# Patient Record
Sex: Female | Born: 1960 | Race: White | Hispanic: No | Marital: Married | Smoking: Never smoker
Health system: Southern US, Community
[De-identification: ages and names within clinical notes are randomized; demographics above are authoritative.]

## PROBLEM LIST (undated history)

## (undated) DIAGNOSIS — K219 Gastro-esophageal reflux disease without esophagitis: Secondary | ICD-10-CM

## (undated) DIAGNOSIS — B019 Varicella without complication: Secondary | ICD-10-CM

## (undated) DIAGNOSIS — T8859XA Other complications of anesthesia, initial encounter: Secondary | ICD-10-CM

## (undated) DIAGNOSIS — K563 Gallstone ileus: Secondary | ICD-10-CM

## (undated) DIAGNOSIS — K5900 Constipation, unspecified: Secondary | ICD-10-CM

## (undated) DIAGNOSIS — T4145XA Adverse effect of unspecified anesthetic, initial encounter: Secondary | ICD-10-CM

## (undated) DIAGNOSIS — M1990 Unspecified osteoarthritis, unspecified site: Secondary | ICD-10-CM

## (undated) DIAGNOSIS — E669 Obesity, unspecified: Secondary | ICD-10-CM

## (undated) DIAGNOSIS — I10 Essential (primary) hypertension: Secondary | ICD-10-CM

## (undated) HISTORY — DX: Gastro-esophageal reflux disease without esophagitis: K21.9

## (undated) HISTORY — PX: HERNIA REPAIR: SHX51

## (undated) HISTORY — PX: WISDOM TOOTH EXTRACTION: SHX21

## (undated) HISTORY — PX: TONSILLECTOMY: SUR1361

## (undated) HISTORY — DX: Varicella without complication: B01.9

---

## 2004-03-19 LAB — HM PAP SMEAR: HM Pap smear: NORMAL

## 2015-01-28 ENCOUNTER — Encounter (HOSPITAL_COMMUNITY): Payer: Self-pay | Admitting: Emergency Medicine

## 2015-01-28 ENCOUNTER — Emergency Department (HOSPITAL_COMMUNITY)
Admission: EM | Admit: 2015-01-28 | Discharge: 2015-01-28 | Disposition: A | Discharge status: Home / Self Care | Payer: BLUE CROSS/BLUE SHIELD | Attending: Emergency Medicine | Admitting: Emergency Medicine

## 2015-01-28 DIAGNOSIS — R1011 Right upper quadrant pain: Secondary | ICD-10-CM | POA: Insufficient documentation

## 2015-01-28 DIAGNOSIS — R1010 Upper abdominal pain, unspecified: Secondary | ICD-10-CM

## 2015-01-28 DIAGNOSIS — R109 Unspecified abdominal pain: Secondary | ICD-10-CM | POA: Diagnosis present

## 2015-01-28 DIAGNOSIS — R1114 Bilious vomiting: Secondary | ICD-10-CM | POA: Diagnosis not present

## 2015-01-28 DIAGNOSIS — R1012 Left upper quadrant pain: Secondary | ICD-10-CM | POA: Diagnosis not present

## 2015-01-28 LAB — CBC WITH DIFFERENTIAL/PLATELET
Basophils Absolute: 0 10*3/uL (ref 0.0–0.1)
Basophils Relative: 0 % (ref 0–1)
Eosinophils Absolute: 0 10*3/uL (ref 0.0–0.7)
Eosinophils Relative: 1 % (ref 0–5)
HCT: 46 % (ref 36.0–46.0)
Hemoglobin: 15.9 g/dL — ABNORMAL HIGH (ref 12.0–15.0)
Lymphocytes Relative: 12 % (ref 12–46)
Lymphs Abs: 0.9 10*3/uL (ref 0.7–4.0)
MCH: 31.4 pg (ref 26.0–34.0)
MCHC: 34.6 g/dL (ref 30.0–36.0)
MCV: 90.9 fL (ref 78.0–100.0)
MONOS PCT: 10 % (ref 3–12)
Monocytes Absolute: 0.8 10*3/uL (ref 0.1–1.0)
Neutro Abs: 5.7 10*3/uL (ref 1.7–7.7)
Neutrophils Relative %: 77 % (ref 43–77)
Platelets: 226 10*3/uL (ref 150–400)
RBC: 5.06 MIL/uL (ref 3.87–5.11)
RDW: 12.8 % (ref 11.5–15.5)
Smear Review: ADEQUATE
WBC: 7.4 10*3/uL (ref 4.0–10.5)

## 2015-01-28 LAB — COMPREHENSIVE METABOLIC PANEL
ALBUMIN: 4.7 g/dL (ref 3.5–5.2)
ALK PHOS: 74 U/L (ref 39–117)
ALT: 32 U/L (ref 0–35)
AST: 30 U/L (ref 0–37)
Anion gap: 11 (ref 5–15)
BILIRUBIN TOTAL: 1.1 mg/dL (ref 0.3–1.2)
BUN: 23 mg/dL (ref 6–23)
CALCIUM: 10.6 mg/dL — AB (ref 8.4–10.5)
CHLORIDE: 101 mmol/L (ref 96–112)
CO2: 26 mmol/L (ref 19–32)
Creatinine, Ser: 0.85 mg/dL (ref 0.50–1.10)
GFR calc Af Amer: 89 mL/min — ABNORMAL LOW (ref >90)
GFR calc non Af Amer: 77 mL/min — ABNORMAL LOW (ref >90)
Glucose, Bld: 175 mg/dL — ABNORMAL HIGH (ref 70–99)
Potassium: 3.6 mmol/L (ref 3.5–5.1)
Sodium: 138 mmol/L (ref 135–145)
Total Protein: 8.8 g/dL — ABNORMAL HIGH (ref 6.0–8.3)

## 2015-01-28 LAB — URINALYSIS, ROUTINE W REFLEX MICROSCOPIC
Glucose, UA: NEGATIVE mg/dL
Leukocytes, UA: NEGATIVE
Nitrite: NEGATIVE
Protein, ur: 30 mg/dL — AB
SPECIFIC GRAVITY, URINE: 1.025 (ref 1.005–1.030)
Urobilinogen, UA: 0.2 mg/dL (ref 0.0–1.0)
pH: 6 (ref 5.0–8.0)

## 2015-01-28 LAB — URINE MICROSCOPIC-ADD ON

## 2015-01-28 LAB — LIPASE, BLOOD: Lipase: 21 U/L (ref 11–59)

## 2015-01-28 MED ORDER — ONDANSETRON HCL 8 MG PO TABS: 8.0000 mg | ORAL_TABLET | Freq: Three times a day (TID) | ORAL | Status: DC | PRN

## 2015-01-28 MED ORDER — ONDANSETRON HCL 4 MG/2ML IJ SOLN
4.0000 mg | Freq: Once | INTRAMUSCULAR | Status: AC
  Administered 2015-01-28: 4 mg via INTRAVENOUS
  Filled 2015-01-28: qty 2

## 2015-01-28 MED ORDER — SODIUM CHLORIDE 0.9 % IV SOLN
INTRAVENOUS | Status: DC
  Administered 2015-01-28: 10:00:00 via INTRAVENOUS

## 2015-01-28 MED ORDER — SODIUM CHLORIDE 0.9 % IV BOLUS (SEPSIS)
1000.0000 mL | Freq: Once | INTRAVENOUS | Status: AC
  Administered 2015-01-28: 1000 mL via INTRAVENOUS

## 2015-01-28 NOTE — Discharge Instructions (Signed)
Start with clear liquids, then gradually advance her diet over a couple of days. Use Tylenol, for pain or fever. Return here, if needed, for problems.    Abdominal Pain Many things can cause abdominal pain. Usually, abdominal pain is not caused by a disease and will improve without treatment. It can often be observed and treated at home. Your health care provider will do a physical exam and possibly order blood tests and X-rays to help determine the seriousness of your pain. However, in many cases, more time must pass before a clear cause of the pain can be found. Before that point, your health care provider may not know if you need more testing or further treatment. HOME CARE INSTRUCTIONS  Monitor your abdominal pain for any changes. The following actions may help to alleviate any discomfort you are experiencing:  Only take over-the-counter or prescription medicines as directed by your health care provider.  Do not take laxatives unless directed to do so by your health care provider.  Try a clear liquid diet (broth, tea, or water) as directed by your health care provider. Slowly move to a bland diet as tolerated. SEEK MEDICAL CARE IF:  You have unexplained abdominal pain.  You have abdominal pain associated with nausea or diarrhea.  You have pain when you urinate or have a bowel movement.  You experience abdominal pain that wakes you in the night.  You have abdominal pain that is worsened or improved by eating food.  You have abdominal pain that is worsened with eating fatty foods.  You have a fever. SEEK IMMEDIATE MEDICAL CARE IF:   Your pain does not go away within 2 hours.  You keep throwing up (vomiting).  Your pain is felt only in portions of the abdomen, such as the right side or the left lower portion of the abdomen.  You pass bloody or black tarry stools. MAKE SURE YOU:  Understand these instructions.   Will watch your condition.   Will get help right away if  you are not doing well or get worse.  Document Released: 09/04/2005 Document Revised: 11/30/2013 Document Reviewed: 08/04/2013 [facility] Patient Information 2015 [place], [place]. This information is not intended to replace advice given to you by your health care provider. Make sure you discuss any questions you have with your health care provider.  Nausea and Vomiting Nausea is a sick feeling that often comes before throwing up (vomiting). Vomiting is a reflex where stomach contents come out of your mouth. Vomiting can cause severe loss of body fluids (dehydration). Children and elderly adults can become dehydrated quickly, especially if they also have diarrhea. Nausea and vomiting are symptoms of a condition or disease. It is important to find the cause of your symptoms. CAUSES   Direct irritation of the stomach lining. This irritation can result from increased acid production (gastroesophageal reflux disease), infection, food poisoning, taking certain medicines (such as nonsteroidal anti-inflammatory drugs), alcohol use, or tobacco use.  Signals from the brain.These signals could be caused by a headache, heat exposure, an inner ear disturbance, increased pressure in the brain from injury, infection, a tumor, or a concussion, pain, emotional stimulus, or metabolic problems.  An obstruction in the gastrointestinal tract (bowel obstruction).  Illnesses such as diabetes, hepatitis, gallbladder problems, appendicitis, kidney problems, cancer, sepsis, atypical symptoms of a heart attack, or eating disorders.  Medical treatments such as chemotherapy and radiation.  Receiving medicine that makes you sleep (general anesthetic) during surgery. DIAGNOSIS Your caregiver may ask for tests  to be done if the problems do not improve after a few days. Tests may also be done if symptoms are severe or if the reason for the nausea and vomiting is not clear. Tests may include:  Urine tests.  Blood  tests.  Stool tests.  Cultures (to look for evidence of infection).  X-rays or other imaging studies. Test results can help your caregiver make decisions about treatment or the need for additional tests. TREATMENT You need to stay well hydrated. Drink frequently but in small amounts.You may wish to drink water, sports drinks, clear broth, or eat frozen ice pops or gelatin dessert to help stay hydrated.When you eat, eating slowly may help prevent nausea.There are also some antinausea medicines that may help prevent nausea. HOME CARE INSTRUCTIONS   Take all medicine as directed by your caregiver.  If you do not have an appetite, do not force yourself to eat. However, you must continue to drink fluids.  If you have an appetite, eat a normal diet unless your caregiver tells you differently.  Eat a variety of complex carbohydrates (rice, wheat, potatoes, bread), lean meats, yogurt, fruits, and vegetables.  Avoid high-fat foods because they are more difficult to digest.  Drink enough water and fluids to keep your urine clear or pale yellow.  If you are dehydrated, ask your caregiver for specific rehydration instructions. Signs of dehydration may include:  Severe thirst.  Dry lips and mouth.  Dizziness.  Dark urine.  Decreasing urine frequency and amount.  Confusion.  Rapid breathing or pulse. SEEK IMMEDIATE MEDICAL CARE IF:   You have blood or brown flecks (like coffee grounds) in your vomit.  You have black or bloody stools.  You have a severe headache or stiff neck.  You are confused.  You have severe abdominal pain.  You have chest pain or trouble breathing.  You do not urinate at least once every 8 hours.  You develop cold or clammy skin.  You continue to vomit for longer than 24 to 48 hours.  You have a fever. MAKE SURE YOU:   Understand these instructions.  Will watch your condition.  Will get help right away if you are not doing well or get  worse. Document Released: 11/25/2005 Document Revised: 02/17/2012 Document Reviewed: 04/24/2011 [facility] Patient Information 2015 [place], [place]. This information is not intended to replace advice given to you by your health care provider. Make sure you discuss any questions you have with your health care provider.

## 2015-01-28 NOTE — ED Notes (Signed)
Patient c/o mid abd pain that started yesterday. Per patient started as cramping in upper abd yesterday which easied last night. Patient started having pain again with vomiting this morning at 2 am. Patient denies any diarrhea, fevers, or urinary symptoms. Per patient vomiting large amounts of bile.

## 2015-01-28 NOTE — ED Provider Notes (Signed)
CSN: [id number]     Arrival date & time 01/28/15  8101 History  This chart was scribed for Richarda Blade, MD by Jeanell Sparrow, ED Scribe. This patient was seen in room APA06/APA06 and the patient's care was started at 7:37 AM.    Chief Complaint  Patient presents with  . Abdominal Pain   The history is provided by the patient. No language interpreter was used.   HPI Comments: Miranda Aguilar is a 54 y.o. female who presents to the Emergency Department complaining of constant moderate middle abdominal pain that started yesterday. She states that her pain has been waxing and waning with 2 episodes of vomiting. She reports that he pain radiate to her back and shoulders. She reports that her emesis was mainly bile. She reports that throwing up provides relief from her abdominal pain. She denies any prior hx of abdominal pain, or abdominal surgeries. She reports no sick contacts, or hx of smoking. 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 She denies any diarrhea or fever.   History reviewed. No pertinent past medical history. History reviewed. No pertinent past surgical history. Family History  Problem Relation Age of Onset  . Cancer Mother   . Diabetes Mother   . Cancer Father   . Stroke Father    History  Substance Use Topics  . Smoking status: Never Smoker   . Smokeless tobacco: Never Used  . Alcohol Use: No     Comment: ocassional   OB History    Gravida Para Term Preterm AB TAB SAB Ectopic Multiple Living            0     Review of Systems  Constitutional: Negative for fever.  Gastrointestinal: Positive for vomiting and abdominal pain. Negative for diarrhea.  All other systems reviewed and are negative.   Allergies  Review of patient's allergies indicates no known allergies.  Home Medications   Prior to Admission medications   Medication Sig Start Date End Date Taking? Authorizing Provider  ibuprofen (ADVIL,MOTRIN) 200 MG tablet Take 400 mg by mouth every 8  (eight) hours as needed (pain).   Yes Historical Provider, MD  ondansetron (ZOFRAN) 8 MG tablet Take 1 tablet (8 mg total) by mouth every 8 (eight) hours as needed for nausea or vomiting. 01/28/15   Richarda Blade, MD   BP 136/102 mmHg  Pulse 104  Temp(Src) 97.9 F (36.6 C) (Oral)  Resp 22  Ht 5\' 6"  (1.676 m)  Wt 260 lb (117.935 kg)  BMI 41.99 kg/m2  SpO2 96%  LMP 11/22/2014 Physical Exam  Constitutional: She is oriented to person, place, and time. She appears well-developed and well-nourished.  HENT:  Head: Normocephalic and atraumatic.  Mouth/Throat: Oropharynx is clear and moist.  Eyes: Conjunctivae and EOM are normal. Pupils are equal, round, and reactive to light.  Neck: Normal range of motion and phonation normal. Neck supple.  Cardiovascular: Normal rate and regular rhythm.   Pulmonary/Chest: Effort normal and breath sounds normal. She exhibits no tenderness.  Abdominal: Soft. She exhibits no distension and no mass. There is tenderness. There is no rebound and no guarding.  Hypoactive bowel sounds. Mild bilateral upper quadrant TTP.  Genitourinary:  No CVA TTP.   Musculoskeletal: Normal range of motion.  Neurological: She is alert and oriented to person, place, and time. She exhibits normal muscle tone.  Skin: Skin is warm and dry.  Psychiatric: She has a normal mood and affect. Her behavior is normal. Judgment  and thought content normal.  Nursing note and vitals reviewed.   ED Course  Procedures (including critical care time)  Medications  sodium chloride 0.9 % bolus 1,000 mL (0 mLs Intravenous Stopped 01/28/15 0930)  ondansetron (ZOFRAN) injection 4 mg (4 mg Intravenous Given 01/28/15 0802)    Patient Vitals for the past 24 hrs:  BP Temp Temp src Pulse Resp SpO2 Height Weight  01/28/15 1057 131/80 mmHg 97.8 F (36.6 C) Oral 84 18 97 % - -  01/28/15 1000 137/89 mmHg - - 79 - 96 % - -  01/28/15 0930 140/90 mmHg - - 83 - 96 % - -  01/28/15 0900 146/84 mmHg - - 81 -  94 % - -  01/28/15 0840 145/82 mmHg - - 81 18 95 % - -  01/28/15 0657 (!) 136/102 mmHg 97.9 F (36.6 C) Oral 104 22 96 % 5\' 6"  (1.676 m) 260 lb (117.935 kg)    9:35 AM Reevaluation with update and discussion. After initial assessment and treatment, an updated evaluation reveals she has, trauma, now.  She complains of mild soreness in the upper abdomen.  She feels like she is ready to try an oral fluid challenge. Suliman Termini L    DIAGNOSTIC STUDIES: Oxygen Saturation is 96% on RA, normal by my interpretation.    COORDINATION OF CARE: 7:41 AM- Pt advised of plan for treatment which includes medication and labs and pt agrees.  Labs Review Labs Reviewed  CBC WITH DIFFERENTIAL/PLATELET - Abnormal; Notable for the following:    Hemoglobin 15.9 (*)    All other components within normal limits  COMPREHENSIVE METABOLIC PANEL - Abnormal; Notable for the following:    Glucose, Bld 175 (*)    Calcium 10.6 (*)    Total Protein 8.8 (*)    GFR calc non Af Amer 77 (*)    GFR calc Af Amer 89 (*)    All other components within normal limits  URINALYSIS, ROUTINE W REFLEX MICROSCOPIC - Abnormal; Notable for the following:    Color, Urine AMBER (*)    APPearance CLOUDY (*)    Hgb urine dipstick TRACE (*)    Bilirubin Urine SMALL (*)    Ketones, ur TRACE (*)    Protein, ur 30 (*)    All other components within normal limits  URINE MICROSCOPIC-ADD ON - Abnormal; Notable for the following:    Squamous Epithelial / LPF FEW (*)    Bacteria, UA FEW (*)    Casts GRANULAR CAST (*)    All other components within normal limits  LIPASE, BLOOD    Imaging Review No results found.   EKG Interpretation None      MDM   Final diagnoses:  Bilious vomiting with nausea  Upper abdominal pain    Nonspecific, nausea and vomiting, possible early gastroenteritis.  Doubt gallbladder disease, kidney infection or metabolic instability.   Nursing Notes Reviewed/ Care Coordinated Applicable Imaging  Reviewed Interpretation of Laboratory Data incorporated into ED treatment  The patient appears reasonably screened and/or stabilized for discharge and I doubt any other medical condition or other [facility] requiring further screening, evaluation, or treatment in the ED at this time prior to discharge.  Plan: Home Medications- Zofran; Home Treatments- rest; return here if the recommended treatment, does not improve the symptoms; Recommended follow up- PCP prn  I personally performed the services described in this documentation, which was scribed in my presence. The recorded information has been reviewed and is accurate.  Richarda Blade, MD 01/28/15 [phone]

## 2015-01-29 ENCOUNTER — Encounter (HOSPITAL_COMMUNITY): Payer: Self-pay | Admitting: Emergency Medicine

## 2015-01-29 ENCOUNTER — Emergency Department (HOSPITAL_COMMUNITY): Payer: BLUE CROSS/BLUE SHIELD

## 2015-01-29 ENCOUNTER — Inpatient Hospital Stay (HOSPITAL_COMMUNITY)
Admission: EM | Admit: 2015-01-29 | Discharge: 2015-02-05 | DRG: 348 | Disposition: A | Discharge status: Home / Self Care | Payer: BLUE CROSS/BLUE SHIELD | Attending: General Surgery | Admitting: General Surgery

## 2015-01-29 DIAGNOSIS — Z823 Family history of stroke: Secondary | ICD-10-CM

## 2015-01-29 DIAGNOSIS — K429 Umbilical hernia without obstruction or gangrene: Secondary | ICD-10-CM | POA: Diagnosis present

## 2015-01-29 DIAGNOSIS — K563 Gallstone ileus: Secondary | ICD-10-CM | POA: Diagnosis present

## 2015-01-29 DIAGNOSIS — I10 Essential (primary) hypertension: Secondary | ICD-10-CM

## 2015-01-29 DIAGNOSIS — Z833 Family history of diabetes mellitus: Secondary | ICD-10-CM | POA: Diagnosis not present

## 2015-01-29 DIAGNOSIS — Q430 Meckel's diverticulum (displaced) (hypertrophic): Secondary | ICD-10-CM

## 2015-01-29 DIAGNOSIS — E669 Obesity, unspecified: Secondary | ICD-10-CM | POA: Diagnosis present

## 2015-01-29 DIAGNOSIS — R739 Hyperglycemia, unspecified: Secondary | ICD-10-CM | POA: Diagnosis present

## 2015-01-29 DIAGNOSIS — R52 Pain, unspecified: Secondary | ICD-10-CM

## 2015-01-29 DIAGNOSIS — R109 Unspecified abdominal pain: Secondary | ICD-10-CM

## 2015-01-29 DIAGNOSIS — R1010 Upper abdominal pain, unspecified: Secondary | ICD-10-CM

## 2015-01-29 DIAGNOSIS — Z6841 Body Mass Index (BMI) 40.0 and over, adult: Secondary | ICD-10-CM

## 2015-01-29 DIAGNOSIS — K56609 Unspecified intestinal obstruction, unspecified as to partial versus complete obstruction: Secondary | ICD-10-CM

## 2015-01-29 DIAGNOSIS — E860 Dehydration: Secondary | ICD-10-CM | POA: Diagnosis present

## 2015-01-29 DIAGNOSIS — R112 Nausea with vomiting, unspecified: Secondary | ICD-10-CM | POA: Diagnosis present

## 2015-01-29 DIAGNOSIS — N179 Acute kidney failure, unspecified: Secondary | ICD-10-CM | POA: Diagnosis present

## 2015-01-29 DIAGNOSIS — E876 Hypokalemia: Secondary | ICD-10-CM | POA: Diagnosis not present

## 2015-01-29 HISTORY — DX: Essential (primary) hypertension: I10

## 2015-01-29 HISTORY — DX: Obesity, unspecified: E66.9

## 2015-01-29 HISTORY — DX: Gallstone ileus: K56.3

## 2015-01-29 LAB — URINALYSIS, ROUTINE W REFLEX MICROSCOPIC
Glucose, UA: NEGATIVE mg/dL
KETONES UR: NEGATIVE mg/dL
Leukocytes, UA: NEGATIVE
Nitrite: NEGATIVE
PH: 5.5 (ref 5.0–8.0)
Protein, ur: >300 mg/dL — AB
Urobilinogen, UA: 0.2 mg/dL (ref 0.0–1.0)

## 2015-01-29 LAB — URINE MICROSCOPIC-ADD ON

## 2015-01-29 LAB — CBC WITH DIFFERENTIAL/PLATELET
Basophils Absolute: 0 10*3/uL (ref 0.0–0.1)
Basophils Relative: 0 % (ref 0–1)
Eosinophils Absolute: 0 10*3/uL (ref 0.0–0.7)
Eosinophils Relative: 0 % (ref 0–5)
HCT: 50 % — ABNORMAL HIGH (ref 36.0–46.0)
Hemoglobin: 17.5 g/dL — ABNORMAL HIGH (ref 12.0–15.0)
LYMPHS ABS: 0.9 10*3/uL (ref 0.7–4.0)
LYMPHS PCT: 25 % (ref 12–46)
MCH: 31.6 pg (ref 26.0–34.0)
MCHC: 35 g/dL (ref 30.0–36.0)
MCV: 90.3 fL (ref 78.0–100.0)
MONO ABS: 0.6 10*3/uL (ref 0.1–1.0)
Monocytes Relative: 17 % — ABNORMAL HIGH (ref 3–12)
NEUTROS PCT: 58 % (ref 43–77)
Neutro Abs: 2.1 10*3/uL (ref 1.7–7.7)
PLATELETS: 297 10*3/uL (ref 150–400)
RBC: 5.54 MIL/uL — ABNORMAL HIGH (ref 3.87–5.11)
RDW: 12.8 % (ref 11.5–15.5)
WBC: 3.7 10*3/uL — ABNORMAL LOW (ref 4.0–10.5)

## 2015-01-29 LAB — COMPREHENSIVE METABOLIC PANEL
ALK PHOS: 68 U/L (ref 39–117)
ALT: 28 U/L (ref 0–35)
AST: 22 U/L (ref 0–37)
Albumin: 5 g/dL (ref 3.5–5.2)
Anion gap: 15 (ref 5–15)
BUN: 46 mg/dL — ABNORMAL HIGH (ref 6–23)
CALCIUM: 10.6 mg/dL — AB (ref 8.4–10.5)
CHLORIDE: 88 mmol/L — AB (ref 96–112)
CO2: 33 mmol/L — ABNORMAL HIGH (ref 19–32)
Creatinine, Ser: 2.78 mg/dL — ABNORMAL HIGH (ref 0.50–1.10)
GFR calc Af Amer: 21 mL/min — ABNORMAL LOW (ref >90)
GFR calc non Af Amer: 18 mL/min — ABNORMAL LOW (ref >90)
Glucose, Bld: 205 mg/dL — ABNORMAL HIGH (ref 70–99)
Potassium: 3.1 mmol/L — ABNORMAL LOW (ref 3.5–5.1)
SODIUM: 136 mmol/L (ref 135–145)
TOTAL PROTEIN: 10.2 g/dL — AB (ref 6.0–8.3)
Total Bilirubin: 1 mg/dL (ref 0.3–1.2)

## 2015-01-29 MED ORDER — PIPERACILLIN-TAZOBACTAM 3.375 G IVPB 30 MIN
3.3750 g | Freq: Once | INTRAVENOUS | Status: AC
  Administered 2015-01-29: 3.375 g via INTRAVENOUS
  Filled 2015-01-29: qty 50

## 2015-01-29 MED ORDER — PIPERACILLIN-TAZOBACTAM 3.375 G IVPB
INTRAVENOUS | Status: AC
  Filled 2015-01-29: qty 50

## 2015-01-29 MED ORDER — MORPHINE SULFATE 4 MG/ML IJ SOLN
4.0000 mg | INTRAMUSCULAR | Status: DC | PRN
  Administered 2015-01-29 – 2015-01-30 (×3): 4 mg via INTRAVENOUS
  Filled 2015-01-29 (×3): qty 1

## 2015-01-29 MED ORDER — ONDANSETRON HCL 4 MG PO TABS
4.0000 mg | ORAL_TABLET | Freq: Four times a day (QID) | ORAL | Status: DC | PRN
  Filled 2015-01-29: qty 1

## 2015-01-29 MED ORDER — SODIUM CHLORIDE 0.9 % IV SOLN
INTRAVENOUS | Status: DC
Start: 2015-01-29 — End: 2015-01-31
  Administered 2015-01-29 – 2015-01-31 (×4): via INTRAVENOUS

## 2015-01-29 MED ORDER — IOHEXOL 300 MG/ML  SOLN
100.0000 mL | Freq: Once | INTRAMUSCULAR | Status: AC | PRN
  Administered 2015-01-29: 100 mL via INTRAVENOUS

## 2015-01-29 MED ORDER — SODIUM CHLORIDE 0.9 % IV SOLN: INTRAVENOUS | Status: DC

## 2015-01-29 MED ORDER — HYDROMORPHONE HCL 1 MG/ML IJ SOLN
1.0000 mg | Freq: Once | INTRAMUSCULAR | Status: AC
Start: 2015-01-29 — End: 2015-01-29
  Administered 2015-01-29: 1 mg via INTRAVENOUS
  Filled 2015-01-29: qty 1

## 2015-01-29 MED ORDER — CETYLPYRIDINIUM CHLORIDE 0.05 % MT LIQD
7.0000 mL | Freq: Two times a day (BID) | OROMUCOSAL | Status: DC
  Administered 2015-01-29 – 2015-02-05 (×13): 7 mL via OROMUCOSAL

## 2015-01-29 MED ORDER — POTASSIUM CHLORIDE CRYS ER 20 MEQ PO TBCR
40.0000 meq | EXTENDED_RELEASE_TABLET | Freq: Once | ORAL | Status: AC
  Administered 2015-01-29: 40 meq via ORAL

## 2015-01-29 MED ORDER — PANTOPRAZOLE SODIUM 40 MG IV SOLR
40.0000 mg | Freq: Once | INTRAVENOUS | Status: AC
  Administered 2015-01-29: 40 mg via INTRAVENOUS
  Filled 2015-01-29: qty 40

## 2015-01-29 MED ORDER — PIPERACILLIN-TAZOBACTAM 3.375 G IVPB
3.3750 g | Freq: Three times a day (TID) | INTRAVENOUS | Status: DC
  Administered 2015-01-30 – 2015-02-04 (×15): 3.375 g via INTRAVENOUS
  Filled 2015-01-29 (×23): qty 50

## 2015-01-29 MED ORDER — ONDANSETRON HCL 4 MG/2ML IJ SOLN
4.0000 mg | Freq: Once | INTRAMUSCULAR | Status: AC
  Administered 2015-01-29: 4 mg via INTRAVENOUS
  Filled 2015-01-29: qty 2

## 2015-01-29 MED ORDER — ENOXAPARIN SODIUM 30 MG/0.3ML ~~LOC~~ SOLN
30.0000 mg | SUBCUTANEOUS | Status: DC
  Administered 2015-01-29: 30 mg via SUBCUTANEOUS
  Filled 2015-01-29: qty 0.3

## 2015-01-29 MED ORDER — IOHEXOL 300 MG/ML  SOLN
25.0000 mL | Freq: Once | INTRAMUSCULAR | Status: AC | PRN
  Administered 2015-01-29: 25 mL via ORAL

## 2015-01-29 MED ORDER — ONDANSETRON HCL 4 MG/2ML IJ SOLN
4.0000 mg | Freq: Four times a day (QID) | INTRAMUSCULAR | Status: DC | PRN
  Administered 2015-01-29 – 2015-02-04 (×11): 4 mg via INTRAVENOUS
  Filled 2015-01-29 (×13): qty 2

## 2015-01-29 NOTE — ED Notes (Signed)
Having vomiting and abdominal pain for last 2 days.  Rates 5.  Took Zofran with no relief.

## 2015-01-29 NOTE — Progress Notes (Signed)
ANTIBIOTIC CONSULT NOTE - INITIAL  Pharmacy Consult for Zosyn Indication: Intra abdominal infection  No Known Allergies  Patient Measurements: Height: 5\' 6"  (167.6 cm) Weight: 260 lb (117.935 kg) IBW/kg (Calculated) : 59.3 Adjusted Body Weight:   Vital Signs: Temp: 97.5 F (36.4 C) (02/21 1709) Temp Source: Oral (02/21 1709) BP: 124/105 mmHg (02/21 1940) Pulse Rate: 106 (02/21 1940) Intake/Output from previous day:   Intake/Output from this shift:    Labs:  Recent Labs  01/28/15 0720 01/29/15 1920  WBC 7.4 3.7*  HGB 15.9* 17.5*  PLT 226 297  CREATININE 0.85 2.78*   Estimated Creatinine Clearance: 30.6 mL/min (by C-G formula based on Cr of 2.78). No results for input(s): VANCOTROUGH, VANCOPEAK, VANCORANDOM, GENTTROUGH, GENTPEAK, GENTRANDOM, TOBRATROUGH, TOBRAPEAK, TOBRARND, AMIKACINPEAK, AMIKACINTROU, AMIKACIN in the last 72 hours.   Microbiology: No results found for this or any previous visit (from the past 720 hour(s)).  Medical History: History reviewed. No pertinent past medical history.  Medications:   (Not in a hospital admission) Assessment: History of abdominal pain associated with vomiting. Evaluation in the emergency room showed that there appears to be a gallstone in the small bowel causing a degree of obstruction. Surgery consulted. CrCl > 20 ml/min Zosyn 3.375 GM IV  X 1 dose given  Goal of Therapy:  Eradicate infection  Plan:  Zosyn 3.375 GM IV every 8 hours, infuse each dose over 4 hours Monitor renal function Labs per protocol   Abner Greenspan, Karron Alvizo Bennett 01/29/2015,10:09 PM

## 2015-01-29 NOTE — ED Provider Notes (Signed)
CSN: [id number]     Arrival date & time 01/29/15  1705 History   This chart was scribed for Maudry Diego, MD by Randa Evens, ED Scribe. This patient was seen in room APA12/APA12 and the patient's care was started at 7:05 PM.      Chief Complaint  Patient presents with  . Emesis  . Abdominal Pain    Patient is a 54 y.o. female presenting with vomiting and abdominal pain. The history is provided by the patient. No language interpreter was used.  Emesis Severity:  Moderate Duration:  2 days Timing:  Intermittent Progression:  Unchanged Relieved by:  Nothing Worsened by:  Nothing tried Ineffective treatments:  Antiemetics Associated symptoms: abdominal pain   Associated symptoms: no diarrhea, no fever and no headaches   Abdominal Pain Associated symptoms: vomiting   Associated symptoms: no chest pain, no cough, no diarrhea, no fatigue and no hematuria    HPI Comments: Miranda Aguilar is a 54 y.o. female who presents to the Emergency Department complaining of un improving  vomiting onset 2 days prior. Pt states she has associated abdominal pain. Pt states she was prescribed Zofran that has not provided any relief. Pt denies diarrhea or other related symptoms.   History reviewed. No pertinent past medical history. History reviewed. No pertinent past surgical history. Family History  Problem Relation Age of Onset  . Cancer Mother   . Diabetes Mother   . Cancer Father   . Stroke Father    History  Substance Use Topics  . Smoking status: Never Smoker   . Smokeless tobacco: Never Used  . Alcohol Use: No     Comment: ocassional   OB History    Gravida Para Term Preterm AB TAB SAB Ectopic Multiple Living            0      Review of Systems  Constitutional: Negative for appetite change and fatigue.  HENT: Negative for congestion, ear discharge and sinus pressure.   Eyes: Negative for discharge.  Respiratory: Negative for cough.   Cardiovascular: Negative for chest  pain.  Gastrointestinal: Positive for vomiting and abdominal pain. Negative for diarrhea.  Genitourinary: Negative for frequency and hematuria.  Musculoskeletal: Negative for back pain.  Skin: Negative for rash.  Neurological: Negative for seizures and headaches.  Psychiatric/Behavioral: Negative for hallucinations.      Allergies  Review of patient's allergies indicates no known allergies.  Home Medications   Prior to Admission medications   Medication Sig Start Date End Date Taking? Authorizing Provider  ibuprofen (ADVIL,MOTRIN) 200 MG tablet Take 400 mg by mouth every 8 (eight) hours as needed (pain).    Historical Provider, MD  ondansetron (ZOFRAN) 8 MG tablet Take 1 tablet (8 mg total) by mouth every 8 (eight) hours as needed for nausea or vomiting. 01/28/15   Richarda Blade, MD   BP 139/103 mmHg  Pulse 117  Temp(Src) 97.5 F (36.4 C) (Oral)  Resp 19  Ht 5\' 6"  (1.676 m)  Wt 260 lb (117.935 kg)  BMI 41.99 kg/m2  SpO2 94%  LMP 11/22/2014   Physical Exam  Constitutional: She is oriented to person, place, and time. She appears well-developed.  HENT:  Head: Normocephalic.  Eyes: Conjunctivae and EOM are normal. No scleral icterus.  Neck: Neck supple. No thyromegaly present.  Cardiovascular: Normal rate and regular rhythm.  Exam reveals no gallop and no friction rub.   No murmur heard. Pulmonary/Chest: No stridor. She has no wheezes. She has  no rales. She exhibits no tenderness.  Abdominal: She exhibits no distension. There is tenderness (mild) in the epigastric area. There is no rebound.  Musculoskeletal: Normal range of motion. She exhibits no edema.  Lymphadenopathy:    She has no cervical adenopathy.  Neurological: She is oriented to person, place, and time. She exhibits normal muscle tone. Coordination normal.  Skin: No rash noted. No erythema.  Psychiatric: She has a normal mood and affect. Her behavior is normal.  Nursing note and vitals reviewed.   ED Course   Procedures (including critical care time) DIAGNOSTIC STUDIES: Oxygen Saturation is 94% on RA, adequate by my interpretation.    COORDINATION OF CARE: 7:10 PM-Discussed treatment plan with pt at bedside and pt agreed to plan.     Labs Review Labs Reviewed  CBC WITH DIFFERENTIAL/PLATELET  BASIC METABOLIC PANEL  URINALYSIS, ROUTINE W REFLEX MICROSCOPIC    Imaging Review No results found.   EKG Interpretation None      MDM   Final diagnoses:  None      Gall stone in small bowl,   Admit to medicne,  Surgery consulted   The chart was scribed for me under my direct supervision.  I personally performed the history, physical, and medical decision making and all procedures in the evaluation of this patient.Maudry Diego, MD 01/29/15 [phone]

## 2015-01-29 NOTE — H&P (Signed)
[facility] History and Physical  Miranda Aguilar DZH:[id number] DOB: February 26, 1961 DOA: 01/29/2015  Referring physician: ER PCP: No PCP Per Patient   Chief Complaint: Nausea, vomiting, abdominal pain  HPI: Miranda Aguilar is a 54 y.o. female  This is a 54 year old lady who gives a 2-1/2 day history of abdominal pain associated with vomiting. The abdominal pain is colicky in nature, intermittent throughout the last couple of days and she has been vomiting also. There has been no diarrhea or fever. No headaches. She doesn't seem to be improving. There is no chest pain or dyspnea. Evaluation in the emergency room showed that there appears to be a gallstone in the small bowel causing a degree of obstruction. She is now being admitted for further management.   Review of Systems:  Apart from symptoms above, all systems negative.  History reviewed. No pertinent past medical history. History reviewed. No pertinent past surgical history. Social History:  reports that she has never smoked. She has never used smokeless tobacco. She reports that she does not drink alcohol or use illicit drugs.  No Known Allergies  Family History  Problem Relation Age of Onset  . Cancer Mother   . Diabetes Mother   . Cancer Father   . Stroke Father      Prior to Admission medications   Medication Sig Start Date End Date Taking? Authorizing Provider  ibuprofen (ADVIL,MOTRIN) 200 MG tablet Take 400 mg by mouth every 8 (eight) hours as needed (pain).   Yes Historical Provider, MD  ondansetron (ZOFRAN) 8 MG tablet Take 1 tablet (8 mg total) by mouth every 8 (eight) hours as needed for nausea or vomiting. 01/28/15  Yes Richarda Blade, MD   Physical Exam: Filed Vitals:   01/29/15 1709 01/29/15 1940  BP: 139/103 124/105  Pulse: 117 106  Temp: 97.5 F (36.4 C)   TempSrc: Oral   Resp: 19 24  Height: 5\' 6"  (1.676 m)   Weight: 117.935 kg (260 lb)   SpO2: 94% 94%    Wt Readings from Last 3 Encounters:    01/29/15 117.935 kg (260 lb)  01/28/15 117.935 kg (260 lb)    General:  Appears in some discomfort although she has had analgesia. She is not toxic or septic clinically. She is dehydrated. Eyes: PERRL, normal lids, irises & conjunctiva ENT: grossly normal hearing, lips & tongue Neck: no LAD, masses or thyromegaly Cardiovascular: RRR, no m/r/g. No LE edema. Telemetry: SR, no arrhythmias  Respiratory: CTA bilaterally, no w/r/r. Normal respiratory effort. Abdomen: soft, ntnd. Minimal tenderness in the upper abdomen in a generalized fashion. Bowel sounds are very not heard. : Skin: no rash or induration seen on limited exam Musculoskeletal: grossly normal tone BUE/BLE Psychiatric: grossly normal mood and affect, speech fluent and appropriate Neurologic: grossly non-focal.          Labs on Admission:  Basic Metabolic Panel:  Recent Labs Lab 01/28/15 0720 01/29/15 1920  NA 138 136  K 3.6 3.1*  CL 101 88*  CO2 26 33*  GLUCOSE 175* 205*  BUN 23 46*  CREATININE 0.85 2.78*  CALCIUM 10.6* 10.6*   Liver Function Tests:  Recent Labs Lab 01/28/15 0720 01/29/15 1920  AST 30 22  ALT 32 28  ALKPHOS 74 68  BILITOT 1.1 1.0  PROT 8.8* 10.2*  ALBUMIN 4.7 5.0    Recent Labs Lab 01/28/15 0720  LIPASE 21   No results for input(s): AMMONIA in the last 168 hours. CBC:  Recent Labs Lab 01/28/15  0720 01/29/15 1920  WBC 7.4 3.7*  NEUTROABS 5.7 2.1  HGB 15.9* 17.5*  HCT 46.0 50.0*  MCV 90.9 90.3  PLT 226 297   Cardiac Enzymes: No results for input(s): CKTOTAL, CKMB, CKMBINDEX, TROPONINI in the last 168 hours.  BNP (last 3 results) No results for input(s): BNP in the last 8760 hours.  ProBNP (last 3 results) No results for input(s): PROBNP in the last 8760 hours.  CBG: No results for input(s): GLUCAP in the last 168 hours.  Radiological Exams on Admission: Ct Abdomen Pelvis W Contrast  01/29/2015   CLINICAL DATA:  Vomiting for 2 days. Associated abdominal pain.  Zofran prescription without relief. No other related symptoms.  EXAM: CT ABDOMEN AND PELVIS WITH CONTRAST  TECHNIQUE: Multidetector CT imaging of the abdomen and pelvis was performed using the standard protocol following bolus administration of intravenous contrast.  CONTRAST:  118mL OMNIPAQUE IOHEXOL 300 MG/ML SOLN, 84mL OMNIPAQUE IOHEXOL 300 MG/ML SOLN  COMPARISON:  None.  FINDINGS: Mild atelectasis in the lung bases.  Gas is demonstrated in the gallbladder and bile ducts. There is prominent distention of the stomach and small bowel down to the right lower quadrant. Distal decompression of bowel loops consistent with high-grade obstruction. Calcified gallstone in a right lower quadrant small bowel loop at the transition zone. Appearance is consistent with gallstone ileus.  Mild diffuse fatty infiltration of the liver. 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 The pancreas, spleen, adrenal glands, abdominal aorta, inferior vena cava, and retroperitoneal lymph nodes are unremarkable. No free air or free fluid in the abdomen. Moderate-sized umbilical hernia containing fat. No bowel content.  Pelvis: Uterus and ovaries are not enlarged. Colon is decompressed. Appendix is normal. No free or loculated pelvic fluid collections. No pelvic mass or lymphadenopathy. Calcified phleboliths. Bladder is decompressed. Mild degenerative changes in the spine. No destructive bone lesions.  IMPRESSION: Changes of gallstone ileus with obstructing calcified stone in the right lower quadrant small bowel. Proximal small bowel and gastric distention. Gas in the gallbladder and biliary tree. Incidental note of horseshoe kidney.   Electronically Signed   By: Lucienne Capers M.D.   On: 01/29/2015 21:15      Assessment/Plan  1. Abdominal pain with vomiting. The etiology of this appears to be gallstone ileus with obstructing stone in the small bowel causing small bowel and gastric distention. We will treat her  conservatively at the present time with IV fluids, only sips and empirical antibiotics. Surgery is aware and Dr. Arnoldo Morale will see the patient in the morning.  2. Dehydration/ acute renal failure. Treat with fairly aggressive IV fluids and monitor renal function closely.  Further recommendations will depend on patient's hospital progress.  Code Status: full code.  DVT Prophylaxis: Lovenox   Family Communication:I discussed the plan with the patient and patient's husband at the bedside  Disposition : Home when medically stable.  Time spent: 60 minutes  [facility] Pager [phone]

## 2015-01-30 ENCOUNTER — Encounter (HOSPITAL_COMMUNITY): Payer: Self-pay | Admitting: Internal Medicine

## 2015-01-30 ENCOUNTER — Inpatient Hospital Stay (HOSPITAL_COMMUNITY): Payer: BLUE CROSS/BLUE SHIELD

## 2015-01-30 DIAGNOSIS — N179 Acute kidney failure, unspecified: Secondary | ICD-10-CM

## 2015-01-30 DIAGNOSIS — R109 Unspecified abdominal pain: Secondary | ICD-10-CM

## 2015-01-30 DIAGNOSIS — R739 Hyperglycemia, unspecified: Secondary | ICD-10-CM | POA: Diagnosis present

## 2015-01-30 DIAGNOSIS — K563 Gallstone ileus: Principal | ICD-10-CM

## 2015-01-30 DIAGNOSIS — E876 Hypokalemia: Secondary | ICD-10-CM | POA: Diagnosis present

## 2015-01-30 LAB — CBC
HEMATOCRIT: 48.2 % — AB (ref 36.0–46.0)
HEMOGLOBIN: 16.7 g/dL — AB (ref 12.0–15.0)
MCH: 31.6 pg (ref 26.0–34.0)
MCHC: 34.6 g/dL (ref 30.0–36.0)
MCV: 91.1 fL (ref 78.0–100.0)
Platelets: 282 10*3/uL (ref 150–400)
RBC: 5.29 MIL/uL — AB (ref 3.87–5.11)
RDW: 13 % (ref 11.5–15.5)
WBC: 4.3 10*3/uL (ref 4.0–10.5)

## 2015-01-30 LAB — GLUCOSE, CAPILLARY
GLUCOSE-CAPILLARY: 149 mg/dL — AB (ref 70–99)
Glucose-Capillary: 171 mg/dL — ABNORMAL HIGH (ref 70–99)
Glucose-Capillary: 137 mg/dL — ABNORMAL HIGH (ref 70–99)

## 2015-01-30 LAB — BASIC METABOLIC PANEL
BUN: 55 mg/dL — AB (ref 4–21)
BUN: 46 mg/dL — AB (ref 4–21)
CREATININE: 2.8 mg/dL — AB (ref <=1.1)
Creatinine: 2.8 mg/dL — AB (ref 0.5–1.1)
GLUCOSE: 162 mg/dL
Glucose: 205 mg/dL
POTASSIUM: 3.2 mmol/L — AB (ref 3.4–5.3)
Potassium: 3.1 mmol/L — AB (ref 3.4–5.3)
SODIUM: 136 mmol/L — AB (ref 137–147)
Sodium: 136 mmol/L — AB (ref 137–147)

## 2015-01-30 LAB — COMPREHENSIVE METABOLIC PANEL
ALBUMIN: 4.5 g/dL (ref 3.5–5.2)
ALT: 25 U/L (ref 0–35)
ANION GAP: 15 (ref 5–15)
AST: 17 U/L (ref 0–37)
Alkaline Phosphatase: 62 U/L (ref 39–117)
BUN: 55 mg/dL — ABNORMAL HIGH (ref 6–23)
CHLORIDE: 89 mmol/L — AB (ref 96–112)
CO2: 32 mmol/L (ref 19–32)
Calcium: 9.8 mg/dL (ref 8.4–10.5)
Creatinine, Ser: 2.77 mg/dL — ABNORMAL HIGH (ref 0.50–1.10)
GFR calc Af Amer: 21 mL/min — ABNORMAL LOW (ref >90)
GFR calc non Af Amer: 18 mL/min — ABNORMAL LOW (ref >90)
GLUCOSE: 162 mg/dL — AB (ref 70–99)
POTASSIUM: 3.2 mmol/L — AB (ref 3.5–5.1)
Sodium: 136 mmol/L (ref 135–145)
Total Bilirubin: 1 mg/dL (ref 0.3–1.2)
Total Protein: 9.3 g/dL — ABNORMAL HIGH (ref 6.0–8.3)

## 2015-01-30 LAB — HEPATIC FUNCTION PANEL
ALT: 25 U/L (ref 7–35)
ALT: 28 U/L (ref 7–35)
AST: 17 U/L (ref 13–35)
AST: 22 U/L (ref 13–35)
Alkaline Phosphatase: 62 U/L (ref 25–125)
Alkaline Phosphatase: 68 U/L (ref 25–125)
BILIRUBIN, TOTAL: 1 mg/dL
Bilirubin, Total: 1 mg/dL

## 2015-01-30 LAB — CBC AND DIFFERENTIAL
HCT: 48 % — AB (ref 36–46)
Hemoglobin: 16.7 g/dL — AB (ref 12.0–16.0)
Platelets: 282 10*3/uL (ref 150–399)
WBC: 4.3 10^3/mL
WBC: 3.7 10^3/mL

## 2015-01-30 LAB — SURGICAL PCR SCREEN
MRSA, PCR: NEGATIVE
Staphylococcus aureus: NEGATIVE

## 2015-01-30 MED ORDER — POTASSIUM CHLORIDE 10 MEQ/100ML IV SOLN
10.0000 meq | INTRAVENOUS | Status: AC
  Administered 2015-01-30 (×3): 10 meq via INTRAVENOUS
  Filled 2015-01-30 (×3): qty 100

## 2015-01-30 MED ORDER — SODIUM CHLORIDE 0.9 % IV BOLUS (SEPSIS)
1000.0000 mL | Freq: Once | INTRAVENOUS | Status: AC
  Administered 2015-01-30: 1000 mL via INTRAVENOUS

## 2015-01-30 MED ORDER — INSULIN ASPART 100 UNIT/ML ~~LOC~~ SOLN
0.0000 [IU] | Freq: Three times a day (TID) | SUBCUTANEOUS | Status: DC
  Administered 2015-01-30: 2 [IU] via SUBCUTANEOUS
  Administered 2015-01-30 – 2015-01-31 (×3): 1 [IU] via SUBCUTANEOUS
  Administered 2015-01-31: 2 [IU] via SUBCUTANEOUS
  Administered 2015-02-01 – 2015-02-02 (×3): 1 [IU] via SUBCUTANEOUS
  Administered 2015-02-02 – 2015-02-03 (×2): 2 [IU] via SUBCUTANEOUS
  Administered 2015-02-03: 1 [IU] via SUBCUTANEOUS
  Administered 2015-02-03 – 2015-02-04 (×2): 2 [IU] via SUBCUTANEOUS
  Administered 2015-02-04: 1 [IU] via SUBCUTANEOUS

## 2015-01-30 MED ORDER — ENOXAPARIN SODIUM 40 MG/0.4ML ~~LOC~~ SOLN: 40.0000 mg | SUBCUTANEOUS | Status: DC

## 2015-01-30 MED ORDER — MORPHINE SULFATE 2 MG/ML IJ SOLN
2.0000 mg | INTRAMUSCULAR | Status: DC | PRN
Start: 2015-01-30 — End: 2015-02-01
  Administered 2015-01-30 – 2015-02-01 (×6): 2 mg via INTRAVENOUS
  Filled 2015-01-30 (×6): qty 1

## 2015-01-30 MED ORDER — PROMETHAZINE HCL 25 MG/ML IJ SOLN
25.0000 mg | Freq: Four times a day (QID) | INTRAMUSCULAR | Status: DC | PRN
  Administered 2015-01-30: 25 mg via INTRAVENOUS
  Filled 2015-01-30: qty 1

## 2015-01-30 MED ORDER — HEPARIN SODIUM (PORCINE) 5000 UNIT/ML IJ SOLN
5000.0000 [IU] | Freq: Three times a day (TID) | INTRAMUSCULAR | Status: DC
  Administered 2015-01-30 – 2015-02-01 (×5): 5000 [IU] via SUBCUTANEOUS
  Filled 2015-01-30 (×4): qty 1

## 2015-01-30 MED ORDER — PROMETHAZINE HCL 25 MG/ML IJ SOLN
12.5000 mg | Freq: Four times a day (QID) | INTRAMUSCULAR | Status: DC | PRN
Start: 2015-01-30 — End: 2015-02-05
  Administered 2015-01-30 – 2015-02-04 (×9): 12.5 mg via INTRAVENOUS
  Filled 2015-01-30 (×9): qty 1

## 2015-01-30 MED ORDER — INSULIN ASPART 100 UNIT/ML ~~LOC~~ SOLN: 0.0000 [IU] | Freq: Every day | SUBCUTANEOUS | Status: DC

## 2015-01-30 NOTE — Progress Notes (Signed)
[facility] PROGRESS NOTE  Jevaeh Shams ZDG:[id number] DOB: Feb 23, 1961 DOA: 01/29/2015 PCP: No PCP Per Patient  Assessment/Plan:  Gallstone ileus: with obstructing stone in small bowel verified by CT. Appreciated general surgery assistance. Zosyn started.  Currently NPO and provided with supportive treatment i.e. Analgesics and anti-emetics.   Active Problems: Acute renal failure: related to dehydration secondary to decreased po intake and vomiting. Hold any nephrotoxins.  Vigorous IV fluids. Monitor intake and output. Recheck in am  Hypokalemia: related to above. Will replete and recheck.     Hyperglycemia: no hx of diabetes. Will check A1c and monitor CBG's, provide SSI.     Abdominal pain: related to above. Pain management as above    Obesity:BMI 43.3         Code Status: full Family Communication: non present Disposition Plan: home when ready   Consultants:  Dr Arnoldo Morale general surgery  Procedures:  none  Antibiotics:  Zosyn 01/29/15>>>  HPI/Subjective: Lying in bed  Objective: Filed Vitals:   01/30/15 0619  BP: 130/98  Pulse:   Temp:   Resp:     Intake/Output Summary (Last 24 hours) at 01/30/15 0956 Last data filed at 01/30/15 9518  Gross per 24 hour  Intake 1006.25 ml  Output      0 ml  Net 1006.25 ml   Filed Weights   01/29/15 1709 01/29/15 2250  Weight: 117.935 kg (260 lb) 121.564 kg (268 lb)    Exam:   General:  Obese somewhat lethargic appears comfortable  Cardiovascular: RRR no MGR No LE edema  Respiratory: normal effort somewhat shallow BS clear bilaterally no wheeze  Abdomen: obese non-distended +BS mild diffuse tenderness  Musculoskeletal: no clubbing or cyanosis   Data Reviewed: Basic Metabolic Panel:  Recent Labs Lab 01/28/15 0720 01/29/15 1920 01/30/15 0520  NA 138 136 136  K 3.6 3.1* 3.2*  CL 101 88* 89*  CO2 26 33* 32  GLUCOSE 175* 205* 162*  BUN 23 46* 55*  CREATININE 0.85 2.78* 2.77*  CALCIUM 10.6*  10.6* 9.8   Liver Function Tests:  Recent Labs Lab 01/28/15 0720 01/29/15 1920 01/30/15 0520  AST 30 22 17   ALT 32 28 25  ALKPHOS 74 68 62  BILITOT 1.1 1.0 1.0  PROT 8.8* 10.2* 9.3*  ALBUMIN 4.7 5.0 4.5    Recent Labs Lab 01/28/15 0720  LIPASE 21   No results for input(s): AMMONIA in the last 168 hours. CBC:  Recent Labs Lab 01/28/15 0720 01/29/15 1920 01/30/15 0520  WBC 7.4 3.7* 4.3  NEUTROABS 5.7 2.1  --   HGB 15.9* 17.5* 16.7*  HCT 46.0 50.0* 48.2*  MCV 90.9 90.3 91.1  PLT 226 297 282   Cardiac Enzymes: No results for input(s): CKTOTAL, CKMB, CKMBINDEX, TROPONINI in the last 168 hours. BNP (last 3 results) No results for input(s): BNP in the last 8760 hours.  ProBNP (last 3 results) No results for input(s): PROBNP in the last 8760 hours.  CBG: No results for input(s): GLUCAP in the last 168 hours.  Recent Results (from the past 240 hour(s))  Surgical pcr screen     Status: None   Collection Time: 01/29/15 11:36 PM  Result Value Ref Range Status   MRSA, PCR NEGATIVE NEGATIVE Final   Staphylococcus aureus NEGATIVE NEGATIVE Final    Comment:        The Xpert SA Assay (FDA approved for NASAL specimens in patients over 76 years of age), is one component of a comprehensive surveillance program.  Test  performance has been validated by [facility] for patients greater than or equal to 35 year old. It is not intended to diagnose infection nor to guide or monitor treatment.      Studies: Ct Abdomen Pelvis W Contrast  01/29/2015   CLINICAL DATA:  Vomiting for 2 days. Associated abdominal pain. Zofran prescription without relief. No other related symptoms.  EXAM: CT ABDOMEN AND PELVIS WITH CONTRAST  TECHNIQUE: Multidetector CT imaging of the abdomen and pelvis was performed using the standard protocol following bolus administration of intravenous contrast.  CONTRAST:  126mL OMNIPAQUE IOHEXOL 300 MG/ML SOLN, 78mL OMNIPAQUE IOHEXOL 300 MG/ML SOLN   COMPARISON:  None.  FINDINGS: Mild atelectasis in the lung bases.  Gas is demonstrated in the gallbladder and bile ducts. There is prominent distention of the stomach and small bowel down to the right lower quadrant. Distal decompression of bowel loops consistent with high-grade obstruction. Calcified gallstone in a right lower quadrant small bowel loop at the transition zone. Appearance is consistent with gallstone ileus.  Mild diffuse fatty infiltration of the liver. 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 The pancreas, spleen, adrenal glands, abdominal aorta, inferior vena cava, and retroperitoneal lymph nodes are unremarkable. No free air or free fluid in the abdomen. Moderate-sized umbilical hernia containing fat. No bowel content.  Pelvis: Uterus and ovaries are not enlarged. Colon is decompressed. Appendix is normal. No free or loculated pelvic fluid collections. No pelvic mass or lymphadenopathy. Calcified phleboliths. Bladder is decompressed. Mild degenerative changes in the spine. No destructive bone lesions.  IMPRESSION: Changes of gallstone ileus with obstructing calcified stone in the right lower quadrant small bowel. Proximal small bowel and gastric distention. Gas in the gallbladder and biliary tree. Incidental note of horseshoe kidney.   Electronically Signed   By: Lucienne Capers M.D.   On: 01/29/2015 21:15    Scheduled Meds: . antiseptic oral rinse  7 mL Mouth Rinse BID  . enoxaparin (LOVENOX) injection  40 mg Subcutaneous Q24H  . piperacillin-tazobactam (ZOSYN)  IV  3.375 g Intravenous Q8H   Continuous Infusions: . sodium chloride 125 mL/hr at 01/30/15 0610    Principal Problem:    Time spent: 35 minutes    [facility] Pager [phone]. If 7PM-7AM, please contact night-coverage at [email or web address], password [facility] 01/30/2015, 9:56 AM  LOS: 1 day

## 2015-01-30 NOTE — Consult Note (Signed)
Reason for Consult: Small bowel obstruction Referring Physician: Hospitalist  Miranda Aguilar is an 54 y.o. female.  HPI: Patient is a 54 year old white female who presented emergency room twice with worsening nausea, vomiting, and abdominal distention. She had a CT scan of the abdomen yesterday which reveals a gallstone ileus. She denies any upper abdominal complaints. She was also noted to have renal insufficiency. She was admitted to the hospital for further evaluation treatment.  Past Medical History  Diagnosis Date  . Gallstone ileus   . Acute renal failure     History reviewed. No pertinent past surgical history.  Family History  Problem Relation Age of Onset  . Cancer Mother   . Diabetes Mother   . Cancer Father   . Stroke Father     Social History:  reports that she has never smoked. She has never used smokeless tobacco. She reports that she does not drink alcohol or use illicit drugs.  Allergies: No Known Allergies  Medications: I have reviewed the patient's current medications.  Results for orders placed or performed during the hospital encounter of 01/29/15 (from the past 48 hour(s))  Urinalysis, Routine w reflex microscopic     Status: Abnormal   Collection Time: 01/29/15  6:20 PM  Result Value Ref Range   Color, Urine AMBER (A) YELLOW    Comment: BIOCHEMICALS MAY BE AFFECTED BY COLOR   APPearance HAZY (A) CLEAR   Specific Gravity, Urine >1.030 (H) 1.005 - 1.030   pH 5.5 5.0 - 8.0   Glucose, UA NEGATIVE NEGATIVE mg/dL   Hgb urine dipstick SMALL (A) NEGATIVE   Bilirubin Urine MODERATE (A) NEGATIVE   Ketones, ur NEGATIVE NEGATIVE mg/dL   Protein, ur >300 (A) NEGATIVE mg/dL   Urobilinogen, UA 0.2 0.0 - 1.0 mg/dL   Nitrite NEGATIVE NEGATIVE   Leukocytes, UA NEGATIVE NEGATIVE  Urine microscopic-add on     Status: Abnormal   Collection Time: 01/29/15  6:20 PM  Result Value Ref Range   Squamous Epithelial / LPF FEW (A) RARE   WBC, UA 0-2 <3 WBC/hpf   RBC / HPF  3-6 <3 RBC/hpf   Bacteria, UA MANY (A) RARE   Casts HYALINE CASTS (A) NEGATIVE  CBC with Differential     Status: Abnormal   Collection Time: 01/29/15  7:20 PM  Result Value Ref Range   WBC 3.7 (L) 4.0 - 10.5 K/uL   RBC 5.54 (H) 3.87 - 5.11 MIL/uL   Hemoglobin 17.5 (H) 12.0 - 15.0 g/dL   HCT 50.0 (H) 36.0 - 46.0 %   MCV 90.3 78.0 - 100.0 fL   MCH 31.6 26.0 - 34.0 pg   MCHC 35.0 30.0 - 36.0 g/dL   RDW 12.8 11.5 - 15.5 %   Platelets 297 150 - 400 K/uL    Comment: DELTA CHECK NOTED   Neutrophils Relative % 58 43 - 77 %   Neutro Abs 2.1 1.7 - 7.7 K/uL   Lymphocytes Relative 25 12 - 46 %   Lymphs Abs 0.9 0.7 - 4.0 K/uL   Monocytes Relative 17 (H) 3 - 12 %   Monocytes Absolute 0.6 0.1 - 1.0 K/uL   Eosinophils Relative 0 0 - 5 %   Eosinophils Absolute 0.0 0.0 - 0.7 K/uL   Basophils Relative 0 0 - 1 %   Basophils Absolute 0.0 0.0 - 0.1 K/uL  Comprehensive metabolic panel     Status: Abnormal   Collection Time: 01/29/15  7:20 PM  Result Value Ref Range  Sodium 136 135 - 145 mmol/L   Potassium 3.1 (L) 3.5 - 5.1 mmol/L   Chloride 88 (L) 96 - 112 mmol/L    Comment: DELTA CHECK NOTED   CO2 33 (H) 19 - 32 mmol/L   Glucose, Bld 205 (H) 70 - 99 mg/dL   BUN 46 (H) 6 - 23 mg/dL    Comment: DELTA CHECK NOTED   Creatinine, Ser 2.78 (H) 0.50 - 1.10 mg/dL    Comment: DELTA CHECK NOTED   Calcium 10.6 (H) 8.4 - 10.5 mg/dL   Total Protein 10.2 (H) 6.0 - 8.3 g/dL   Albumin 5.0 3.5 - 5.2 g/dL   AST 22 0 - 37 U/L   ALT 28 0 - 35 U/L   Alkaline Phosphatase 68 39 - 117 U/L   Total Bilirubin 1.0 0.3 - 1.2 mg/dL   GFR calc non Af Amer 18 (L) >90 mL/min   GFR calc Af Amer 21 (L) >90 mL/min    Comment: (NOTE) The eGFR has been calculated using the CKD EPI equation. This calculation has not been validated in all clinical situations. eGFR's persistently <90 mL/min signify possible Chronic Kidney Disease.    Anion gap 15 5 - 15  Surgical pcr screen     Status: None   Collection Time: 01/29/15  11:36 PM  Result Value Ref Range   MRSA, PCR NEGATIVE NEGATIVE   Staphylococcus aureus NEGATIVE NEGATIVE    Comment:        The Xpert SA Assay (FDA approved for NASAL specimens in patients over 37 years of age), is one component of a comprehensive surveillance program.  Test performance has been validated by [facility] for patients greater than or equal to 43 year old. It is not intended to diagnose infection nor to guide or monitor treatment.   Comprehensive metabolic panel     Status: Abnormal   Collection Time: 01/30/15  5:20 AM  Result Value Ref Range   Sodium 136 135 - 145 mmol/L   Potassium 3.2 (L) 3.5 - 5.1 mmol/L   Chloride 89 (L) 96 - 112 mmol/L   CO2 32 19 - 32 mmol/L   Glucose, Bld 162 (H) 70 - 99 mg/dL   BUN 55 (H) 6 - 23 mg/dL   Creatinine, Ser 2.77 (H) 0.50 - 1.10 mg/dL   Calcium 9.8 8.4 - 10.5 mg/dL   Total Protein 9.3 (H) 6.0 - 8.3 g/dL   Albumin 4.5 3.5 - 5.2 g/dL   AST 17 0 - 37 U/L   ALT 25 0 - 35 U/L   Alkaline Phosphatase 62 39 - 117 U/L   Total Bilirubin 1.0 0.3 - 1.2 mg/dL   GFR calc non Af Amer 18 (L) >90 mL/min   GFR calc Af Amer 21 (L) >90 mL/min    Comment: (NOTE) The eGFR has been calculated using the CKD EPI equation. This calculation has not been validated in all clinical situations. eGFR's persistently <90 mL/min signify possible Chronic Kidney Disease.    Anion gap 15 5 - 15  CBC     Status: Abnormal   Collection Time: 01/30/15  5:20 AM  Result Value Ref Range   WBC 4.3 4.0 - 10.5 K/uL   RBC 5.29 (H) 3.87 - 5.11 MIL/uL   Hemoglobin 16.7 (H) 12.0 - 15.0 g/dL   HCT 48.2 (H) 36.0 - 46.0 %   MCV 91.1 78.0 - 100.0 fL   MCH 31.6 26.0 - 34.0 pg   MCHC 34.6 30.0 - 36.0  g/dL   RDW 13.0 11.5 - 15.5 %   Platelets 282 150 - 400 K/uL    Ct Abdomen Pelvis W Contrast  01/29/2015   CLINICAL DATA:  Vomiting for 2 days. Associated abdominal pain. Zofran prescription without relief. No other related symptoms.  EXAM: CT ABDOMEN AND PELVIS WITH  CONTRAST  TECHNIQUE: Multidetector CT imaging of the abdomen and pelvis was performed using the standard protocol following bolus administration of intravenous contrast.  CONTRAST:  183m OMNIPAQUE IOHEXOL 300 MG/ML SOLN, 268mOMNIPAQUE IOHEXOL 300 MG/ML SOLN  COMPARISON:  None.  FINDINGS: Mild atelectasis in the lung bases.  Gas is demonstrated in the gallbladder and bile ducts. There is prominent distention of the stomach and small bowel down to the right lower quadrant. Distal decompression of bowel loops consistent with high-grade obstruction. Calcified gallstone in a right lower quadrant small bowel loop at the transition zone. Appearance is consistent with gallstone ileus.  Mild diffuse fatty infiltration of the liver. 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 The pancreas, spleen, adrenal glands, abdominal aorta, inferior vena cava, and retroperitoneal lymph nodes are unremarkable. No free air or free fluid in the abdomen. Moderate-sized umbilical hernia containing fat. No bowel content.  Pelvis: Uterus and ovaries are not enlarged. Colon is decompressed. Appendix is normal. No free or loculated pelvic fluid collections. No pelvic mass or lymphadenopathy. Calcified phleboliths. Bladder is decompressed. Mild degenerative changes in the spine. No destructive bone lesions.  IMPRESSION: Changes of gallstone ileus with obstructing calcified stone in the right lower quadrant small bowel. Proximal small bowel and gastric distention. Gas in the gallbladder and biliary tree. Incidental note of horseshoe kidney.   Electronically Signed   By: WiLucienne Capers.D.   On: 01/29/2015 21:15    ROS: See chart Blood pressure 130/98, pulse 101, temperature 98.8 F (37.1 C), temperature source Oral, resp. rate 20, height 5' 6"  (1.676 m), weight 121.564 kg (268 lb), last menstrual period 11/22/2014, SpO2 93 %. Physical Exam: Pleasant white female in no acute distress. Abdomen is soft with mild  distention noted, but this is difficult to assess secondary to body habitus. No rigidity is noted. No hernias are noted.  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 Acute renal failure secondary to dehydration and third spacing  Plan: Need to correct third space fluid loss and renal insufficiency. Will eventually need exploratory laparotomy for extraction of the gallstone. After temporarily schedule this for to 02/01/2015, pending resolution of her renal failure.  Maikol Grassia A 01/30/2015, 10:51 AM

## 2015-01-30 NOTE — Care Management Utilization Note (Signed)
UR completed 

## 2015-01-30 NOTE — Progress Notes (Signed)
Inpatient Diabetes Program Recommendations  AACE/ADA: New Consensus Statement on Inpatient Glycemic Control (2013)  Target Ranges:  Prepandial:   less than 140 mg/dL      Peak postprandial:   less than 180 mg/dL (1-2 hours)      Critically ill patients:  140 - 180 mg/dL   Results for SHERROL, VICARS (MRN [id number]) as of 01/30/2015 08:00  Ref. Range 01/28/2015 07:20 01/29/2015 19:20 01/30/2015 05:20  Glucose Latest Range: 70-99 mg/dL 175 (H) 205 (H) 162 (H)    Diabetes history: No Outpatient Diabetes medications: NA Current orders for Inpatient glycemic control: None  Inpatient Diabetes Program Recommendations Correction (SSI): Noted fasting lab glucose of 162 mg/dl this morning. While inpatient please consider ordering CBGs with Novolog correction scale. HgbA1C: Patient has no documented history of diabetes. Please consider ordering an A1C to evaluate glycemic control over the past 2-3 months.  Thanks, Barnie Alderman, RN, MSN, CCRN, CDE Diabetes Coordinator Inpatient Diabetes Program [phone] (Team Pager) [phone] (AP office) [phone] [facility] office)

## 2015-01-30 NOTE — Care Management Note (Addendum)
    Page 1 of 1   02/03/2015     11:31:51 AM CARE MANAGEMENT NOTE 02/03/2015  Patient:  Miranda Aguilar, Miranda Aguilar   Account Number:  [id number]  Date Initiated:  01/30/2015  Documentation initiated by:  Jolene Provost  Subjective/Objective Assessment:   Pt admitted with abd pain/N/V. Pt is from home, lives with husband. Pt is independent with ADL's. Pt has no HH services, DME's or med needs prior to admission. Pt has no PCP but has been to [facility] in the past.     Action/Plan:   Pt plans to discharge home with self care. Surgery scheduled for Wednesday. Will f/u with Dr. Arnoldo Morale at discharge. Will need appointment with PCP at discharge. Will attempt to make appointment at [facility] Will cont to follow.   Anticipated DC Date:  02/04/2015   Anticipated DC Plan:  [place]  CM consult      Choice offered to / List presented to:             Status of service:  Completed, signed off Medicare Important Message given?   (If response is "NO", the following Medicare IM given date fields will be blank) Date Medicare IM given:   Medicare IM given by:   Date Additional Medicare IM given:   Additional Medicare IM given by:    Discharge Disposition:  HOME/SELF CARE  Per UR Regulation:  Reviewed for med. necessity/level of care/duration of stay  If discussed at [place] of Stay Meetings, dates discussed:    Comments:  02/03/2015 [place], RN, MSN, CM 01/30/2015 [place], RN, MSN, CM

## 2015-01-31 DIAGNOSIS — E876 Hypokalemia: Secondary | ICD-10-CM

## 2015-01-31 LAB — BASIC METABOLIC PANEL
ANION GAP: 15 (ref 5–15)
Anion gap: 3 — ABNORMAL LOW (ref 5–15)
BUN: 52 mg/dL — ABNORMAL HIGH (ref 6–23)
BUN: 37 mg/dL — AB (ref 6–23)
CALCIUM: 8.8 mg/dL (ref 8.4–10.5)
CHLORIDE: 100 mmol/L (ref 96–112)
CO2: 31 mmol/L (ref 19–32)
CO2: 31 mmol/L (ref 19–32)
CREATININE: 1.03 mg/dL (ref 0.50–1.10)
Calcium: 8.2 mg/dL — ABNORMAL LOW (ref 8.4–10.5)
Chloride: 92 mmol/L — ABNORMAL LOW (ref 96–112)
Creatinine, Ser: 1.41 mg/dL — ABNORMAL HIGH (ref 0.50–1.10)
GFR calc Af Amer: 48 mL/min — ABNORMAL LOW (ref >90)
GFR calc Af Amer: 71 mL/min — ABNORMAL LOW (ref >90)
GFR calc non Af Amer: 61 mL/min — ABNORMAL LOW (ref >90)
GFR, EST NON AFRICAN AMERICAN: 42 mL/min — AB (ref >90)
GLUCOSE: 151 mg/dL — AB (ref 70–99)
Glucose, Bld: 162 mg/dL — ABNORMAL HIGH (ref 70–99)
POTASSIUM: 3.5 mmol/L (ref 3.5–5.1)
Potassium: 3.1 mmol/L — ABNORMAL LOW (ref 3.5–5.1)
SODIUM: 138 mmol/L (ref 135–145)
Sodium: 134 mmol/L — ABNORMAL LOW (ref 135–145)

## 2015-01-31 LAB — URINE CULTURE
Colony Count: 2000
Special Requests: NORMAL

## 2015-01-31 LAB — CBC
HCT: 46.6 % — ABNORMAL HIGH (ref 36.0–46.0)
HEMOGLOBIN: 15.7 g/dL — AB (ref 12.0–15.0)
MCH: 30.8 pg (ref 26.0–34.0)
MCHC: 33.7 g/dL (ref 30.0–36.0)
MCV: 91.6 fL (ref 78.0–100.0)
Platelets: 309 10*3/uL (ref 150–400)
RBC: 5.09 MIL/uL (ref 3.87–5.11)
RDW: 12.9 % (ref 11.5–15.5)
WBC: 7.6 10*3/uL (ref 4.0–10.5)

## 2015-01-31 LAB — GLUCOSE, CAPILLARY
GLUCOSE-CAPILLARY: 128 mg/dL — AB (ref 70–99)
GLUCOSE-CAPILLARY: 148 mg/dL — AB (ref 70–99)
GLUCOSE-CAPILLARY: 117 mg/dL — AB (ref 70–99)
Glucose-Capillary: 162 mg/dL — ABNORMAL HIGH (ref 70–99)

## 2015-01-31 LAB — HEMOGLOBIN A1C
Hgb A1c MFr Bld: 6 % — ABNORMAL HIGH (ref 4.8–5.6)
MEAN PLASMA GLUCOSE: 126 mg/dL

## 2015-01-31 LAB — MAGNESIUM: Magnesium: 2.5 mg/dL (ref 1.5–2.5)

## 2015-01-31 MED ORDER — METOPROLOL TARTRATE 1 MG/ML IV SOLN
5.0000 mg | Freq: Four times a day (QID) | INTRAVENOUS | Status: DC
  Administered 2015-01-31 – 2015-02-04 (×14): 5 mg via INTRAVENOUS
  Filled 2015-01-31 (×15): qty 5

## 2015-01-31 MED ORDER — POTASSIUM CHLORIDE 10 MEQ/100ML IV SOLN
10.0000 meq | INTRAVENOUS | Status: AC
  Administered 2015-01-31 (×4): 10 meq via INTRAVENOUS
  Filled 2015-01-31 (×4): qty 100

## 2015-01-31 MED ORDER — POTASSIUM CHLORIDE IN NACL 40-0.9 MEQ/L-% IV SOLN
INTRAVENOUS | Status: DC
  Administered 2015-01-31 (×2): 125 mL/h via INTRAVENOUS

## 2015-01-31 MED ORDER — SODIUM CHLORIDE 0.9 % IV BOLUS (SEPSIS)
500.0000 mL | Freq: Once | INTRAVENOUS | Status: AC
  Administered 2015-01-31: 500 mL via INTRAVENOUS

## 2015-01-31 MED ORDER — METOPROLOL TARTRATE 1 MG/ML IV SOLN: 5.0000 mg | Freq: Four times a day (QID) | INTRAVENOUS | Status: DC

## 2015-01-31 MED ORDER — POTASSIUM CHLORIDE 10 MEQ/100ML IV SOLN: 10.0000 meq | INTRAVENOUS | Status: DC

## 2015-01-31 MED ORDER — CHLORHEXIDINE GLUCONATE 4 % EX LIQD
1.0000 "application " | Freq: Once | CUTANEOUS | Status: AC
  Administered 2015-01-31: 1 via TOPICAL
  Filled 2015-01-31: qty 15

## 2015-01-31 NOTE — Progress Notes (Signed)
Subjective: Still with nausea and emesis. No significant abdominal pain noted.  Objective: Vital signs in last 24 hours: Temp:  [97.4 F (36.3 C)-98.1 F (36.7 C)] 97.4 F (36.3 C) (02/23 0546) Pulse Rate:  [89-111] 111 (02/23 0546) Resp:  [20-22] 22 (02/23 0546) BP: (131-148)/(87-113) 148/113 mmHg (02/23 0546) SpO2:  [92 %-93 %] 92 % (02/23 0546) Last BM Date: 01/27/15  Intake/Output from previous day: 02/22 0701 - 02/23 0700 In: -  Out: 150 [Emesis/NG output:150] Intake/Output this shift:    General appearance: alert, cooperative and no distress GI: Soft, nontender. No rigidity noted. Bowel sounds present.  Lab Results:   Recent Labs  01/30/15 0520 01/31/15 0558  WBC 4.3 7.6  HGB 16.7* 15.7*  HCT 48.2* 46.6*  PLT 282 309   BMET  Recent Labs  01/30/15 0520 01/31/15 0558  NA 136 138  K 3.2* 3.1*  CL 89* 92*  CO2 32 31  GLUCOSE 162* 162*  BUN 55* 52*  CREATININE 2.77* 1.41*  CALCIUM 9.8 8.8   PT/INR No results for input(s): LABPROT, INR in the last 72 hours.  Studies/Results: Dg Chest 2 View  01/30/2015   CLINICAL DATA:  54 year old with hypertension and gallstone ileus.  EXAM: CHEST  2 VIEW  COMPARISON:  Abdominal CT 01/29/2015  FINDINGS: Two views of the chest demonstrate a few linear densities at the left lung base. These linear densities are suggestive for mild atelectasis. Otherwise, the lung bases are clear. Air-fluid level in the stomach. Heart size is normal. The trachea is midline. Negative for pleural effusions.  IMPRESSION: Mild left basilar atelectasis.   Electronically Signed   By: Markus Daft M.D.   On: 01/30/2015 15:33   Ct Abdomen Pelvis W Contrast  01/29/2015   CLINICAL DATA:  Vomiting for 2 days. Associated abdominal pain. Zofran prescription without relief. No other related symptoms.  EXAM: CT ABDOMEN AND PELVIS WITH CONTRAST  TECHNIQUE: Multidetector CT imaging of the abdomen and pelvis was performed using the standard protocol  following bolus administration of intravenous contrast.  CONTRAST:  145mL OMNIPAQUE IOHEXOL 300 MG/ML SOLN, 64mL OMNIPAQUE IOHEXOL 300 MG/ML SOLN  COMPARISON:  None.  FINDINGS: Mild atelectasis in the lung bases.  Gas is demonstrated in the gallbladder and bile ducts. There is prominent distention of the stomach and small bowel down to the right lower quadrant. Distal decompression of bowel loops consistent with high-grade obstruction. Calcified gallstone in a right lower quadrant small bowel loop at the transition zone. Appearance is consistent with gallstone ileus.  Mild diffuse fatty infiltration of the liver. 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 The pancreas, spleen, adrenal glands, abdominal aorta, inferior vena cava, and retroperitoneal lymph nodes are unremarkable. No free air or free fluid in the abdomen. Moderate-sized umbilical hernia containing fat. No bowel content.  Pelvis: Uterus and ovaries are not enlarged. Colon is decompressed. Appendix is normal. No free or loculated pelvic fluid collections. No pelvic mass or lymphadenopathy. Calcified phleboliths. Bladder is decompressed. Mild degenerative changes in the spine. No destructive bone lesions.  IMPRESSION: Changes of gallstone ileus with obstructing calcified stone in the right lower quadrant small bowel. Proximal small bowel and gastric distention. Gas in the gallbladder and biliary tree. Incidental note of horseshoe kidney.   Electronically Signed   By: Lucienne Capers M.D.   On: 01/29/2015 21:15    Anti-infectives: Anti-infectives    Start     Dose/Rate Route Frequency Ordered Stop   01/30/15 0600  piperacillin-tazobactam (ZOSYN) IVPB 3.375  g     3.375 g 12.5 mL/hr over 240 Minutes Intravenous Every 8 hours 01/29/15 2207     01/29/15 2145  piperacillin-tazobactam (ZOSYN) IVPB 3.375 g     3.375 g 100 mL/hr over 30 Minutes Intravenous  Once 01/29/15 2137 01/29/15 2226      Assessment/Plan: Impression:  Gallstone ileus, renal insufficiency, hypokalemia, hypertension Plan: We will continue fluid boluses and supplement potassium. Will need blood pressure under adequate control. Will proceed with exploratory laparotomy, enterotomy to remove gallstone. The risks and benefits of the procedure including bleeding, infection, and repair breakdown were fully explained to the patient, who gave informed consent.     LOS: 2 days    Miranda Aguilar A 01/31/2015

## 2015-01-31 NOTE — Progress Notes (Signed)
[facility] PROGRESS NOTE  Miranda Aguilar QQP:[id number] DOB: 07-30-1961 DOA: 01/29/2015 PCP: No PCP Per Patient  Assessment/Plan: Gallstone ileus: with obstructing stone in small bowel verified by CT. Zosyn day #3. Currently clear liquids and continues with nausea and intermittent vomiting. Continue phenergan and morphine. Surgery scheduled tomorrow.   Active Problems: Acute renal failure: related to dehydration secondary to decreased po intake and vomiting. Improving with IV fluids.  Monitor intake and output. follow  Hypokalemia: related to above. Will replete and recheck. Check mag level as well  HTN: remote hx of same. Reports being on anti-hypertensive medication in past. Will start metoprolol with parameters.    Hyperglycemia: no hx of diabetes. A1c 6.0. CBG range 137-162. Continue SSI.    Abdominal pain: related to above. Pain management as above. Improved this am.   Obesity:BMI 43.3 nutritional consult after surgery  Code Status: full Family Communication: none present Disposition Plan: home when ready   Consultants:  Dr Arnoldo Morale general surgery  Procedures:  none  Antibiotics:  Zosyn 01/29/15>>  HPI/Subjective: Awake reports continued nausea with emesis.   Objective: Filed Vitals:   01/31/15 0546  BP: 148/113  Pulse: 111  Temp: 97.4 F (36.3 C)  Resp: 22    Intake/Output Summary (Last 24 hours) at 01/31/15 0959 Last data filed at 01/30/15 1847  Gross per 24 hour  Intake      0 ml  Output    150 ml  Net   -150 ml   Filed Weights   01/29/15 1709 01/29/15 2250  Weight: 117.935 kg (260 lb) 121.564 kg (268 lb)    Exam:   General:  Obese appears comfortable   Cardiovascular: tachycardic but regular. No MGR no LE edema  Respiratory: normal effort BS clear bilaterally no wheeze  Abdomen: obese soft sluggish BS non-tender to palpation  Musculoskeletal: no clubbing or cyanosis   Data Reviewed: Basic Metabolic Panel:  Recent  Labs Lab 01/28/15 0720 01/29/15 1920 01/30/15 0520 01/31/15 0558  NA 138 136 136 138  K 3.6 3.1* 3.2* 3.1*  CL 101 88* 89* 92*  CO2 26 33* 32 31  GLUCOSE 175* 205* 162* 162*  BUN 23 46* 55* 52*  CREATININE 0.85 2.78* 2.77* 1.41*  CALCIUM 10.6* 10.6* 9.8 8.8   Liver Function Tests:  Recent Labs Lab 01/28/15 0720 01/29/15 1920 01/30/15 0520  AST 30 22 17   ALT 32 28 25  ALKPHOS 74 68 62  BILITOT 1.1 1.0 1.0  PROT 8.8* 10.2* 9.3*  ALBUMIN 4.7 5.0 4.5    Recent Labs Lab 01/28/15 0720  LIPASE 21   No results for input(s): AMMONIA in the last 168 hours. CBC:  Recent Labs Lab 01/28/15 0720 01/29/15 1920 01/30/15 0520 01/31/15 0558  WBC 7.4 3.7* 4.3 7.6  NEUTROABS 5.7 2.1  --   --   HGB 15.9* 17.5* 16.7* 15.7*  HCT 46.0 50.0* 48.2* 46.6*  MCV 90.9 90.3 91.1 91.6  PLT 226 297 282 309   Cardiac Enzymes: No results for input(s): CKTOTAL, CKMB, CKMBINDEX, TROPONINI in the last 168 hours. BNP (last 3 results) No results for input(s): BNP in the last 8760 hours.  ProBNP (last 3 results) No results for input(s): PROBNP in the last 8760 hours.  CBG:  Recent Labs Lab 01/30/15 1220 01/30/15 1806 01/30/15 2124 01/31/15 0737  GLUCAP 149* 171* 137* 162*    Recent Results (from the past 240 hour(s))  Surgical pcr screen     Status: None   Collection Time: 01/29/15 11:36  PM  Result Value Ref Range Status   MRSA, PCR NEGATIVE NEGATIVE Final   Staphylococcus aureus NEGATIVE NEGATIVE Final    Comment:        The Xpert SA Assay (FDA approved for NASAL specimens in patients over 11 years of age), is one component of a comprehensive surveillance program.  Test performance has been validated by [facility] for patients greater than or equal to 78 year old. It is not intended to diagnose infection nor to guide or monitor treatment.      Studies: Dg Chest 2 View  01/30/2015   CLINICAL DATA:  54 year old with hypertension and gallstone ileus.  EXAM: CHEST  2  VIEW  COMPARISON:  Abdominal CT 01/29/2015  FINDINGS: Two views of the chest demonstrate a few linear densities at the left lung base. These linear densities are suggestive for mild atelectasis. Otherwise, the lung bases are clear. Air-fluid level in the stomach. Heart size is normal. The trachea is midline. Negative for pleural effusions.  IMPRESSION: Mild left basilar atelectasis.   Electronically Signed   By: Markus Daft M.D.   On: 01/30/2015 15:33   Ct Abdomen Pelvis W Contrast  01/29/2015   CLINICAL DATA:  Vomiting for 2 days. Associated abdominal pain. Zofran prescription without relief. No other related symptoms.  EXAM: CT ABDOMEN AND PELVIS WITH CONTRAST  TECHNIQUE: Multidetector CT imaging of the abdomen and pelvis was performed using the standard protocol following bolus administration of intravenous contrast.  CONTRAST:  171mL OMNIPAQUE IOHEXOL 300 MG/ML SOLN, 47mL OMNIPAQUE IOHEXOL 300 MG/ML SOLN  COMPARISON:  None.  FINDINGS: Mild atelectasis in the lung bases.  Gas is demonstrated in the gallbladder and bile ducts. There is prominent distention of the stomach and small bowel down to the right lower quadrant. Distal decompression of bowel loops consistent with high-grade obstruction. Calcified gallstone in a right lower quadrant small bowel loop at the transition zone. Appearance is consistent with gallstone ileus.  Mild diffuse fatty infiltration of the liver. 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 The pancreas, spleen, adrenal glands, abdominal aorta, inferior vena cava, and retroperitoneal lymph nodes are unremarkable. No free air or free fluid in the abdomen. Moderate-sized umbilical hernia containing fat. No bowel content.  Pelvis: Uterus and ovaries are not enlarged. Colon is decompressed. Appendix is normal. No free or loculated pelvic fluid collections. No pelvic mass or lymphadenopathy. Calcified phleboliths. Bladder is decompressed. Mild degenerative changes in the  spine. No destructive bone lesions.  IMPRESSION: Changes of gallstone ileus with obstructing calcified stone in the right lower quadrant small bowel. Proximal small bowel and gastric distention. Gas in the gallbladder and biliary tree. Incidental note of horseshoe kidney.   Electronically Signed   By: Lucienne Capers M.D.   On: 01/29/2015 21:15    Scheduled Meds: . antiseptic oral rinse  7 mL Mouth Rinse BID  . chlorhexidine  1 application Topical Once  . heparin subcutaneous  5,000 Units Subcutaneous 3 times per day  . insulin aspart  0-5 Units Subcutaneous QHS  . insulin aspart  0-9 Units Subcutaneous TID WC  . metoprolol  5 mg Intravenous 4 times per day  . piperacillin-tazobactam (ZOSYN)  IV  3.375 g Intravenous Q8H  . potassium chloride  10 mEq Intravenous Q1 Hr x 4  . sodium chloride  500 mL Intravenous Once   Continuous Infusions: . 0.9 % NaCl with KCl 40 mEq / L      Principal Problem:   Gallstone ileus Active Problems:  Abdominal pain   Obesity   Hypokalemia   Hyperglycemia   Acute renal failure   Abdominal pain in female    Time spent: 35 minutes    [facility] Pager [phone]. If 7PM-7AM, please contact night-coverage at [email or web address], password [facility] 01/31/2015, 9:59 AM  LOS: 2 days

## 2015-02-01 ENCOUNTER — Inpatient Hospital Stay (HOSPITAL_COMMUNITY): Payer: BLUE CROSS/BLUE SHIELD | Admitting: Anesthesiology

## 2015-02-01 ENCOUNTER — Encounter (HOSPITAL_COMMUNITY)
Admission: EM | Disposition: A | Discharge status: Home / Self Care | Payer: Self-pay | Source: Home / Self Care | Attending: General Surgery

## 2015-02-01 ENCOUNTER — Inpatient Hospital Stay (HOSPITAL_COMMUNITY): Payer: BLUE CROSS/BLUE SHIELD

## 2015-02-01 ENCOUNTER — Encounter (HOSPITAL_COMMUNITY): Payer: Self-pay | Admitting: *Deleted

## 2015-02-01 DIAGNOSIS — I10 Essential (primary) hypertension: Secondary | ICD-10-CM

## 2015-02-01 HISTORY — PX: LAPAROTOMY: SHX154

## 2015-02-01 LAB — BASIC METABOLIC PANEL
Anion gap: 6 (ref 5–15)
BUN: 31 mg/dL — ABNORMAL HIGH (ref 6–23)
CALCIUM: 8.7 mg/dL (ref 8.4–10.5)
CO2: 32 mmol/L (ref 19–32)
Chloride: 100 mmol/L (ref 96–112)
Creatinine, Ser: 0.89 mg/dL (ref 0.50–1.10)
GFR calc Af Amer: 84 mL/min — ABNORMAL LOW (ref >90)
GFR, EST NON AFRICAN AMERICAN: 73 mL/min — AB (ref >90)
Glucose, Bld: 145 mg/dL — ABNORMAL HIGH (ref 70–99)
Potassium: 3.5 mmol/L (ref 3.5–5.1)
Sodium: 138 mmol/L (ref 135–145)

## 2015-02-01 LAB — GLUCOSE, CAPILLARY
Glucose-Capillary: 138 mg/dL — ABNORMAL HIGH (ref 70–99)
Glucose-Capillary: 124 mg/dL — ABNORMAL HIGH (ref 70–99)
Glucose-Capillary: 117 mg/dL — ABNORMAL HIGH (ref 70–99)

## 2015-02-01 LAB — CBC
HEMATOCRIT: 43.8 % (ref 36.0–46.0)
Hemoglobin: 14.6 g/dL (ref 12.0–15.0)
MCH: 30.5 pg (ref 26.0–34.0)
MCHC: 33.3 g/dL (ref 30.0–36.0)
MCV: 91.4 fL (ref 78.0–100.0)
Platelets: 277 10*3/uL (ref 150–400)
RBC: 4.79 MIL/uL (ref 3.87–5.11)
RDW: 12.6 % (ref 11.5–15.5)
WBC: 5.9 10*3/uL (ref 4.0–10.5)

## 2015-02-01 SURGERY — LAPAROTOMY, EXPLORATORY
Anesthesia: General | Site: Abdomen

## 2015-02-01 MED ORDER — KETOROLAC TROMETHAMINE 30 MG/ML IJ SOLN
30.0000 mg | Freq: Once | INTRAMUSCULAR | Status: AC
  Administered 2015-02-01: 30 mg via INTRAVENOUS

## 2015-02-01 MED ORDER — EPHEDRINE SULFATE 50 MG/ML IJ SOLN
INTRAMUSCULAR | Status: AC
  Filled 2015-02-01: qty 1

## 2015-02-01 MED ORDER — GLYCOPYRROLATE 0.2 MG/ML IJ SOLN
INTRAMUSCULAR | Status: DC | PRN
  Administered 2015-02-01: 0.6 mg via INTRAVENOUS

## 2015-02-01 MED ORDER — ENOXAPARIN SODIUM 40 MG/0.4ML ~~LOC~~ SOLN
40.0000 mg | SUBCUTANEOUS | Status: DC
  Administered 2015-02-02 – 2015-02-05 (×4): 40 mg via SUBCUTANEOUS
  Filled 2015-02-01 (×4): qty 0.4

## 2015-02-01 MED ORDER — NEOSTIGMINE METHYLSULFATE 10 MG/10ML IV SOLN
INTRAVENOUS | Status: DC | PRN
  Administered 2015-02-01: 4 mg via INTRAVENOUS

## 2015-02-01 MED ORDER — DEXAMETHASONE SODIUM PHOSPHATE 4 MG/ML IJ SOLN
INTRAMUSCULAR | Status: AC
  Filled 2015-02-01: qty 1

## 2015-02-01 MED ORDER — GLYCOPYRROLATE 0.2 MG/ML IJ SOLN
0.2000 mg | Freq: Once | INTRAMUSCULAR | Status: AC
Start: 2015-02-01 — End: 2015-02-01
  Administered 2015-02-01: 0.2 mg via INTRAVENOUS

## 2015-02-01 MED ORDER — MIDAZOLAM HCL 2 MG/2ML IJ SOLN
INTRAMUSCULAR | Status: AC
  Filled 2015-02-01: qty 2

## 2015-02-01 MED ORDER — LACTATED RINGERS IV SOLN
INTRAVENOUS | Status: DC
  Administered 2015-02-01: 1000 mL via INTRAVENOUS
  Administered 2015-02-01: 11:00:00 via INTRAVENOUS

## 2015-02-01 MED ORDER — GLYCOPYRROLATE 0.2 MG/ML IJ SOLN
INTRAMUSCULAR | Status: AC
  Filled 2015-02-01: qty 1

## 2015-02-01 MED ORDER — SODIUM CHLORIDE 0.9 % IJ SOLN
INTRAMUSCULAR | Status: AC
  Filled 2015-02-01: qty 10

## 2015-02-01 MED ORDER — ONDANSETRON HCL 4 MG/2ML IJ SOLN
4.0000 mg | Freq: Once | INTRAMUSCULAR | Status: AC
  Administered 2015-02-01: 4 mg via INTRAVENOUS

## 2015-02-01 MED ORDER — ARTIFICIAL TEARS OP OINT
TOPICAL_OINTMENT | OPHTHALMIC | Status: DC | PRN
Start: 2015-02-01 — End: 2015-02-01
  Administered 2015-02-01: 1 via OPHTHALMIC

## 2015-02-01 MED ORDER — PROPOFOL 10 MG/ML IV BOLUS
INTRAVENOUS | Status: DC | PRN
  Administered 2015-02-01: 150 mg via INTRAVENOUS

## 2015-02-01 MED ORDER — LIDOCAINE HCL (CARDIAC) 20 MG/ML IV SOLN
INTRAVENOUS | Status: DC | PRN
  Administered 2015-02-01: 50 mg via INTRAVENOUS

## 2015-02-01 MED ORDER — ARTIFICIAL TEARS OP OINT: TOPICAL_OINTMENT | OPHTHALMIC | Status: DC | PRN

## 2015-02-01 MED ORDER — POVIDONE-IODINE 10 % EX OINT
TOPICAL_OINTMENT | CUTANEOUS | Status: AC
  Filled 2015-02-01: qty 1

## 2015-02-01 MED ORDER — HYDROMORPHONE HCL 1 MG/ML IJ SOLN
1.0000 mg | INTRAMUSCULAR | Status: DC | PRN
  Administered 2015-02-01 – 2015-02-03 (×7): 1 mg via INTRAVENOUS
  Filled 2015-02-01 (×7): qty 1

## 2015-02-01 MED ORDER — SODIUM CHLORIDE 0.9 % IR SOLN
Status: DC | PRN
  Administered 2015-02-01: 1000 mL

## 2015-02-01 MED ORDER — LACTATED RINGERS IV SOLN
INTRAVENOUS | Status: DC | PRN
  Administered 2015-02-01 (×3): via INTRAVENOUS

## 2015-02-01 MED ORDER — BUPIVACAINE LIPOSOME 1.3 % IJ SUSP
INTRAMUSCULAR | Status: AC
  Filled 2015-02-01: qty 20

## 2015-02-01 MED ORDER — PROPOFOL 10 MG/ML IV BOLUS
INTRAVENOUS | Status: AC
  Filled 2015-02-01: qty 20

## 2015-02-01 MED ORDER — DEXAMETHASONE SODIUM PHOSPHATE 4 MG/ML IJ SOLN
4.0000 mg | Freq: Once | INTRAMUSCULAR | Status: AC
  Administered 2015-02-01: 4 mg via INTRAVENOUS

## 2015-02-01 MED ORDER — OXYCODONE-ACETAMINOPHEN 5-325 MG PO TABS
1.0000 | ORAL_TABLET | ORAL | Status: DC | PRN
  Administered 2015-02-04 (×2): 1 via ORAL
  Administered 2015-02-04: 2 via ORAL
  Filled 2015-02-01 (×2): qty 1
  Filled 2015-02-01: qty 2

## 2015-02-01 MED ORDER — BUPIVACAINE LIPOSOME 1.3 % IJ SUSP
INTRAMUSCULAR | Status: DC | PRN
  Administered 2015-02-01: 20 mL

## 2015-02-01 MED ORDER — ONDANSETRON HCL 4 MG/2ML IJ SOLN: 4.0000 mg | Freq: Once | INTRAMUSCULAR | Status: DC | PRN

## 2015-02-01 MED ORDER — ARTIFICIAL TEARS OP OINT
TOPICAL_OINTMENT | OPHTHALMIC | Status: AC
  Filled 2015-02-01: qty 3.5

## 2015-02-01 MED ORDER — ROCURONIUM BROMIDE 100 MG/10ML IV SOLN
INTRAVENOUS | Status: DC | PRN
  Administered 2015-02-01: 25 mg via INTRAVENOUS
  Administered 2015-02-01: 10 mg via INTRAVENOUS

## 2015-02-01 MED ORDER — KETOROLAC TROMETHAMINE 30 MG/ML IJ SOLN
INTRAMUSCULAR | Status: AC
  Filled 2015-02-01: qty 1

## 2015-02-01 MED ORDER — LIDOCAINE HCL (PF) 1 % IJ SOLN
INTRAMUSCULAR | Status: AC
  Filled 2015-02-01: qty 5

## 2015-02-01 MED ORDER — FENTANYL CITRATE 0.05 MG/ML IJ SOLN
INTRAMUSCULAR | Status: DC | PRN
  Administered 2015-02-01 (×4): 50 ug via INTRAVENOUS
  Administered 2015-02-01: 100 ug via INTRAVENOUS
  Administered 2015-02-01 (×4): 50 ug via INTRAVENOUS

## 2015-02-01 MED ORDER — SUCCINYLCHOLINE CHLORIDE 20 MG/ML IJ SOLN
INTRAMUSCULAR | Status: DC | PRN
  Administered 2015-02-01: 120 mg via INTRAVENOUS

## 2015-02-01 MED ORDER — FENTANYL CITRATE 0.05 MG/ML IJ SOLN: 25.0000 ug | INTRAMUSCULAR | Status: DC | PRN

## 2015-02-01 MED ORDER — FENTANYL CITRATE 0.05 MG/ML IJ SOLN
INTRAMUSCULAR | Status: AC
  Filled 2015-02-01: qty 5

## 2015-02-01 MED ORDER — MIDAZOLAM HCL 2 MG/2ML IJ SOLN
1.0000 mg | INTRAMUSCULAR | Status: DC | PRN
  Administered 2015-02-01 (×2): 2 mg via INTRAVENOUS

## 2015-02-01 MED ORDER — LACTATED RINGERS IV SOLN
INTRAVENOUS | Status: DC
  Administered 2015-02-01 – 2015-02-02 (×2): via INTRAVENOUS

## 2015-02-01 MED ORDER — ONDANSETRON HCL 4 MG/2ML IJ SOLN
INTRAMUSCULAR | Status: AC
  Filled 2015-02-01: qty 2

## 2015-02-01 MED ORDER — POVIDONE-IODINE 10 % EX OINT
TOPICAL_OINTMENT | CUTANEOUS | Status: DC | PRN
  Administered 2015-02-01: 1 via TOPICAL

## 2015-02-01 SURGICAL SUPPLY — 59 items
APPLIER CLIP 11 MED OPEN (CLIP)
APPLIER CLIP 13 LRG OPEN (CLIP)
APR CLP LRG 13 20 CLIP (CLIP)
APR CLP MED 11 20 MLT OPN (CLIP)
BAG HAMPER (MISCELLANEOUS) ×2 IMPLANT
BARRIER SKIN 2 3/4 (OSTOMY) IMPLANT
BARRIER SKIN OD2.25 2 3/4 FLNG (OSTOMY) IMPLANT
BRR SKN FLT 2.75X2.25 2 PC (OSTOMY)
CHLORAPREP W/TINT 26ML (MISCELLANEOUS) ×2 IMPLANT
CLAMP POUCH DRAINAGE QUIET (OSTOMY) IMPLANT
CLIP APPLIE 11 MED OPEN (CLIP) IMPLANT
CLIP APPLIE 13 LRG OPEN (CLIP) IMPLANT
CLOTH BEACON ORANGE TIMEOUT ST (SAFETY) ×2 IMPLANT
COVER LIGHT HANDLE STERIS (MISCELLANEOUS) ×4 IMPLANT
DRAPE WARM FLUID 44X44 (DRAPE) ×1 IMPLANT
DRSG OPSITE POSTOP 4X10 (GAUZE/BANDAGES/DRESSINGS) ×2 IMPLANT
ELECT BLADE 6 FLAT ULTRCLN (ELECTRODE) IMPLANT
ELECT REM PT RETURN 9FT ADLT (ELECTROSURGICAL) ×2
ELECTRODE REM PT RTRN 9FT ADLT (ELECTROSURGICAL) ×1 IMPLANT
FORMALIN 10 PREFIL 120ML (MISCELLANEOUS) ×1 IMPLANT
GAUZE SPONGE 4X4 12PLY STRL (GAUZE/BANDAGES/DRESSINGS) ×2 IMPLANT
GLOVE SURG SS PI 7.5 STRL IVOR (GLOVE) ×6 IMPLANT
GOWN STRL REUS W/TWL LRG LVL3 (GOWN DISPOSABLE) ×6 IMPLANT
INST SET MAJOR GENERAL (KITS) ×2 IMPLANT
KIT REMOVER STAPLE SKIN (MISCELLANEOUS) IMPLANT
KIT ROOM TURNOVER APOR (KITS) ×2 IMPLANT
LIGASURE IMPACT 36 18CM CVD LR (INSTRUMENTS) IMPLANT
MANIFOLD NEPTUNE II (INSTRUMENTS) ×2 IMPLANT
NS IRRIG 1000ML POUR BTL (IV SOLUTION) ×2 IMPLANT
PACK ABDOMINAL MAJOR (CUSTOM PROCEDURE TRAY) ×2 IMPLANT
PAD ARMBOARD 7.5X6 YLW CONV (MISCELLANEOUS) ×2 IMPLANT
PENCIL HANDSWITCHING (ELECTRODE) ×1 IMPLANT
POUCH OSTOMY 2 3/4  H 3804 (WOUND CARE)
POUCH OSTOMY 2 3/4 H 3804 (WOUND CARE)
POUCH OSTOMY 2 PC DRNBL 2.75 (WOUND CARE) IMPLANT
RELOAD LINEAR CUT PROX 55 BLUE (ENDOMECHANICALS) IMPLANT
RELOAD PROXIMATE 75MM BLUE (ENDOMECHANICALS) IMPLANT
RELOAD STAPLE 55 3.8 BLU REG (ENDOMECHANICALS) IMPLANT
RELOAD STAPLE 75 3.8 BLU REG (ENDOMECHANICALS) IMPLANT
RETRACTOR WND ALEXIS 25 LRG (MISCELLANEOUS) IMPLANT
RTRCTR WOUND ALEXIS 25CM LRG (MISCELLANEOUS)
SET BASIN LINEN APH (SET/KITS/TRAYS/PACK) ×2 IMPLANT
SPONGE LAP 18X18 X RAY DECT (DISPOSABLE) ×3 IMPLANT
STAPLER GUN LINEAR PROX 60 (STAPLE) IMPLANT
STAPLER PROXIMATE 55 BLUE (STAPLE) IMPLANT
STAPLER PROXIMATE 75MM BLUE (STAPLE) IMPLANT
STAPLER VISISTAT (STAPLE) ×2 IMPLANT
SUCTION POOLE TIP (SUCTIONS) ×2 IMPLANT
SUT CHROMIC 0 SH (SUTURE) IMPLANT
SUT CHROMIC 2 0 SH (SUTURE) IMPLANT
SUT CHROMIC 3 0 SH 27 (SUTURE) ×2 IMPLANT
SUT ETHIBOND 0 MO6 C/R (SUTURE) ×1 IMPLANT
SUT NOVA NAB GS-26 0 60 (SUTURE) ×4 IMPLANT
SUT PROLENE 2 0 SH 30 (SUTURE) ×2 IMPLANT
SUT SILK 2 0 (SUTURE) ×2
SUT SILK 2 0 REEL (SUTURE) ×2 IMPLANT
SUT SILK 2-0 18XBRD TIE 12 (SUTURE) ×1 IMPLANT
SUT SILK 3 0 SH CR/8 (SUTURE) IMPLANT
TRAY FOLEY CATH 16FR SILVER (SET/KITS/TRAYS/PACK) ×2 IMPLANT

## 2015-02-01 NOTE — Anesthesia Procedure Notes (Signed)
Procedure Name: Intubation Date/Time: 02/01/2015 11:45 AM Performed by: Andree Elk, Ellar Hakala A Pre-anesthesia Checklist: Patient identified, Patient being monitored, Timeout performed, Emergency Drugs available and Suction available Patient Re-evaluated:Patient Re-evaluated prior to inductionOxygen Delivery Method: Circle System Utilized Preoxygenation: Pre-oxygenation with 100% oxygen Intubation Type: IV induction, Rapid sequence and Cricoid Pressure applied Ventilation: Mask ventilation without difficulty Laryngoscope Size: 3 and Miller Grade View: Grade I Tube type: Oral Tube size: 7.0 mm Number of attempts: 1 Airway Equipment and Method: Stylet Placement Confirmation: ETT inserted through vocal cords under direct vision,  positive ETCO2 and breath sounds checked- equal and bilateral Secured at: 21 cm Tube secured with: Tape Dental Injury: Teeth and Oropharynx as per pre-operative assessment

## 2015-02-01 NOTE — Transfer of Care (Signed)
Immediate Anesthesia Transfer of Care Note  Patient: Miranda Aguilar  Procedure(s) Performed: Procedure(s): EXPLORATORY LAPAROTOMY, MECKEL'S DIVERTICULECTOMY (N/A)  Patient Location: PACU  Anesthesia Type:General  Level of Consciousness: awake, oriented and patient cooperative  Airway & Oxygen Therapy: Patient Spontanous Breathing  Post-op Assessment: Report given to RN and Post -op Vital signs reviewed and stable  Post vital signs: Reviewed and stable  Last Vitals:  Filed Vitals:   02/01/15 1133  BP: 151/97  Pulse:   Temp:   Resp: 22    Complications: No apparent anesthesia complications

## 2015-02-01 NOTE — Op Note (Signed)
Patient:  Miranda Aguilar  DOB:  11-17-1961  MRN:  [id number]   Preop Diagnosis:  Gallstone ileus  Postop Diagnosis:  Same, incidental Meckel's diverticulum  Procedure:  Exploratory laparotomy, Meckel's diverticulectomy  Surgeon:  Aviva Signs, M.D.  Anes:  Gen. endotracheal  Indications:  Patient is a 54 year old white female who presented emergency room with worsening nausea and vomiting. She was found to have a gallstone ileus causing the obstruction. She also had renal insufficiency which has since resolved. She is now presenting to the operating room for exploratory laparotomy and removal of her gallstone. The risks and benefits of the procedure including bleeding, infection, cardiopulmonary difficulties, and the possibility of wound breakdown were fully explained to the patient, who gave informed consent.  Procedure note:  Patient was placed the supine position. After induction of general endotracheal anesthesia, the abdomen was prepped and draped using usual sterile technique with ChloraPrep. Surgical site confirmation was performed.  Midline incision was made from above the umbilicus to just below the umbilicus. The peritoneal cavity was entered into without difficulty. The small bowel was then run from the ligament of Treitz to the terminal ileum. During this inspection, a Meckel's diverticulum was found. The bowel was run twice and I could not initially find a gallstone. It did not area past the terminal ileum into the right colon. I thus milked the gallstone to the rectum. On the table KUB was performed to confirm that the gallstone had passed through the small bowel. Approximately 2 L of oral gastric tube output was noted at the time of surgery. I suspect the gallstone had recently passed into the ascending colon. The bowel was inspected a third time in the was no gallstone present within the small bowel. I elected to proceed with the incidental Meckel's diverticulectomy. A TA-30 was  placed across the base of the diverticulum and fired. The diverticulum was amputated off the stapler and sent to pathology further examination. The staple line was oversewn using 3-0 silk sutures. The bowel was returned into the abdominal cavity in an orderly fashion.  The umbilical hernia was closed using 0 Ethibond interrupted sutures. The fascia was reapproximated using a looped 0 Novafil running suture. The subcutaneous layer was reapproximated using 2-0 Vicryl interrupted sutures. Exparel was instilled the surrounding wound. The incision was closed using staples. Betadine ointment and dry sterile dressings were applied.  All tape and needle counts were correct at the end of the procedure. The patient was extubated in the operating room and transferred to PACU in stable condition.  Complications:  None  EBL:  50 mL  Specimen:  Meckel's diverticulum

## 2015-02-01 NOTE — Progress Notes (Signed)
[facility] PROGRESS NOTE  Miranda Aguilar HEN:[id number] DOB: 1961/01/01 DOA: 01/29/2015 PCP: No PCP Per Patient  Assessment/Plan: Gallstone ileus: with obstructing stone in small bowel verified by CT. Zosyn day #4. continues with nausea but no vomiting. NPO for surgery.  Continue phenergan and morphine.    Active Problems: Acute renal failure: related to dehydration secondary to decreased po intake and vomiting. Resolved with IV fluids. Monitor intake and output. follow  Hypokalemia: low end of normal this am. Mag level within the limits of normal  HTN: remote hx of same. Reports being on anti-hypertensive medication in past. Will start metoprolol with parameters.    Hyperglycemia: no hx of diabetes. A1c 6.0. CBG's trending down. Monitor and continue SSI.    Abdominal pain: related to above. Pain management as above.    1. Obesity:BMI 43.3 nutritional consult after surgery  Code Status: full Family Communication: none present Disposition Plan: home when ready   Consultants:  General surgery   Procedures:  none  Antibiotics:  Zosyn 01/29/15>>>  HPI/Subjective: Awake reports continued nausea and abdominal pain. No flatus  Objective: Filed Vitals:   02/01/15 1016  BP:   Pulse: 71  Temp: 98.2 F (36.8 C)  Resp: 18    Intake/Output Summary (Last 24 hours) at 02/01/15 1109 Last data filed at 01/31/15 1700  Gross per 24 hour  Intake 1146.25 ml  Output    300 ml  Net 846.25 ml   Filed Weights   01/29/15 1709 01/29/15 2250  Weight: 117.935 kg (260 lb) 121.564 kg (268 lb)    Exam:   General:  Obese appears comfortable  Cardiovascular: RRR no MGR no LE edema  Respiratory: normal effort BS clear bilaterally no wheeze  Abdomen: obese only slightly distended, very sluggish BS diffuse mild tenderness  Musculoskeletal: no clubbing or cyanosis   Data Reviewed: Basic Metabolic Panel:  Recent Labs Lab 01/29/15 1920 01/30/15 0520 01/31/15 0558  01/31/15 1030 01/31/15 1830 02/01/15 0603  NA 136 136 138  --  134* 138  K 3.1* 3.2* 3.1*  --  3.5 3.5  CL 88* 89* 92*  --  100 100  CO2 33* 32 31  --  31 32  GLUCOSE 205* 162* 162*  --  151* 145*  BUN 46* 55* 52*  --  37* 31*  CREATININE 2.78* 2.77* 1.41*  --  1.03 0.89  CALCIUM 10.6* 9.8 8.8  --  8.2* 8.7  MG  --   --   --  2.5  --   --    Liver Function Tests:  Recent Labs Lab 01/28/15 0720 01/29/15 1920 01/30/15 0520  AST 30 22 17   ALT 32 28 25  ALKPHOS 74 68 62  BILITOT 1.1 1.0 1.0  PROT 8.8* 10.2* 9.3*  ALBUMIN 4.7 5.0 4.5    Recent Labs Lab 01/28/15 0720  LIPASE 21   No results for input(s): AMMONIA in the last 168 hours. CBC:  Recent Labs Lab 01/28/15 0720 01/29/15 1920 01/30/15 0520 01/31/15 0558 02/01/15 0603  WBC 7.4 3.7* 4.3 7.6 5.9  NEUTROABS 5.7 2.1  --   --   --   HGB 15.9* 17.5* 16.7* 15.7* 14.6  HCT 46.0 50.0* 48.2* 46.6* 43.8  MCV 90.9 90.3 91.1 91.6 91.4  PLT 226 297 282 309 277   Cardiac Enzymes: No results for input(s): CKTOTAL, CKMB, CKMBINDEX, TROPONINI in the last 168 hours. BNP (last 3 results) No results for input(s): BNP in the last 8760 hours.  ProBNP (last 3  results) No results for input(s): PROBNP in the last 8760 hours.  CBG:  Recent Labs Lab 01/31/15 1139 01/31/15 1659 01/31/15 2100 02/01/15 0750 02/01/15 1026  GLUCAP 128* 148* 117* 138* 124*    Recent Results (from the past 240 hour(s))  Urine culture     Status: None   Collection Time: 01/29/15  8:23 PM  Result Value Ref Range Status   Specimen Description URINE, RANDOM  Final   Special Requests Normal  Final   Colony Count   Final    2,000 COLONIES/ML Performed at [facility]    Culture   Final    INSIGNIFICANT GROWTH Performed at [facility]    Report Status 01/31/2015 FINAL  Final  Surgical pcr screen     Status: None   Collection Time: 01/29/15 11:36 PM  Result Value Ref Range Status   MRSA, PCR NEGATIVE NEGATIVE Final    Staphylococcus aureus NEGATIVE NEGATIVE Final    Comment:        The Xpert SA Assay (FDA approved for NASAL specimens in patients over 54 years of age), is one component of a comprehensive surveillance program.  Test performance has been validated by [facility] for patients greater than or equal to 27 year old. It is not intended to diagnose infection nor to guide or monitor treatment.      Studies: Dg Chest 2 View  01/30/2015   CLINICAL DATA:  54 year old with hypertension and gallstone ileus.  EXAM: CHEST  2 VIEW  COMPARISON:  Abdominal CT 01/29/2015  FINDINGS: Two views of the chest demonstrate a few linear densities at the left lung base. These linear densities are suggestive for mild atelectasis. Otherwise, the lung bases are clear. Air-fluid level in the stomach. Heart size is normal. The trachea is midline. Negative for pleural effusions.  IMPRESSION: Mild left basilar atelectasis.   Electronically Signed   By: Markus Daft M.D.   On: 01/30/2015 15:33    Scheduled Meds: . [MAR Hold] antiseptic oral rinse  7 mL Mouth Rinse BID  . [MAR Hold] heparin subcutaneous  5,000 Units Subcutaneous 3 times per day  . [MAR Hold] insulin aspart  0-5 Units Subcutaneous QHS  . [MAR Hold] insulin aspart  0-9 Units Subcutaneous TID WC  . [MAR Hold] metoprolol  5 mg Intravenous 4 times per day  . [MAR Hold] piperacillin-tazobactam (ZOSYN)  IV  3.375 g Intravenous Q8H   Continuous Infusions: . 0.9 % NaCl with KCl 40 mEq / L 125 mL/hr (01/31/15 1844)  . lactated ringers 75 mL/hr at 02/01/15 1104    Principal Problem:   Gallstone ileus Active Problems:   Abdominal pain   Obesity   Hypokalemia   Hyperglycemia   Acute renal failure   Abdominal pain in female    Time spent: 23 minutes    [place] [facility] Pager [phone]. If 7PM-7AM, please contact night-coverage at [email or web address], password [facility] 02/01/2015, 11:09 AM  LOS: 3 days

## 2015-02-01 NOTE — Anesthesia Postprocedure Evaluation (Signed)
  Anesthesia Post-op Note  Patient: Miranda Aguilar  Procedure(s) Performed: Procedure(s): EXPLORATORY LAPAROTOMY, MECKEL'S DIVERTICULECTOMY (N/A)  Patient Location: PACU  Anesthesia Type:General  Level of Consciousness: awake, alert , oriented and patient cooperative  Airway and Oxygen Therapy: Patient Spontanous Breathing and Patient connected to face mask oxygen  Post-op Pain: mild  Post-op Assessment: Post-op Vital signs reviewed, Patient's Cardiovascular Status Stable, Respiratory Function Stable, Patent Airway, No signs of Nausea or vomiting and Pain level controlled  Post-op Vital Signs: Reviewed and stable  Last Vitals:  Filed Vitals:   02/01/15 1345  BP:   Pulse: 90  Temp:   Resp: 12    Complications: No apparent anesthesia complications

## 2015-02-01 NOTE — Anesthesia Preprocedure Evaluation (Addendum)
Anesthesia Evaluation  Patient identified by MRN, date of birth, ID band Patient awake    Reviewed: Allergy & Precautions, NPO status , Patient's Chart, lab work & pertinent test results  Airway Mallampati: II  TM Distance: >3 FB     Dental  (+) Teeth Intact, Implants, Dental Advisory Given, Caps,    Pulmonary neg pulmonary ROS,  breath sounds clear to auscultation        Cardiovascular Rhythm:Regular Rate:Normal     Neuro/Psych    GI/Hepatic SBO, gallstone illeus    Endo/Other  Morbid obesity  Renal/GU Renal disease (hx ARF)     Musculoskeletal   Abdominal (+) + obese,   Peds  Hematology   Anesthesia Other Findings   Reproductive/Obstetrics                            Anesthesia Physical Anesthesia Plan  ASA: III  Anesthesia Plan: General   Post-op Pain Management:    Induction: Intravenous, Rapid sequence and Cricoid pressure planned  Airway Management Planned: Oral ETT  Additional Equipment:   Intra-op Plan:   Post-operative Plan: Extubation in OR  Informed Consent: I have reviewed the patients History and Physical, chart, labs and discussed the procedure including the risks, benefits and alternatives for the proposed anesthesia with the patient or authorized representative who has indicated his/her understanding and acceptance.     Plan Discussed with:   Anesthesia Plan Comments:         Anesthesia Quick Evaluation

## 2015-02-01 NOTE — Addendum Note (Signed)
Addendum  created 02/01/15 1412 by Mickel Baas, CRNA   Modules edited: Charges VN

## 2015-02-01 NOTE — Progress Notes (Signed)
Dr Arnoldo Morale aware pt had heparin 5000 units  at 600

## 2015-02-02 ENCOUNTER — Encounter (HOSPITAL_COMMUNITY): Payer: Self-pay | Admitting: General Surgery

## 2015-02-02 LAB — BASIC METABOLIC PANEL
ANION GAP: 5 (ref 5–15)
BUN: 37 mg/dL — AB (ref 6–23)
CALCIUM: 8.2 mg/dL — AB (ref 8.4–10.5)
CHLORIDE: 97 mmol/L (ref 96–112)
CO2: 32 mmol/L (ref 19–32)
Creatinine, Ser: 0.93 mg/dL (ref 0.50–1.10)
GFR calc Af Amer: 80 mL/min — ABNORMAL LOW (ref >90)
GFR calc non Af Amer: 69 mL/min — ABNORMAL LOW (ref >90)
Glucose, Bld: 143 mg/dL — ABNORMAL HIGH (ref 70–99)
Potassium: 3.6 mmol/L (ref 3.5–5.1)
Sodium: 134 mmol/L — ABNORMAL LOW (ref 135–145)

## 2015-02-02 LAB — GLUCOSE, CAPILLARY
GLUCOSE-CAPILLARY: 125 mg/dL — AB (ref 70–99)
Glucose-Capillary: 126 mg/dL — ABNORMAL HIGH (ref 70–99)
Glucose-Capillary: 137 mg/dL — ABNORMAL HIGH (ref 70–99)
Glucose-Capillary: 155 mg/dL — ABNORMAL HIGH (ref 70–99)
Glucose-Capillary: 139 mg/dL — ABNORMAL HIGH (ref 70–99)

## 2015-02-02 LAB — CBC
HCT: 41.8 % (ref 36.0–46.0)
Hemoglobin: 14.2 g/dL (ref 12.0–15.0)
MCH: 30.9 pg (ref 26.0–34.0)
MCHC: 34 g/dL (ref 30.0–36.0)
MCV: 90.9 fL (ref 78.0–100.0)
Platelets: 258 10*3/uL (ref 150–400)
RBC: 4.6 MIL/uL (ref 3.87–5.11)
RDW: 12.7 % (ref 11.5–15.5)
WBC: 7.9 10*3/uL (ref 4.0–10.5)

## 2015-02-02 LAB — PHOSPHORUS: Phosphorus: 3 mg/dL (ref 2.3–4.6)

## 2015-02-02 LAB — MAGNESIUM: Magnesium: 2.3 mg/dL (ref 1.5–2.5)

## 2015-02-02 MED ORDER — METOCLOPRAMIDE HCL 5 MG/ML IJ SOLN
10.0000 mg | Freq: Four times a day (QID) | INTRAMUSCULAR | Status: AC
  Administered 2015-02-02 – 2015-02-04 (×7): 10 mg via INTRAVENOUS
  Filled 2015-02-02 (×7): qty 2

## 2015-02-02 MED ORDER — KCL IN DEXTROSE-NACL 20-5-0.45 MEQ/L-%-% IV SOLN
INTRAVENOUS | Status: DC
  Administered 2015-02-02 – 2015-02-03 (×2): via INTRAVENOUS

## 2015-02-02 NOTE — Progress Notes (Signed)
1 Day Post-Op  Subjective: Still with some nausea. No bowel movement yet.  Objective: Vital signs in last 24 hours: Temp:  [97.6 F (36.4 C)-99.2 F (37.3 C)] 98.7 F (37.1 C) (02/25 0554) Pulse Rate:  [71-98] 98 (02/25 0554) Resp:  [12-25] 20 (02/25 0554) BP: (119-164)/(76-109) 147/97 mmHg (02/25 0554) SpO2:  [92 %-98 %] 92 % (02/25 0554) Last BM Date: 01/27/15  Intake/Output from previous day: 02/24 0701 - 02/25 0700 In: 5451.3 [P.O.:120; I.V.:5331.3] Out: 3600 [Urine:550; Blood:50] Intake/Output this shift:    General appearance: alert, cooperative and no distress Resp: clear to auscultation bilaterally Cardio: regular rate and rhythm, S1, S2 normal, no murmur, click, rub or gallop GI: Soft, incision healing well. Occasional bowel sounds appreciated.  Lab Results:   Recent Labs  02/01/15 0603 02/02/15 0707  WBC 5.9 7.9  HGB 14.6 14.2  HCT 43.8 41.8  PLT 277 258   BMET  Recent Labs  02/01/15 0603 02/02/15 0707  NA 138 134*  K 3.5 3.6  CL 100 97  CO2 32 32  GLUCOSE 145* 143*  BUN 31* 37*  CREATININE 0.89 0.93  CALCIUM 8.7 8.2*   PT/INR No results for input(s): LABPROT, INR in the last 72 hours.  Studies/Results: Dg Abd Portable 1v  02/01/2015   CLINICAL DATA:  54 year old female with gallstone ileus. Query transit of gallstone. Subsequent encounter.  EXAM: PORTABLE ABDOMEN - 1 VIEW  COMPARISON:  CT Abdomen and Pelvis 01/29/2015.  FINDINGS: 2 portable supine views of the abdomen. The lamellated nearly 3 cm gallstone now projects in the right upper quadrant, versus the right lower quadrant at the time of the prior study. There are continued gas-filled mildly dilated small bowel loops throughout the abdomen. There is proximal and transverse colon gas. Enteric tube in place, side hole at the proximal gastric body. No acute osseous abnormality identified.  IMPRESSION: 1. 2.7 cm gallstone projects in the right upper abdomen today, but suspected to be within  distal small bowel rather than proximal colon. 2. Continued ileus bowel gas pattern. 3. Enteric tube placed within the stomach.   Electronically Signed   By: Genevie Ann M.D.   On: 02/01/2015 12:59    Anti-infectives: Anti-infectives    Start     Dose/Rate Route Frequency Ordered Stop   01/30/15 0600  piperacillin-tazobactam (ZOSYN) IVPB 3.375 g     3.375 g 12.5 mL/hr over 240 Minutes Intravenous Every 8 hours 01/29/15 2207     01/29/15 2145  piperacillin-tazobactam (ZOSYN) IVPB 3.375 g     3.375 g 100 mL/hr over 30 Minutes Intravenous  Once 01/29/15 2137 01/29/15 2226      Assessment/Plan: s/p Procedure(s): EXPLORATORY LAPAROTOMY, MECKEL'S DIVERTICULECTOMY Impression: Stable on postoperative day 1. Renal function good. Awaiting return of bowel function. Will get patient up in chair. Adjust IV fluid.  LOS: 4 days    Jafeth Mustin A 02/02/2015

## 2015-02-02 NOTE — Addendum Note (Signed)
Addendum  created 02/02/15 1009 by Ollen Bowl, CRNA   Modules edited: Notes Section   Notes Section:  File: [id number]

## 2015-02-02 NOTE — Care Management Utilization Note (Signed)
UR completed 

## 2015-02-02 NOTE — Anesthesia Postprocedure Evaluation (Signed)
  Anesthesia Post-op Note  Patient: Miranda Aguilar  Procedure(s) Performed: Procedure(s): EXPLORATORY LAPAROTOMY, MECKEL'S DIVERTICULECTOMY (N/A)  Patient Location: Nursing Unit  Anesthesia Type:General  Level of Consciousness: awake, alert  and oriented  Airway and Oxygen Therapy: Patient Spontanous Breathing and Patient connected to nasal cannula oxygen  Post-op Pain: mild  Post-op Assessment: Post-op Vital signs reviewed, Patient's Cardiovascular Status Stable, Respiratory Function Stable and NAUSEA AND VOMITING PRESENT  Post-op Vital Signs: Reviewed and stable  Last Vitals:  Filed Vitals:   02/02/15 0554  BP: 147/97  Pulse: 98  Temp: 37.1 C  Resp: 20    Complications: No apparent anesthesia complications, Patient continues to have N/V, but this has been an ongoing problem since admission, Dr. Arnoldo Morale aware.

## 2015-02-03 LAB — BASIC METABOLIC PANEL
ANION GAP: 11 (ref 5–15)
BUN: 26 mg/dL — ABNORMAL HIGH (ref 6–23)
CALCIUM: 8.8 mg/dL (ref 8.4–10.5)
CO2: 32 mmol/L (ref 19–32)
CREATININE: 0.84 mg/dL (ref 0.50–1.10)
Chloride: 90 mmol/L — ABNORMAL LOW (ref 96–112)
GFR calc Af Amer: >90 mL/min (ref >90)
GFR, EST NON AFRICAN AMERICAN: 78 mL/min — AB (ref >90)
Glucose, Bld: 222 mg/dL — ABNORMAL HIGH (ref 70–99)
POTASSIUM: 3.4 mmol/L — AB (ref 3.5–5.1)
Sodium: 133 mmol/L — ABNORMAL LOW (ref 135–145)

## 2015-02-03 LAB — CBC
HEMATOCRIT: 42.6 % (ref 36.0–46.0)
Hemoglobin: 14.5 g/dL (ref 12.0–15.0)
MCH: 30.8 pg (ref 26.0–34.0)
MCHC: 34 g/dL (ref 30.0–36.0)
MCV: 90.4 fL (ref 78.0–100.0)
Platelets: 301 10*3/uL (ref 150–400)
RBC: 4.71 MIL/uL (ref 3.87–5.11)
RDW: 12.5 % (ref 11.5–15.5)
WBC: 9 10*3/uL (ref 4.0–10.5)

## 2015-02-03 LAB — GLUCOSE, CAPILLARY
GLUCOSE-CAPILLARY: 185 mg/dL — AB (ref 70–99)
Glucose-Capillary: 167 mg/dL — ABNORMAL HIGH (ref 70–99)
Glucose-Capillary: 149 mg/dL — ABNORMAL HIGH (ref 70–99)
Glucose-Capillary: 155 mg/dL — ABNORMAL HIGH (ref 70–99)

## 2015-02-03 LAB — PHOSPHORUS: Phosphorus: 1.7 mg/dL — ABNORMAL LOW (ref 2.3–4.6)

## 2015-02-03 LAB — MAGNESIUM: Magnesium: 2.3 mg/dL (ref 1.5–2.5)

## 2015-02-03 MED ORDER — BISACODYL 10 MG RE SUPP
10.0000 mg | Freq: Two times a day (BID) | RECTAL | Status: DC
  Administered 2015-02-03 (×2): 10 mg via RECTAL
  Filled 2015-02-03 (×3): qty 1

## 2015-02-03 MED ORDER — FENTANYL CITRATE 0.05 MG/ML IJ SOLN
50.0000 ug | INTRAMUSCULAR | Status: DC | PRN
  Administered 2015-02-03 (×2): 50 ug via INTRAVENOUS
  Filled 2015-02-03 (×3): qty 2

## 2015-02-03 MED ORDER — POTASSIUM PHOSPHATES 15 MMOLE/5ML IV SOLN
20.0000 mmol | Freq: Once | INTRAVENOUS | Status: AC
  Administered 2015-02-03: 20 mmol via INTRAVENOUS
  Filled 2015-02-03: qty 6.67

## 2015-02-03 MED ORDER — PANTOPRAZOLE SODIUM 40 MG PO TBEC
40.0000 mg | DELAYED_RELEASE_TABLET | Freq: Every day | ORAL | Status: DC
  Administered 2015-02-03 – 2015-02-05 (×3): 40 mg via ORAL
  Filled 2015-02-03 (×3): qty 1

## 2015-02-03 MED ORDER — KCL IN DEXTROSE-NACL 40-5-0.45 MEQ/L-%-% IV SOLN
INTRAVENOUS | Status: DC
  Administered 2015-02-03 – 2015-02-04 (×2): via INTRAVENOUS

## 2015-02-03 NOTE — Progress Notes (Signed)
2 Days Post-Op  Subjective: Still with some nausea. Mild incisional pain.  Objective: Vital signs in last 24 hours: Temp:  [98 F (36.7 C)-99.3 F (37.4 C)] 98.1 F (36.7 C) (02/26 0607) Pulse Rate:  [74-99] 95 (02/26 0642) Resp:  [20] 20 (02/26 0607) BP: (129-156)/(79-101) 148/95 mmHg (02/26 0642) SpO2:  [91 %-96 %] 96 % (02/26 0642) Last BM Date: 01/27/15  Intake/Output from previous day: 02/25 0701 - 02/26 0700 In: 600 [P.O.:600] Out: 1225 [Urine:900; Emesis/NG output:325] Intake/Output this shift: Total I/O In: 1315 [I.V.:1215; IV Piggyback:100] Out: -   General appearance: alert, cooperative and no distress Resp: clear to auscultation bilaterally Cardio: regular rate and rhythm, S1, S2 normal, no murmur, click, rub or gallop GI: Soft, minimal bowel sounds appreciated. Incision healing well.  Lab Results:   Recent Labs  02/02/15 0707 02/03/15 0558  WBC 7.9 9.0  HGB 14.2 14.5  HCT 41.8 42.6  PLT 258 301   BMET  Recent Labs  02/02/15 0707 02/03/15 0558  NA 134* 133*  K 3.6 3.4*  CL 97 90*  CO2 32 32  GLUCOSE 143* 222*  BUN 37* 26*  CREATININE 0.93 0.84  CALCIUM 8.2* 8.8   PT/INR No results for input(s): LABPROT, INR in the last 72 hours.  Studies/Results: Dg Abd Portable 1v  02/01/2015   CLINICAL DATA:  54 year old female with gallstone ileus. Query transit of gallstone. Subsequent encounter.  EXAM: PORTABLE ABDOMEN - 1 VIEW  COMPARISON:  CT Abdomen and Pelvis 01/29/2015.  FINDINGS: 2 portable supine views of the abdomen. The lamellated nearly 3 cm gallstone now projects in the right upper quadrant, versus the right lower quadrant at the time of the prior study. There are continued gas-filled mildly dilated small bowel loops throughout the abdomen. There is proximal and transverse colon gas. Enteric tube in place, side hole at the proximal gastric body. No acute osseous abnormality identified.  IMPRESSION: 1. 2.7 cm gallstone projects in the right upper  abdomen today, but suspected to be within distal small bowel rather than proximal colon. 2. Continued ileus bowel gas pattern. 3. Enteric tube placed within the stomach.   Electronically Signed   By: Genevie Ann M.D.   On: 02/01/2015 12:59    Anti-infectives: Anti-infectives    Start     Dose/Rate Route Frequency Ordered Stop   01/30/15 0600  piperacillin-tazobactam (ZOSYN) IVPB 3.375 g     3.375 g 12.5 mL/hr over 240 Minutes Intravenous Every 8 hours 01/29/15 2207     01/29/15 2145  piperacillin-tazobactam (ZOSYN) IVPB 3.375 g     3.375 g 100 mL/hr over 30 Minutes Intravenous  Once 01/29/15 2137 01/29/15 2226      Assessment/Plan: s/p Procedure(s): EXPLORATORY LAPAROTOMY, MECKEL'S DIVERTICULECTOMY Impression: Stable on postoperative day 2, awaiting return of bowel function. Mild hypophosphatemia noted. Will supplement. Awaiting return of bowel function.  LOS: 5 days    Cyris Maalouf A 02/03/2015

## 2015-02-03 NOTE — Plan of Care (Signed)
Problem: Phase I Progression Outcomes Goal: Pain controlled with appropriate interventions Outcome: Progressing Medicated as needed for pain      

## 2015-02-03 NOTE — Progress Notes (Signed)
Patient voiding since foley removed. Out of bed sitting in chair. Ambulated in hallway tolerated well.

## 2015-02-03 NOTE — Plan of Care (Signed)
Problem: Phase I Progression Outcomes Goal: Voiding-avoid urinary catheter unless indicated Outcome: Progressing Foley catheter removed

## 2015-02-03 NOTE — Progress Notes (Signed)
MEDICATION RELATED CONSULT NOTE - INITIAL   Pharmacy Consult for Phosphorus replacement Indication: Hypophosphatemia  No Known Allergies  Patient Measurements: Height: 5\' 6"  (167.6 cm) Weight: 268 lb (121.564 kg) IBW/kg (Calculated) : 59.3  Vital Signs: Temp: 98.1 F (36.7 C) (02/26 0607) Temp Source: Oral (02/26 0607) BP: 148/95 mmHg (02/26 0642) Pulse Rate: 95 (02/26 0642) Intake/Output from previous day: 02/25 0701 - 02/26 0700 In: 600 [P.O.:600] Out: 1225 [Urine:900; Emesis/NG output:325] Intake/Output from this shift: Total I/O In: 1315 [I.V.:1215; IV Piggyback:100] Out: -   Labs:  Recent Labs  02/01/15 0603 02/02/15 0707 02/03/15 0558  WBC 5.9 7.9 9.0  HGB 14.6 14.2 14.5  HCT 43.8 41.8 42.6  PLT 277 258 301  CREATININE 0.89 0.93 0.84  MG  --  2.3 2.3  PHOS  --  3.0 1.7*   Estimated Creatinine Clearance: 103 mL/min (by C-G formula based on Cr of 0.84).  Microbiology: Recent Results (from the past 720 hour(s))  Urine culture     Status: None   Collection Time: 01/29/15  8:23 PM  Result Value Ref Range Status   Specimen Description URINE, RANDOM  Final   Special Requests Normal  Final   Colony Count   Final    2,000 COLONIES/ML Performed at [facility]    Culture   Final    INSIGNIFICANT GROWTH Performed at [facility]    Report Status 01/31/2015 FINAL  Final  Surgical pcr screen     Status: None   Collection Time: 01/29/15 11:36 PM  Result Value Ref Range Status   MRSA, PCR NEGATIVE NEGATIVE Final   Staphylococcus aureus NEGATIVE NEGATIVE Final    Comment:        The Xpert SA Assay (FDA approved for NASAL specimens in patients over 54 years of age), is one component of a comprehensive surveillance program.  Test performance has been validated by [facility] for patients greater than or equal to 41 year old. It is not intended to diagnose infection nor to guide or monitor treatment.    Medical History: Past Medical  History  Diagnosis Date  . Gallstone ileus   . Acute renal failure   . Hypertension     01/2015  . Obesity    Assessment: 54yo female 2 days postop exp lap.  Asked to replace phosphorus.  K+ also slightly low.  Pt is obese with good renal fxn.  Goal of Therapy:  Replace phosphorus to WNL  Plan:  K-Phos 78mMols IV today x 1 F/U labs tomorrow am  Ena Dawley 02/03/2015,11:01 AM

## 2015-02-04 LAB — BASIC METABOLIC PANEL
Anion gap: 4 — ABNORMAL LOW (ref 5–15)
BUN: 17 mg/dL (ref 6–23)
CALCIUM: 7.9 mg/dL — AB (ref 8.4–10.5)
CO2: 32 mmol/L (ref 19–32)
Chloride: 95 mmol/L — ABNORMAL LOW (ref 96–112)
Creatinine, Ser: 0.65 mg/dL (ref 0.50–1.10)
GFR calc non Af Amer: >90 mL/min (ref >90)
Glucose, Bld: 148 mg/dL — ABNORMAL HIGH (ref 70–99)
Potassium: 2.9 mmol/L — ABNORMAL LOW (ref 3.5–5.1)
Sodium: 131 mmol/L — ABNORMAL LOW (ref 135–145)

## 2015-02-04 LAB — GLUCOSE, CAPILLARY
Glucose-Capillary: 151 mg/dL — ABNORMAL HIGH (ref 70–99)
Glucose-Capillary: 119 mg/dL — ABNORMAL HIGH (ref 70–99)
Glucose-Capillary: 125 mg/dL — ABNORMAL HIGH (ref 70–99)
Glucose-Capillary: 120 mg/dL — ABNORMAL HIGH (ref 70–99)

## 2015-02-04 LAB — PHOSPHORUS: PHOSPHORUS: 1.4 mg/dL — AB (ref 2.3–4.6)

## 2015-02-04 MED ORDER — METOPROLOL SUCCINATE ER 50 MG PO TB24
50.0000 mg | ORAL_TABLET | Freq: Every day | ORAL | Status: DC
  Administered 2015-02-04 – 2015-02-05 (×2): 50 mg via ORAL
  Filled 2015-02-04 (×2): qty 1

## 2015-02-04 MED ORDER — METOPROLOL TARTRATE 1 MG/ML IV SOLN: 5.0000 mg | Freq: Four times a day (QID) | INTRAVENOUS | Status: DC | PRN

## 2015-02-04 MED ORDER — POTASSIUM PHOSPHATES 15 MMOLE/5ML IV SOLN
20.0000 mmol | Freq: Once | INTRAVENOUS | Status: AC
  Administered 2015-02-04: 20 mmol via INTRAVENOUS
  Filled 2015-02-04: qty 6.67

## 2015-02-04 MED ORDER — POTASSIUM CHLORIDE 10 MEQ/100ML IV SOLN
10.0000 meq | INTRAVENOUS | Status: AC
  Administered 2015-02-04 (×4): 10 meq via INTRAVENOUS
  Filled 2015-02-04: qty 100

## 2015-02-04 NOTE — Progress Notes (Signed)
MEDICATION RELATED CONSULT NOTE - follow up  Pharmacy Consult for Phosphorus replacement Indication: Hypophosphatemia  No Known Allergies  Patient Measurements: Height: 5\' 6"  (167.6 cm) Weight: 268 lb (121.564 kg) IBW/kg (Calculated) : 59.3  Vital Signs: Temp: 97.7 F (36.5 C) (02/27 0707) Temp Source: Oral (02/27 0707) BP: 158/90 mmHg (02/27 0707) Pulse Rate: 82 (02/27 0707) Intake/Output from previous day: 02/26 0701 - 02/27 0700 In: 1315 [I.V.:1215; IV Piggyback:100] Out: 300 [Urine:300] Intake/Output from this shift:    Labs:  Recent Labs  02/02/15 0707 02/03/15 0558 02/04/15 0628  WBC 7.9 9.0  --   HGB 14.2 14.5  --   HCT 41.8 42.6  --   PLT 258 301  --   CREATININE 0.93 0.84 0.65  MG 2.3 2.3  --   PHOS 3.0 1.7* 1.4*   Estimated Creatinine Clearance: 108.1 mL/min (by C-G formula based on Cr of 0.65).  Microbiology: Recent Results (from the past 720 hour(s))  Urine culture     Status: None   Collection Time: 01/29/15  8:23 PM  Result Value Ref Range Status   Specimen Description URINE, RANDOM  Final   Special Requests Normal  Final   Colony Count   Final    2,000 COLONIES/ML Performed at [facility]    Culture   Final    INSIGNIFICANT GROWTH Performed at [facility]    Report Status 01/31/2015 FINAL  Final  Surgical pcr screen     Status: None   Collection Time: 01/29/15 11:36 PM  Result Value Ref Range Status   MRSA, PCR NEGATIVE NEGATIVE Final   Staphylococcus aureus NEGATIVE NEGATIVE Final    Comment:        The Xpert SA Assay (FDA approved for NASAL specimens in patients over 6 years of age), is one component of a comprehensive surveillance program.  Test performance has been validated by [facility] for patients greater than or equal to 57 year old. It is not intended to diagnose infection nor to guide or monitor treatment.    Medical History: Past Medical History  Diagnosis Date  . Gallstone ileus   . Acute  renal failure   . Hypertension     01/2015  . Obesity    Assessment: 54yo female 2 days postop exp lap.  Asked to replace phosphorus.  K+ also low.  Pt received KPhos 84mMols yesterday and K+ and Phos low today despite Rx.  Pt is obese with good renal fxn.  Goal of Therapy:  Replace phosphorus to WNL  Plan:  K-Phos 32mMols IV today x 1 F/U labs tomorrow am  Hart Robinsons A 02/04/2015,9:25 AM

## 2015-02-04 NOTE — Progress Notes (Signed)
3 Days Post-Op  Subjective: Had multiple bowel movements yesterday. Feels much better.  Objective: Vital signs in last 24 hours: Temp:  [97.7 F (36.5 C)-99.4 F (37.4 C)] 97.7 F (36.5 C) (02/27 0707) Pulse Rate:  [82-104] 82 (02/27 0707) Resp:  [20] 20 (02/27 0707) BP: (143-158)/(85-97) 158/90 mmHg (02/27 0707) SpO2:  [92 %-95 %] 95 % (02/27 0707) Last BM Date: 01/27/15  Intake/Output from previous day: 02/26 0701 - 02/27 0700 In: 1315 [I.V.:1215; IV Piggyback:100] Out: 300 [Urine:300] Intake/Output this shift:    General appearance: alert, cooperative and no distress Resp: clear to auscultation bilaterally Cardio: regular rate and rhythm, S1, S2 normal, no murmur, click, rub or gallop GI: Soft. Incision healing well. Bowel sounds active.  Lab Results:   Recent Labs  02/02/15 0707 02/03/15 0558  WBC 7.9 9.0  HGB 14.2 14.5  HCT 41.8 42.6  PLT 258 301   BMET  Recent Labs  02/03/15 0558 02/04/15 0628  NA 133* 131*  K 3.4* 2.9*  CL 90* 95*  CO2 32 32  GLUCOSE 222* 148*  BUN 26* 17  CREATININE 0.84 0.65  CALCIUM 8.8 7.9*   PT/INR No results for input(s): LABPROT, INR in the last 72 hours.  Studies/Results: No results found.  Anti-infectives: Anti-infectives    Start     Dose/Rate Route Frequency Ordered Stop   01/30/15 0600  piperacillin-tazobactam (ZOSYN) IVPB 3.375 g     3.375 g 12.5 mL/hr over 240 Minutes Intravenous Every 8 hours 01/29/15 2207     01/29/15 2145  piperacillin-tazobactam (ZOSYN) IVPB 3.375 g     3.375 g 100 mL/hr over 30 Minutes Intravenous  Once 01/29/15 2137 01/29/15 2226      Assessment/Plan: s/p Procedure(s): EXPLORATORY LAPAROTOMY, MECKEL'S DIVERTICULECTOMY Impression: Bowel function has returned. We'll advance diet as tolerated. Still has both hypokalemia and hypophosphatemia. These are being treated.  LOS: 6 days    Miranda Aguilar A 02/04/2015

## 2015-02-05 LAB — BASIC METABOLIC PANEL
Anion gap: 0 — ABNORMAL LOW (ref 5–15)
BUN: 13 mg/dL (ref 6–23)
CALCIUM: 7.3 mg/dL — AB (ref 8.4–10.5)
CO2: 28 mmol/L (ref 19–32)
Chloride: 103 mmol/L (ref 96–112)
Creatinine, Ser: 0.62 mg/dL (ref 0.50–1.10)
Glucose, Bld: 115 mg/dL — ABNORMAL HIGH (ref 70–99)
Potassium: 3.1 mmol/L — ABNORMAL LOW (ref 3.5–5.1)
Sodium: 130 mmol/L — ABNORMAL LOW (ref 135–145)

## 2015-02-05 LAB — GLUCOSE, CAPILLARY
GLUCOSE-CAPILLARY: 113 mg/dL — AB (ref 70–99)
Glucose-Capillary: 115 mg/dL — ABNORMAL HIGH (ref 70–99)

## 2015-02-05 LAB — PHOSPHORUS: Phosphorus: 1.9 mg/dL — ABNORMAL LOW (ref 2.3–4.6)

## 2015-02-05 MED ORDER — METOPROLOL SUCCINATE ER 50 MG PO TB24: 50.0000 mg | ORAL_TABLET | Freq: Every day | ORAL | Status: DC

## 2015-02-05 MED ORDER — HYDROCODONE-ACETAMINOPHEN 5-325 MG PO TABS: 1.0000 | ORAL_TABLET | Freq: Four times a day (QID) | ORAL | Status: DC | PRN

## 2015-02-05 MED ORDER — POTASSIUM CHLORIDE ER 20 MEQ PO TBCR: 10.0000 meq | EXTENDED_RELEASE_TABLET | Freq: Two times a day (BID) | ORAL | Status: DC

## 2015-02-05 MED ORDER — POTASSIUM PHOSPHATES 15 MMOLE/5ML IV SOLN
30.0000 mmol | Freq: Once | INTRAVENOUS | Status: DC
  Filled 2015-02-05: qty 10

## 2015-02-05 MED ORDER — POTASSIUM CHLORIDE ER 20 MEQ PO TBCR: 20.0000 meq | EXTENDED_RELEASE_TABLET | Freq: Every morning | ORAL | Status: DC

## 2015-02-05 NOTE — Progress Notes (Signed)
AVS reviewed with patient. Verbalized understanding of discharge instructions, medications, and follow up appointments. IV within normal limits. Patient stable at time of discharge.

## 2015-02-05 NOTE — Progress Notes (Signed)
MEDICATION RELATED CONSULT NOTE - follow up  Pharmacy Consult for Phosphorus replacement Indication: Hypophosphatemia  No Known Allergies  Patient Measurements: Height: 5\' 6"  (167.6 cm) Weight: 268 lb (121.564 kg) IBW/kg (Calculated) : 59.3  Vital Signs: Temp: 98 F (36.7 C) (02/28 0533) Temp Source: Oral (02/28 0533) BP: 140/70 mmHg (02/28 0533) Pulse Rate: 68 (02/28 0533) Intake/Output from previous day: 02/27 0701 - 02/28 0700 In: 1758.3 [P.O.:480; I.V.:571.7; IV Piggyback:706.7] Out: -  Intake/Output from this shift:    Labs:  Recent Labs  02/03/15 0558 02/04/15 0628 02/05/15 0628  WBC 9.0  --   --   HGB 14.5  --   --   HCT 42.6  --   --   PLT 301  --   --   CREATININE 0.84 0.65 0.62  MG 2.3  --   --   PHOS 1.7* 1.4* 1.9*   Estimated Creatinine Clearance: 108.1 mL/min (by C-G formula based on Cr of 0.62).  Microbiology: Recent Results (from the past 720 hour(s))  Urine culture     Status: None   Collection Time: 01/29/15  8:23 PM  Result Value Ref Range Status   Specimen Description URINE, RANDOM  Final   Special Requests Normal  Final   Colony Count   Final    2,000 COLONIES/ML Performed at [facility]    Culture   Final    INSIGNIFICANT GROWTH Performed at [facility]    Report Status 01/31/2015 FINAL  Final  Surgical pcr screen     Status: None   Collection Time: 01/29/15 11:36 PM  Result Value Ref Range Status   MRSA, PCR NEGATIVE NEGATIVE Final   Staphylococcus aureus NEGATIVE NEGATIVE Final    Comment:        The Xpert SA Assay (FDA approved for NASAL specimens in patients over 60 years of age), is one component of a comprehensive surveillance program.  Test performance has been validated by [facility] for patients greater than or equal to 26 year old. It is not intended to diagnose infection nor to guide or monitor treatment.    Medical History: Past Medical History  Diagnosis Date  . Gallstone ileus   .  Acute renal failure   . Hypertension     01/2015  . Obesity    Assessment: 54yo female 2 days postop exp lap.  Asked to replace phosphorus.  K+ also low.  Pt received KPhos 89mMols yesterday and K+ and Phos remains low today despite Rx.  Pt is obese with good renal fxn.  Goal of Therapy:  Replace phosphorus to WNL  Plan:  K-Phos 61mMols IV today x 1 F/U labs tomorrow am  Hart Robinsons A 02/05/2015,9:25 AM

## 2015-02-05 NOTE — Discharge Summary (Signed)
Physician Discharge Summary  Patient ID: Miranda Aguilar MRN: [id number] DOB/AGE: 1961/03/07 55 y.o.  Admit date: 01/29/2015 Discharge date: 02/05/2015  Admission Diagnoses: Gallstone ileus, hypertension, acute renal failure  Discharge Diagnoses: Same, hypokalemia, hypophosphatemia Principal Problem:   Gallstone ileus Active Problems:   Abdominal pain   Obesity   Hypokalemia   Hyperglycemia   Acute renal failure   Abdominal pain in female   Essential hypertension   Discharged Condition: good  Hospital Course: Patient is a 54 year old white female who was in her usual state of health when she presented emergency room with worsening abdominal distention, nausea, and vomiting. 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 She was also noted to have acute renal failure as well as hypokalemia. She was moved by the hospitalist service for further evaluation treatment. Surgery consult was obtained. Once her renal function had normalized, she subsequently underwent exploratory laparotomy, incidental Meckel's diverticulectomy on 02/01/2015. Her postoperative course was remarkable for mild delay in return of bowel function. She was noted to be hypokalemic as well as hypophosphatemic. Both of these were addressed. She was started on Toprol XL for her hypertension. She is being discharged home on 02/05/2015 in good improving condition.  Treatments: surgery: Exploratory laparotomy, incidental Meckel's diverticulectomy on 02/01/2015  Discharge Exam: Blood pressure 140/70, pulse 68, temperature 98 F (36.7 C), temperature source Oral, resp. rate 21, height 5\' 6"  (1.676 m), weight 121.564 kg (268 lb), last menstrual period 11/22/2014, SpO2 96 %. General appearance: alert, cooperative and no distress Resp: clear to auscultation bilaterally Cardio: regular rate  and rhythm, S1, S2 normal, no murmur, click, rub or gallop GI: Soft, incision healing well. Bowel sounds active. Minimal tenderness noted.  Disposition: 01-Home or Self Care     Medication List    TAKE these medications        HYDROcodone-acetaminophen 5-325 MG per tablet  Commonly known as:  NORCO/VICODIN  Take 1-2 tablets by mouth every 6 (six) hours as needed for moderate pain.     ibuprofen 200 MG tablet  Commonly known as:  ADVIL,MOTRIN  Take 400 mg by mouth every 8 (eight) hours as needed (pain).     metoprolol succinate 50 MG 24 hr tablet  Commonly known as:  TOPROL-XL  Take 1 tablet (50 mg total) by mouth daily. Take with or immediately following a meal.     ondansetron 8 MG tablet  Commonly known as:  ZOFRAN  Take 1 tablet (8 mg total) by mouth every 8 (eight) hours as needed for nausea or vomiting.     Potassium Chloride ER 20 MEQ Tbcr  Take 20 mEq by mouth every morning.           Follow-up Information    Follow up with Jamesetta So, MD. Schedule an appointment as soon as possible for a visit on 02/16/2015.   Specialty:  General Surgery   Contact information:   [id number] [phone]       Signed: Aviva Signs A 02/05/2015, 10:41 AM

## 2015-02-05 NOTE — Discharge Instructions (Signed)
Laparotomy Care After Refer to this sheet in the next few weeks. These instructions provide you with information on caring for yourself after your procedure. Your caregiver may also give you more specific instructions. Your treatment has been planned according to current medical practices, but problems sometimes occur. Call your caregiver if you have any problems or questions after your procedure. HOME CARE INSTRUCTIONS ACTIVITY  Rest as much as possible the first two weeks at home.  Avoid strenuous activity such as heavy lifting (more than 10 pounds), pushing, or pulling. Limit stair climbing to once or twice a day for the first week, then slowly increase this activity.  Take frequent rest periods throughout the day.  Talk with your caregiver about when you may resume your usual physical activity.  You need to be out of bed and walking as much as possible. This decreases the chance of:  Blood clots.  Pneumonia. NUTRITION  You can resume your normal diet once you regain bowel function.  Drink plenty of fluids (6-8 glasses a day or as instructed by your caregiver).  Eat a well-balanced diet.  Daily portions of food from the meat (protein), milk, vegetable, and bread groups are necessary for your health. ELIMINATION It is very important not to strain during bowel movements. If constipation should occur, you may:  Take a mild laxative.  Add fruit and bran to your diet.  Drink more fluids. HYGIENE  Take showers, not baths, until 4-6 weeks after surgery.  If your incision is closed, you may take a shower or tub bath. FEVER If you feel feverish or have shaking chills, take your temperature. If it is 102 F (38.9 C), call your caregiver. The fever may mean there is an infection. PAIN CONTROL  Mild discomfort may occur.  Only take over-the-counter or prescription medicines for pain, discomfort, or fever as directed by your caregiver. Take any prescribed medicines exactly as  directed. INCISION CARE  Keep your incision site clean with soap and water.  Do not use a dressing unless your cut (incision) from surgery is draining or irritated.  If you have small adhesive strips in place and they do not fall off within 10 days, carefully peel them off.  Check your incision and surrounding area daily for any redness, swelling, discoloration, heavy drainage, or separation of the skin. SEXUAL INTERCOURSE Do not have sexual intercourse until after your follow-up appointment, unless your caregiver tells you otherwise. SEEK MEDICAL CARE IF:   You are unable to tolerate food or drinks.  You are unable to pass gas or have a bowel movement.  Your pain becomes more severe or is not relieved with medicines.  You have redness, swelling, discoloration, heavy drainage, or separation of the skin at the incision site. Document Released: 07/09/2004 Document Revised: 11/11/2012 Document Reviewed: 11/24/2007 [facility] Patient Information 2015 [place], [place]. This information is not intended to replace advice given to you by your health care provider. Make sure you discuss any questions you have with your health care provider. Meckel's Diverticulum A Meckel's diverticulum is a leftover part of a duct that attaches to the small intestine. It failed to close normally. A Meckel's diverticulum is present from birth. It is found in about 2 percent of the population. The diverticulum is usually a small pouch 1 to 1.5 inches long. It is located on the small intestine towards the end where it connects to the large intestine (colon). The diverticulum may contain gastric mucosa. This means it may contain cells similar to  cells in the stomach. These cells secrete acid. This acid may cause ulceration of the small bowel resulting in bleeding.  SYMPTOMS   This may be found during surgery for another problem. No problems may have been present.  Bleeding is a common symptom.  The pouch may also  become twisted to form a blockage, or an intussusception. An intussusception is a condition where the intestine telescopes inside itself. Both of these conditions cause abdominal pain. They must be treated with immediate surgery. DIAGNOSIS   When a Meckel's diverticulum is suspected, it may be confirmed with scans. These scans use radioactive tracer that is taken up by the gastric mucosa.  If the origin of bleeding remains unclear, it may be necessary to do arteriography. This test is done by injecting dye into a blood vessel. This test finds the bleeding. If the origin of bleeding is still not found, it may be necessary to do exploratory surgery. TREATMENT  The treatment of Meckel's diverticulum is almost always surgery. The diverticulum is removed, or a portion of the neighboring small intestine is also removed. Less often, if there is acute bleeding it may be possible to perform intra-arterial instillation of medications or material to block the bleeding from the artery. Your caregiver will discuss your options with you. Document Released: 08/20/2001 Document Revised: 02/17/2012 Document Reviewed: 10/28/2007 [facility] Patient Information 2015 [place], [place]. This information is not intended to replace advice given to you by your health care provider. Make sure you discuss any questions you have with your health care provider.

## 2015-02-08 NOTE — Care Management Utilization Note (Signed)
UR completed 

## 2015-03-20 ENCOUNTER — Encounter (INDEPENDENT_AMBULATORY_CARE_PROVIDER_SITE_OTHER): Payer: Self-pay

## 2015-03-20 ENCOUNTER — Encounter: Payer: Self-pay | Admitting: Nurse Practitioner

## 2015-03-20 ENCOUNTER — Ambulatory Visit (INDEPENDENT_AMBULATORY_CARE_PROVIDER_SITE_OTHER): Payer: BLUE CROSS/BLUE SHIELD | Admitting: Nurse Practitioner

## 2015-03-20 VITALS — BP 130/82 | HR 82 | Temp 97.7°F | Resp 14 | Ht 65.0 in | Wt 261.4 lb

## 2015-03-20 DIAGNOSIS — Z7689 Persons encountering health services in other specified circumstances: Secondary | ICD-10-CM

## 2015-03-20 DIAGNOSIS — Z7189 Other specified counseling: Secondary | ICD-10-CM

## 2015-03-20 DIAGNOSIS — Z1211 Encounter for screening for malignant neoplasm of colon: Secondary | ICD-10-CM | POA: Diagnosis not present

## 2015-03-20 DIAGNOSIS — Z23 Encounter for immunization: Secondary | ICD-10-CM

## 2015-03-20 DIAGNOSIS — R109 Unspecified abdominal pain: Secondary | ICD-10-CM

## 2015-03-20 DIAGNOSIS — K563 Gallstone ileus: Secondary | ICD-10-CM

## 2015-03-20 DIAGNOSIS — I10 Essential (primary) hypertension: Secondary | ICD-10-CM | POA: Diagnosis not present

## 2015-03-20 DIAGNOSIS — Z1239 Encounter for other screening for malignant neoplasm of breast: Secondary | ICD-10-CM

## 2015-03-20 DIAGNOSIS — M25569 Pain in unspecified knee: Secondary | ICD-10-CM

## 2015-03-20 MED ORDER — GABAPENTIN 300 MG PO CAPS: 300.0000 mg | ORAL_CAPSULE | Freq: Every day | ORAL | Status: DC

## 2015-03-20 MED ORDER — OMEPRAZOLE 20 MG PO CPDR: 20.0000 mg | DELAYED_RELEASE_CAPSULE | Freq: Every day | ORAL | Status: DC

## 2015-03-20 NOTE — Progress Notes (Signed)
Subjective:    Patient ID: Miranda Aguilar, female    DOB: Aug 11, 1961, 54 y.o.   MRN: [id number]  HPI  Miranda Aguilar is a 54 yo female establishing care and CC of numbness in legs.   1) New pt info:   Immunizations- tdap   Mammogram- Never  Pap- 2005, has all parts, LMP- April 5th and lasted 4 days   Colonoscopy- Never, interested in consult   Eye Exam- 2 years ago, normal   Dental Exam- Every 6 months  2) Chronic Problems-  HTN- BP at home 130's over 80, stable on Metoprolol   3) Acute Problems-  Large gallstone, laparoscopy, dehydrated, 1 week vomiting unable to keep fluid down.   Heart burn- Tums helpful, sharp and sudden then goes on, took Prilosec in past  Nerve pain in legs- 5 weeks, laying flat due to vomiting  Lateral side of both legs, feels like a wound, sharp pain, left leg worse than right.  Review of Systems  Constitutional: Negative for fever, chills, diaphoresis and fatigue.  HENT: Negative for tinnitus and trouble swallowing.   Eyes: Negative for visual disturbance.  Respiratory: Negative for chest tightness, shortness of breath and wheezing.   Cardiovascular: Negative for chest pain, palpitations and leg swelling.  Gastrointestinal: Negative for nausea, vomiting, diarrhea and constipation.  Genitourinary: Negative for difficulty urinating.  Musculoskeletal: Positive for arthralgias. Negative for back pain.       Right shoulder  Skin: Negative for rash.  Neurological: Positive for numbness. Negative for dizziness, weakness and headaches.       Lateral sides of legs only thigh   Hematological: Does not bruise/bleed easily.  Psychiatric/Behavioral: Negative for suicidal ideas and sleep disturbance. The patient is not nervous/anxious.    Past Medical History  Diagnosis Date  . Gallstone ileus   . Hypertension     01/2015  . Obesity   . Chicken pox   . GERD (gastroesophageal reflux disease)     Heartburn    History   Social History  . Marital Status:  Married    Spouse Name: N/A  . Number of Children: N/A  . Years of Education: N/A   Occupational History  . Not on file.   Social History Main Topics  . Smoking status: Never Smoker   . Smokeless tobacco: Never Used  . Alcohol Use: 0.0 oz/week    0 Standard drinks or equivalent per week     Comment: ocassional  . Drug Use: No  . Sexual Activity:    Partners: Male     Comment: Husband    Other Topics Concern  . Not on file   Social History Narrative   Works at [employer] husband    No children   2 dogs, 1 cat, 1 frog All live inside    Right handed    Caffeine- 1 cup of coffee   Enjoys- showing and training dogs        Past Surgical History  Procedure Laterality Date  . Tonsillectomy    . Wisdom tooth extraction    . Laparotomy N/A 02/01/2015    Procedure: EXPLORATORY LAPAROTOMY, MECKEL'S DIVERTICULECTOMY;  Surgeon: Jamesetta So, MD;  Location: [facility];  Service: General;  Laterality: N/A;  . Tonsillectomy    . Cholecystectomy      laparscopy to move gallstone stuck in intestine    Family History  Problem Relation Age of Onset  . Cancer Mother   .  Diabetes Mother   . Hyperlipidemia Mother   . Heart disease Mother   . Cancer Father   . Stroke Father   . Diabetes Sister   . Diabetes Brother     No Known Allergies  Current Outpatient Prescriptions on File Prior to Visit  Medication Sig Dispense Refill  . ibuprofen (ADVIL,MOTRIN) 200 MG tablet Take 400 mg by mouth every 8 (eight) hours as needed (pain).    . metoprolol succinate (TOPROL-XL) 50 MG 24 hr tablet Take 1 tablet (50 mg total) by mouth daily. Take with or immediately following a meal. 30 tablet 1   No current facility-administered medications on file prior to visit.      Objective:   Physical Exam  Constitutional: She is oriented to person, place, and time. She appears well-developed and well-nourished. No distress.  BP 130/82 mmHg  Pulse 82  Temp(Src) 97.7 F  (36.5 C) (Oral)  Resp 14  Ht 5\' 5"  (1.651 m)  Wt 261 lb 6.4 oz (118.57 kg)  BMI 43.50 kg/m2  SpO2 97%  LMP    HENT:  Head: Normocephalic and atraumatic.  Right Ear: External ear normal.  Left Ear: External ear normal.  Cardiovascular: Normal rate, regular rhythm, normal heart sounds and intact distal pulses.  Exam reveals no gallop and no friction rub.   No murmur heard. Pulmonary/Chest: Effort normal and breath sounds normal. No respiratory distress. She has no wheezes. She has no rales. She exhibits no tenderness.  Abdominal:  Obese  Neurological: She is alert and oriented to person, place, and time. No cranial nerve deficit. She exhibits normal muscle tone. Coordination normal.  Skin: Skin is warm and dry. No rash noted. She is not diaphoretic.  Psychiatric: She has a normal mood and affect. Her behavior is normal. Judgment and thought content normal.          Assessment & Plan:

## 2015-03-20 NOTE — Progress Notes (Signed)
Pre visit review using our clinic review tool, if applicable. No additional management support is needed unless otherwise documented below in the visit note. 

## 2015-03-20 NOTE — Patient Instructions (Addendum)
Sent in Prilosec and gabapentin to the pharmacy.   Nice to meet you and welcome to [facility]!   Follow up in 2 months to see how legs are.   We will contact you about your referrals.

## 2015-03-23 ENCOUNTER — Encounter: Payer: Self-pay | Admitting: Nurse Practitioner

## 2015-03-26 DIAGNOSIS — Z0000 Encounter for general adult medical examination without abnormal findings: Secondary | ICD-10-CM | POA: Insufficient documentation

## 2015-03-26 DIAGNOSIS — M25569 Pain in unspecified knee: Secondary | ICD-10-CM | POA: Insufficient documentation

## 2015-03-26 NOTE — Assessment & Plan Note (Signed)
Referral to GI per pt and after surgery was suggested.

## 2015-03-26 NOTE — Assessment & Plan Note (Signed)
Stable on Metoprolol 50 mg daily.

## 2015-03-26 NOTE — Assessment & Plan Note (Signed)
L>R leg.

## 2015-03-26 NOTE — Assessment & Plan Note (Signed)
Stable after surgery.

## 2015-03-26 NOTE — Assessment & Plan Note (Signed)
Discussed acute and chronic issues. Reviewed health maintenance measures, PFSHx, and immunizations.   Will obtain Tdap today  Mammogram referral

## 2015-03-30 ENCOUNTER — Telehealth: Payer: Self-pay

## 2015-03-30 NOTE — Telephone Encounter (Signed)
Patient called today saying she had received a letter from DS to be triaged. Please call her at [phone]

## 2015-04-05 ENCOUNTER — Telehealth: Payer: Self-pay | Admitting: Nurse Practitioner

## 2015-04-05 ENCOUNTER — Other Ambulatory Visit: Payer: Self-pay

## 2015-04-05 MED ORDER — METOPROLOL SUCCINATE ER 50 MG PO TB24: 50.0000 mg | ORAL_TABLET | Freq: Every day | ORAL | Status: DC

## 2015-04-05 NOTE — Telephone Encounter (Signed)
Can fill + 3 refills.

## 2015-04-05 NOTE — Telephone Encounter (Signed)
Patient would like a refill on her METOPROLOL 50mg  prescription. Patient expressed that she has called several times.

## 2015-04-05 NOTE — Telephone Encounter (Signed)
Rx refilled. Will contact pt and make aware.

## 2015-04-06 NOTE — Telephone Encounter (Signed)
LMOM to call and preferably after 1:30 pm today will be the best time to return call.

## 2015-04-11 ENCOUNTER — Telehealth: Payer: Self-pay

## 2015-04-11 NOTE — Telephone Encounter (Signed)
See separate triage.  

## 2015-04-11 NOTE — Telephone Encounter (Signed)
Gastroenterology Pre-Procedure Review  Request Date: 04/11/2015 Requesting Physician: Lorane Gell , NP [facility])  PATIENT REVIEW QUESTIONS: The patient responded to the following health history questions as indicated:    DRINKS A GLASS OF WINE SEVERAL TIMES WEEKLY WITH EVENING MEAL  1. Diabetes Melitis: no 2. Joint replacements in the past 12 months: no 3. Major health problems in the past 3 months: HAD A GALLSTONE ILEUS IN FEB 2016/ SURGERY/ DOING FINE NOW 4. Has an artificial valve or MVP: no 5. Has a defibrillator: no 6. Has been advised in past to take antibiotics in advance of a procedure like teeth cleaning: no    MEDICATIONS & ALLERGIES:    Patient reports the following regarding taking any blood thinners:   Plavix? no Aspirin? no Coumadin? no  Patient confirms/reports the following medications:  Current Outpatient Prescriptions  Medication Sig Dispense Refill  . gabapentin (NEURONTIN) 300 MG capsule Take 1 capsule (300 mg total) by mouth at bedtime. 30 capsule 2  . ibuprofen (ADVIL,MOTRIN) 200 MG tablet Take 400 mg by mouth every 8 (eight) hours as needed (pain).    . metoprolol succinate (TOPROL-XL) 50 MG 24 hr tablet Take 1 tablet (50 mg total) by mouth daily. Take with or immediately following a meal. 30 tablet 3  . omeprazole (PRILOSEC) 20 MG capsule Take 1 capsule (20 mg total) by mouth daily. 30 capsule 3   No current facility-administered medications for this visit.    Patient confirms/reports the following allergies:  No Known Allergies  No orders of the defined types were placed in this encounter.    AUTHORIZATION INFORMATION Primary Insurance:   ID #:  Group #:  Pre-Cert / Auth required:  Pre-Cert / Auth #:   Secondary Insurance:   ID #:   Group #:  Pre-Cert / Auth required: Pre-Cert / Auth #:   SCHEDULE INFORMATION: Procedure has been scheduled as follows:  Date:  05/22/2015              Time:  8:30 am Location: [facility]  Short Stay  This Gastroenterology Pre-Precedure Review Form is being routed to the following provider(s): Barney Drain, MD

## 2015-04-12 ENCOUNTER — Other Ambulatory Visit: Payer: Self-pay

## 2015-04-12 DIAGNOSIS — Z1211 Encounter for screening for malignant neoplasm of colon: Secondary | ICD-10-CM

## 2015-04-12 MED ORDER — PEG-KCL-NACL-NASULF-NA ASC-C 100 G PO SOLR: 1.0000 | ORAL | Status: DC

## 2015-04-12 NOTE — Telephone Encounter (Signed)
MOVI PREP SPLIT DOSING, FULL LIQUIDS WITH BREAKFAST.    Full Liquid Diet A high-calorie, high-protein supplement should be used to meet your nutritional requirements when the full liquid diet is continued for more than 2 or 3 days. If this diet is to be used for an extended period of time (more than 7 days), a multivitamin should be considered.  Breads and Starches  Allowed: None are allowed except crackers in soup.   Avoid: Any others.    Potatoes/Pasta/Rice  Allowed: ANY ITEM AS A SOUP OR SMALL PLATE OF MASHED POTATOES.     Vegetables  Allowed: Strained tomato or vegetable juice. Vegetables pureed in soup.   Avoid: Any others.    Fruit  Allowed: Any strained fruit juices and fruit drinks. Include 1 serving of citrus or vitamin C-enriched fruit juice daily.   Avoid: Any others.  Meat and Meat Substitutes  Allowed: Egg  Avoid: Any meat, fish, or fowl. All cheese.  Milk  Allowed: Milk beverages, including milk shakes and instant breakfast mixes. Smooth yogurt.   Avoid: Any others. Avoid dairy products if not tolerated.    Soups and Combination Foods  Allowed: Broth, strained cream soups. Strained, broth-based soups.   Avoid: Any others.    Desserts and Sweets  Allowed: flavored gelatin,plain ice cream, sherbet, smooth pudding, junket, fruit ices, frozen ice pops, pudding pops,, frozen fudge pops, chocolate syrup. Sugar, honey, jelly, syrup.   Avoid: Any others.  Fats and Oils  Allowed: Margarine, butter, cream, sour cream, oils.   Avoid: Any others.  Beverages  Allowed: All.   Avoid: None.  Condiments  Allowed: Iodized salt, pepper, spices, flavorings. Cocoa powder.   Avoid: Any others.    SAMPLE MEAL PLAN Breakfast   cup orange juice.   1 OR 2 EGGS   1 cup  milk.   1 cup beverage (coffee or tea).   Cream or sugar, if desired.    Midmorning Snack  2 SCRAMBLED OR HARD BOILED EGG   Lunch  1 cup cream soup.    cup fruit  juice.   1 cup milk.    cup custard.   1 cup beverage (coffee or tea).   Cream or sugar, if desired.    Midafternoon Snack  1 cup milk shake.  Dinner  1 cup cream soup.    cup fruit juice.   1 cup milk.    cup pudding.   1 cup beverage (coffee or tea).   Cream or sugar, if desired.  Evening Snack  1 cup supplement.  To increase calories, add sugar, cream, butter, or margarine if possible. Nutritional supplements will also increase the total calories.

## 2015-04-12 NOTE — Telephone Encounter (Signed)
Rx sent to the pharmacy and instructions mailed to pt.  

## 2015-05-03 ENCOUNTER — Ambulatory Visit
Admission: RE | Admit: 2015-05-03 | Discharge: 2015-05-03 | Disposition: A | Discharge status: Home / Self Care | Payer: BLUE CROSS/BLUE SHIELD | Source: Ambulatory Visit | Attending: Nurse Practitioner | Admitting: Nurse Practitioner

## 2015-05-03 DIAGNOSIS — Z1231 Encounter for screening mammogram for malignant neoplasm of breast: Secondary | ICD-10-CM | POA: Insufficient documentation

## 2015-05-03 DIAGNOSIS — Z1239 Encounter for other screening for malignant neoplasm of breast: Secondary | ICD-10-CM

## 2015-05-04 ENCOUNTER — Other Ambulatory Visit: Payer: Self-pay | Admitting: Nurse Practitioner

## 2015-05-04 DIAGNOSIS — R928 Other abnormal and inconclusive findings on diagnostic imaging of breast: Secondary | ICD-10-CM

## 2015-05-05 ENCOUNTER — Ambulatory Visit: Payer: BLUE CROSS/BLUE SHIELD

## 2015-05-15 ENCOUNTER — Ambulatory Visit: Payer: BLUE CROSS/BLUE SHIELD | Admitting: Nurse Practitioner

## 2015-05-16 ENCOUNTER — Telehealth: Payer: Self-pay

## 2015-05-16 NOTE — Telephone Encounter (Signed)
I called and LMOM for a return call. Need to know if pt has had any change in her meds since she was triaged.

## 2015-05-17 ENCOUNTER — Ambulatory Visit
Admission: RE | Admit: 2015-05-17 | Discharge: 2015-05-17 | Disposition: A | Discharge status: Home / Self Care | Payer: BLUE CROSS/BLUE SHIELD | Source: Ambulatory Visit | Attending: Nurse Practitioner | Admitting: Nurse Practitioner

## 2015-05-17 ENCOUNTER — Other Ambulatory Visit: Payer: Self-pay | Admitting: Nurse Practitioner

## 2015-05-17 DIAGNOSIS — R928 Other abnormal and inconclusive findings on diagnostic imaging of breast: Secondary | ICD-10-CM

## 2015-05-17 DIAGNOSIS — R922 Inconclusive mammogram: Secondary | ICD-10-CM | POA: Insufficient documentation

## 2015-05-17 NOTE — Telephone Encounter (Signed)
PT left VM that there has been no change in medications.

## 2015-05-17 NOTE — Telephone Encounter (Signed)
REVIEWED-NO ADDITIONAL RECOMMENDATIONS. 

## 2015-05-18 ENCOUNTER — Encounter: Payer: Self-pay | Admitting: Nurse Practitioner

## 2015-05-18 ENCOUNTER — Ambulatory Visit (INDEPENDENT_AMBULATORY_CARE_PROVIDER_SITE_OTHER): Payer: BLUE CROSS/BLUE SHIELD | Admitting: Nurse Practitioner

## 2015-05-18 ENCOUNTER — Telehealth: Payer: Self-pay

## 2015-05-18 VITALS — BP 118/60 | HR 60 | Temp 97.4°F | Resp 14 | Ht 65.0 in | Wt 266.0 lb

## 2015-05-18 DIAGNOSIS — K219 Gastro-esophageal reflux disease without esophagitis: Secondary | ICD-10-CM | POA: Diagnosis not present

## 2015-05-18 DIAGNOSIS — M25569 Pain in unspecified knee: Secondary | ICD-10-CM | POA: Diagnosis not present

## 2015-05-18 MED ORDER — GABAPENTIN 100 MG PO CAPS: 100.0000 mg | ORAL_CAPSULE | Freq: Three times a day (TID) | ORAL | Status: DC

## 2015-05-18 MED ORDER — VITAMIN D (ERGOCALCIFEROL) 1.25 MG (50000 UNIT) PO CAPS: 50000.0000 [IU] | ORAL_CAPSULE | ORAL | Status: DC

## 2015-05-18 NOTE — Assessment & Plan Note (Signed)
Wants to wean off Gabapentin. Called in 100 mg for 7 days then to stop. Pt reports it has a paradoxical effect and she has a hard time falling asleep while on this.

## 2015-05-18 NOTE — Patient Instructions (Addendum)
Hop off of the gabapentin after taking the 100 mg once daily for 7 days.   Stop the Prilosec and watch diet (triggers included acidic foods and fatty foods) Use Zantac as needed.   Take the Vitamin D once a week (pick a day) and take for 3 months. We will re-evaluate your levels then.   If you still are having hair thinning after these changes we can look at your thyroid levels.

## 2015-05-18 NOTE — Telephone Encounter (Signed)
I called [employer] @ [phone] and spoke to [place] who said that a PA is not required for screening colonoscopy.

## 2015-05-18 NOTE — Progress Notes (Signed)
   Subjective:    Patient ID: Miranda Aguilar, female    DOB: 12/01/1961, 54 y.o.   MRN: [id number]  HPI  Miranda Aguilar is a 54 yo female here for a 2 month follow up of GERD and nerve pain.   1) Gabapentin- stimulant more than drowsy, not drowsy.   2) Prilosec wants to d/c due to recent information   3) Last month noticed thinning hair    03/30/15  Glucose 92, uric acid 4.0  Phosphorus high, LDH high,  A1c 5.6  Vitamin d 22.7   Review of Systems  Constitutional: Negative for fever, chills, diaphoresis and fatigue.  Respiratory: Negative for chest tightness, shortness of breath and wheezing.   Cardiovascular: Negative for chest pain, palpitations and leg swelling.  Gastrointestinal: Negative for nausea, vomiting and diarrhea.  Skin: Negative for rash.  Neurological: Negative for dizziness, weakness, numbness and headaches.  Psychiatric/Behavioral: The patient is not nervous/anxious.        Objective:   Physical Exam  Constitutional: She is oriented to person, place, and time. She appears well-developed and well-nourished. No distress.  BP 118/60 mmHg  Pulse 60  Temp(Src) 97.4 F (36.3 C) (Oral)  Resp 14  Ht 5\' 5"  (1.651 m)  Wt 266 lb (120.657 kg)  BMI 44.26 kg/m2  SpO2 99%  LMP 03/24/2015   HENT:  Head: Normocephalic and atraumatic.  Right Ear: External ear normal.  Left Ear: External ear normal.  Cardiovascular: Normal rate, regular rhythm and normal heart sounds.   Pulmonary/Chest: Effort normal and breath sounds normal. No respiratory distress. She has no wheezes. She has no rales. She exhibits no tenderness.  Neurological: She is alert and oriented to person, place, and time. No cranial nerve deficit. She exhibits normal muscle tone. Coordination normal.  Skin: Skin is warm and dry. No rash noted. She is not diaphoretic.  Psychiatric: She has a normal mood and affect. Her behavior is normal. Judgment and thought content normal.      Assessment & Plan:

## 2015-05-18 NOTE — Progress Notes (Signed)
Pre visit review using our clinic review tool, if applicable. No additional management support is needed unless otherwise documented below in the visit note. 

## 2015-05-22 ENCOUNTER — Ambulatory Visit (HOSPITAL_COMMUNITY)
Admission: RE | Admit: 2015-05-22 | Discharge: 2015-05-22 | Disposition: A | Discharge status: Home / Self Care | Payer: BLUE CROSS/BLUE SHIELD | Source: Ambulatory Visit | Attending: Gastroenterology | Admitting: Gastroenterology

## 2015-05-22 ENCOUNTER — Encounter (HOSPITAL_COMMUNITY): Payer: Self-pay | Admitting: *Deleted

## 2015-05-22 ENCOUNTER — Encounter (HOSPITAL_COMMUNITY)
Admission: RE | Disposition: A | Discharge status: Home / Self Care | Payer: Self-pay | Source: Ambulatory Visit | Attending: Gastroenterology

## 2015-05-22 DIAGNOSIS — K219 Gastro-esophageal reflux disease without esophagitis: Secondary | ICD-10-CM | POA: Diagnosis not present

## 2015-05-22 DIAGNOSIS — K5730 Diverticulosis of large intestine without perforation or abscess without bleeding: Secondary | ICD-10-CM | POA: Insufficient documentation

## 2015-05-22 DIAGNOSIS — M6289 Other specified disorders of muscle: Secondary | ICD-10-CM | POA: Insufficient documentation

## 2015-05-22 DIAGNOSIS — K648 Other hemorrhoids: Secondary | ICD-10-CM | POA: Diagnosis not present

## 2015-05-22 DIAGNOSIS — Z791 Long term (current) use of non-steroidal anti-inflammatories (NSAID): Secondary | ICD-10-CM | POA: Insufficient documentation

## 2015-05-22 DIAGNOSIS — I10 Essential (primary) hypertension: Secondary | ICD-10-CM | POA: Insufficient documentation

## 2015-05-22 DIAGNOSIS — Z6841 Body Mass Index (BMI) 40.0 and over, adult: Secondary | ICD-10-CM | POA: Insufficient documentation

## 2015-05-22 DIAGNOSIS — E669 Obesity, unspecified: Secondary | ICD-10-CM | POA: Insufficient documentation

## 2015-05-22 DIAGNOSIS — K6389 Other specified diseases of intestine: Secondary | ICD-10-CM | POA: Insufficient documentation

## 2015-05-22 DIAGNOSIS — K644 Residual hemorrhoidal skin tags: Secondary | ICD-10-CM | POA: Insufficient documentation

## 2015-05-22 DIAGNOSIS — Z79899 Other long term (current) drug therapy: Secondary | ICD-10-CM | POA: Diagnosis not present

## 2015-05-22 DIAGNOSIS — Z1211 Encounter for screening for malignant neoplasm of colon: Secondary | ICD-10-CM | POA: Insufficient documentation

## 2015-05-22 HISTORY — PX: COLONOSCOPY: SHX5424

## 2015-05-22 HISTORY — DX: Constipation, unspecified: K59.00

## 2015-05-22 SURGERY — COLONOSCOPY
Anesthesia: Moderate Sedation

## 2015-05-22 MED ORDER — MEPERIDINE HCL 100 MG/ML IJ SOLN
INTRAMUSCULAR | Status: AC
  Filled 2015-05-22: qty 2

## 2015-05-22 MED ORDER — ATROPINE SULFATE 1 MG/ML IJ SOLN
INTRAMUSCULAR | Status: DC | PRN
  Administered 2015-05-22: 1 mg via INTRAVENOUS

## 2015-05-22 MED ORDER — SODIUM CHLORIDE 0.9 % IV SOLN
INTRAVENOUS | Status: DC
  Administered 2015-05-22: 08:00:00 via INTRAVENOUS

## 2015-05-22 MED ORDER — MIDAZOLAM HCL 5 MG/5ML IJ SOLN
INTRAMUSCULAR | Status: DC | PRN
  Administered 2015-05-22 (×2): 2 mg via INTRAVENOUS
  Administered 2015-05-22: 1 mg via INTRAVENOUS

## 2015-05-22 MED ORDER — MIDAZOLAM HCL 5 MG/5ML IJ SOLN
INTRAMUSCULAR | Status: AC
  Filled 2015-05-22: qty 10

## 2015-05-22 MED ORDER — MEPERIDINE HCL 100 MG/ML IJ SOLN
INTRAMUSCULAR | Status: DC | PRN
  Administered 2015-05-22 (×3): 25 mg via INTRAVENOUS

## 2015-05-22 NOTE — Discharge Instructions (Signed)
You have small internal hemorrhoids and diverticulosis IN YOUR LEFT COLON. YOU HAVE A REDUNDANT LEFT COLON.   CONTINUE YOUR WEIGHT LOSS EFFORTS.  Follow a HIGH FIBER DIET. AVOID ITEMS THAT CAUSE BLOATING & GAS. See info below.  Next colonoscopy in 10 years WITH AN OVERTUBE.   Colonoscopy Care After Read the instructions outlined below and refer to this sheet in the next week. These discharge instructions provide you with general information on caring for yourself after you leave the hospital. While your treatment has been planned according to the most current medical practices available, unavoidable complications occasionally occur. If you have any problems or questions after discharge, call DR. Nivia Gervase, [phone].  ACTIVITY  You may resume your regular activity, but move at a slower pace for the next 24 hours.   Take frequent rest periods for the next 24 hours.   Walking will help get rid of the air and reduce the bloated feeling in your belly (abdomen).   No driving for 24 hours (because of the medicine (anesthesia) used during the test).   You may shower.   Do not sign any important legal documents or operate any machinery for 24 hours (because of the anesthesia used during the test).    NUTRITION  Drink plenty of fluids.   You may resume your normal diet as instructed by your doctor.   Begin with a light meal and progress to your normal diet. Heavy or fried foods are harder to digest and may make you feel sick to your stomach (nauseated).   Avoid alcoholic beverages for 24 hours or as instructed.    MEDICATIONS  You may resume your normal medications.   WHAT YOU CAN EXPECT TODAY  Some feelings of bloating in the abdomen.   Passage of more gas than usual.   Spotting of blood in your stool or on the toilet paper  .  IF YOU HAD POLYPS REMOVED DURING THE COLONOSCOPY:  Eat a soft diet IF YOU HAVE NAUSEA, BLOATING, ABDOMINAL PAIN, OR VOMITING.    FINDING OUT  THE RESULTS OF YOUR TEST Not all test results are available during your visit. DR. Oneida Alar WILL CALL YOU WITHIN 7 DAYS OF YOUR PROCEDUE WITH YOUR RESULTS. Do not assume everything is normal if you have not heard from DR. Regis Hinton IN ONE WEEK, CALL HER OFFICE AT [phone].  SEEK IMMEDIATE MEDICAL ATTENTION AND CALL THE OFFICE: [phone] IF:  You have more than a spotting of blood in your stool.   Your belly is swollen (abdominal distention).   You are nauseated or vomiting.   You have a temperature over 101F.   You have abdominal pain or discomfort that is severe or gets worse throughout the day.  High-Fiber Diet A high-fiber diet changes your normal diet to include more whole grains, legumes, fruits, and vegetables. Changes in the diet involve replacing refined carbohydrates with unrefined foods. The calorie level of the diet is essentially unchanged. The Dietary Reference Intake (recommended amount) for adult males is 38 grams per day. For adult females, it is 25 grams per day. Pregnant and lactating women should consume 28 grams of fiber per day. Fiber is the intact part of a plant that is not broken down during digestion. Functional fiber is fiber that has been isolated from the plant to provide a beneficial effect in the body.  PURPOSE  Increase stool bulk.   Ease and regulate bowel movements.   Lower cholesterol.   REDUCE RISK FOR COLON CANCER  INDICATIONS  THAT YOU NEED MORE FIBER  Constipation and hemorrhoids.   Uncomplicated diverticulosis (intestine condition) and irritable bowel syndrome.   Weight management.   As a protective measure against hardening of the arteries (atherosclerosis), diabetes, and cancer.   GUIDELINES FOR INCREASING FIBER IN THE DIET  Start adding fiber to the diet slowly. A gradual increase of about 5 more grams (2 slices of whole-wheat bread, 2 servings of most fruits or vegetables, or 1 bowl of high-fiber cereal) per day is best. Too rapid  an increase in fiber may result in constipation, flatulence, and bloating.   Drink enough water and fluids to keep your urine clear or pale yellow. Water, juice, or caffeine-free drinks are recommended. Not drinking enough fluid may cause constipation.   Eat a variety of high-fiber foods rather than one type of fiber.   Try to increase your intake of fiber through using high-fiber foods rather than fiber pills or supplements that contain small amounts of fiber.   The goal is to change the types of food eaten. Do not supplement your present diet with high-fiber foods, but replace foods in your present diet.    INCLUDE A VARIETY OF FIBER SOURCES  Replace refined and processed grains with whole grains, canned fruits with fresh fruits, and incorporate other fiber sources. White rice, white breads, and most bakery goods contain little or no fiber.   Brown whole-grain rice, buckwheat oats, and many fruits and vegetables are all good sources of fiber. These include: broccoli, Brussels sprouts, cabbage, cauliflower, beets, sweet potatoes, white potatoes (skin on), carrots, tomatoes, eggplant, squash, berries, fresh fruits, and dried fruits.   Cereals appear to be the richest source of fiber. Cereal fiber is found in whole grains and bran. Bran is the fiber-rich outer coat of cereal grain, which is largely removed in refining. In whole-grain cereals, the bran remains. In breakfast cereals, the largest amount of fiber is found in those with "bran" in their names. The fiber content is sometimes indicated on the label.   You may need to include additional fruits and vegetables each day.   In baking, for 1 cup white flour, you may use the following substitutions:   1 cup whole-wheat flour minus 2 tablespoons.   1/2 cup white flour plus 1/2 cup whole-wheat flour.   Diverticulosis Diverticulosis is a common condition that develops when small pouches (diverticula) form in the wall of the colon. The risk  of diverticulosis increases with age. It happens more often in people who eat a low-fiber diet. Most individuals with diverticulosis have no symptoms. Those individuals with symptoms usually experience belly (abdominal) pain, constipation, or loose stools (diarrhea).  HOME CARE INSTRUCTIONS  Increase the amount of fiber in your diet as directed by your caregiver or dietician. This may reduce symptoms of diverticulosis.   Drink at least 6 to 8 glasses of water each day to prevent constipation.   Try not to strain when you have a bowel movement.   Avoiding nuts and seeds to prevent complications is NOT NECESSARY.    FOODS HAVING HIGH FIBER CONTENT INCLUDE:  Fruits. Apple, peach, pear, tangerine, raisins, prunes.   Vegetables. Brussels sprouts, asparagus, broccoli, cabbage, carrot, cauliflower, romaine lettuce, spinach, summer squash, tomato, winter squash, zucchini.   Starchy Vegetables. Baked beans, kidney beans, lima beans, split peas, lentils, potatoes (with skin).   Grains. Whole wheat bread, brown rice, bran flake cereal, plain oatmeal, white rice, shredded wheat, bran muffins.   SEEK IMMEDIATE MEDICAL CARE IF:  You  develop increasing pain or severe bloating.   You have an oral temperature above 101F.   You develop vomiting or bowel movements that are bloody or black.   Hemorrhoids Hemorrhoids are dilated (enlarged) veins around the rectum. Sometimes clots will form in the veins. This makes them swollen and painful. These are called thrombosed hemorrhoids. Causes of hemorrhoids include:  Constipation.   Straining to have a bowel movement.   HEAVY LIFTING  HOME CARE INSTRUCTIONS  Eat a well balanced diet and drink 6 to 8 glasses of water every day to avoid constipation. You may also use a bulk laxative.   Avoid straining to have bowel movements.   Keep anal area dry and clean.   Do not use a donut shaped pillow or sit on the toilet for long periods. This increases  blood pooling and pain.   Move your bowels when your body has the urge; this will require less straining and will decrease pain and pressure.

## 2015-05-22 NOTE — H&P (Signed)
Primary Care Physician:  Rubbie Battiest, NP Primary Gastroenterologist:  Dr. Oneida Alar  Pre-Procedure History & Physical: HPI:  Miranda Aguilar is a 54 y.o. female here for [place].   Past Medical History  Diagnosis Date  . Gallstone ileus   . Hypertension     01/2015  . Obesity   . Chicken pox   . GERD (gastroesophageal reflux disease)     Heartburn  . Constipation     Occasionally    Past Surgical History  Procedure Laterality Date  . Tonsillectomy    . Wisdom tooth extraction    . Laparotomy N/A 02/01/2015    Procedure: EXPLORATORY LAPAROTOMY, MECKEL'S DIVERTICULECTOMY;  Surgeon: Jamesetta So, MD;  Location: [facility];  Service: General;  Laterality: N/A;  . Tonsillectomy      Prior to Admission medications   Medication Sig Start Date End Date Taking? Authorizing Provider  gabapentin (NEURONTIN) 100 MG capsule Take 1 capsule (100 mg total) by mouth 3 (three) times daily. 05/18/15  Yes Rubbie Battiest, NP  ibuprofen (ADVIL,MOTRIN) 200 MG tablet Take 400 mg by mouth every 8 (eight) hours as needed (pain).   Yes Historical Provider, MD  metoprolol succinate (TOPROL-XL) 50 MG 24 hr tablet Take 1 tablet (50 mg total) by mouth daily. Take with or immediately following a meal. 04/05/15  Yes Rubbie Battiest, NP  omeprazole (PRILOSEC) 20 MG capsule Take 20 mg by mouth daily.   Yes Historical Provider, MD  peg 3350 powder (MOVIPREP) 100 G SOLR Take 1 kit (200 g total) by mouth as directed. 04/12/15  Yes Danie Binder, MD  Vitamin D, Ergocalciferol, (DRISDOL) 50000 UNITS CAPS capsule Take 1 capsule (50,000 Units total) by mouth every 7 (seven) days. 05/18/15  Yes Rubbie Battiest, NP    Allergies as of 04/12/2015  . (No Known Allergies)    Family History  Problem Relation Age of Onset  . Cancer Mother   . Diabetes Mother   . Hyperlipidemia Mother   . Heart disease Mother   . Cancer Father   . Stroke Father   . Diabetes Sister   . Diabetes Brother     History   Social  History  . Marital Status: Married    Spouse Name: N/A  . Number of Children: N/A  . Years of Education: N/A   Occupational History  . Not on file.   Social History Main Topics  . Smoking status: Never Smoker   . Smokeless tobacco: Never Used  . Alcohol Use: 3.0 oz/week    0 Standard drinks or equivalent, 5 Glasses of wine per week  . Drug Use: No  . Sexual Activity:    Partners: Male     Comment: Husband    Other Topics Concern  . Not on file   Social History Narrative   Works at [employer] husband    No children   2 dogs, 1 cat, 1 frog All live inside    Right handed    Caffeine- 1 cup of coffee   Enjoys- showing and training dogs        Review of Systems: See HPI, otherwise negative ROS   Physical Exam: BP 149/89 mmHg  Pulse 68  Temp(Src) 97.4 F (36.3 C) (Oral)  Resp 18  Ht 5' 5.5" (1.664 m)  Wt 255 lb (115.667 kg)  BMI 41.77 kg/m2  SpO2 96%  LMP 05/16/2015 General:   Alert,  pleasant and cooperative in NAD Head:  Normocephalic and atraumatic. Neck:  Supple; Lungs:  Clear throughout to auscultation.    Heart:  Regular rate and rhythm. Abdomen:  Soft, nontender and nondistended. Normal bowel sounds, without guarding, and without rebound.   Neurologic:  Alert and  oriented x4;  grossly normal neurologically.  Impression/Plan:     SCREENING  Plan:  1. TCS TODAY

## 2015-05-22 NOTE — Op Note (Addendum)
[facility] [address] [place], [address]   COLONOSCOPY PROCEDURE REPORT  PATIENT: Miranda Aguilar, Miranda Aguilar  MR#: [id number] BIRTHDATE: 1961/01/06 , 64  yrs. old GENDER: female ENDOSCOPIST: Danie Binder, MD REFERRED PY:PPJKDT DOSS, NP-c PROCEDURE DATE:  2015-05-23 PROCEDURE:   Colonoscopy, screening INDICATIONS:average risk patient for colon cancer. MEDICATIONS: Atropine 0.5 mg IV, Demerol 75 mg IV, and Versed 5 mg IV  DESCRIPTION OF PROCEDURE:    Physical exam was performed.  Informed consent was obtained from the patient after explaining the benefits, risks, and alternatives to procedure.  The patient was connected to monitor and placed in left lateral position. Continuous oxygen was provided by nasal cannula and IV medicine administered through an indwelling cannula.  After administration of sedation and rectal exam, the patients rectum was intubated and the EC-[id number] ([id number])  colonoscope was advanced under direct visualization to the cecum.  The scope was removed slowly by carefully examining the color, texture, anatomy, and integrity mucosa on the way out.  The patient was recovered in endoscopy and discharged home in satisfactory condition. Estimated blood loss is zero unless otherwise noted in this procedure report.     COLON FINDINGS: There was moderate diverticulosis noted in the sigmoid colon, descending colon, and distal transverse colon with associated muscular hypertrophy and tortuosity.  , The colon was redundant.  Manual abdominal counter-pressure was used to reach the cecum.  The patient was moved on to their back to reach the cecum, Small internal hemorrhoids were found.  , and Moderate sized external hemorrhoids were found.  PREP QUALITY: excellent.  CECAL W/D TIME: 10       minutes COMPLICATIONS: HR DROPPED TO 45 AFTER DEMEROL 50/VERSED 4, ATROPINE 0.5 MG IV GIVEN HR REMAINED > 60.  ENDOSCOPIC IMPRESSION: 1.   Moderate diverticulosis in  the sigmoid colon, descending colon, and distal transverse colon 2.   The LEFT colon IS EXTREMELY redundant 3.   Small internal hemorrhoids 4.   Moderate sized external hemorrhoids  RECOMMENDATIONS: CONTINUE WEIGHT LOSS EFFORT. HIGH FIBER DIET NEXT COLONOSCOPY IN 10 YEARS WITH AN OVERTUBE    _______________________________ eSignedDanie Binder, MD May 23, 2015 9:49 AM Revised: 05/23/15 9:49 AM   CPT CODES: ICD CODES:  The ICD and CPT codes recommended by this software are interpretations from the data that the clinical staff has captured with the software.  The verification of the translation of this report to the ICD and CPT codes and modifiers is the sole responsibility of the health care institution and practicing physician where this report was generated.  [employer]. will not be held responsible for the validity of the ICD and CPT codes included on this report.  AMA assumes no liability for data contained or not contained herein. C[employer] is a Designer, television/film set of the [employer].

## 2015-05-23 ENCOUNTER — Encounter (HOSPITAL_COMMUNITY): Payer: Self-pay | Admitting: Gastroenterology

## 2015-05-28 DIAGNOSIS — K219 Gastro-esophageal reflux disease without esophagitis: Secondary | ICD-10-CM | POA: Insufficient documentation

## 2015-05-28 NOTE — Assessment & Plan Note (Signed)
Prilosec d/c, watch trigger foods, and use Zantac OTC as needed.

## 2015-06-27 ENCOUNTER — Other Ambulatory Visit: Payer: Self-pay | Admitting: Nurse Practitioner

## 2015-08-01 ENCOUNTER — Other Ambulatory Visit: Payer: Self-pay | Admitting: Nurse Practitioner

## 2015-08-07 ENCOUNTER — Other Ambulatory Visit: Payer: Self-pay | Admitting: Nurse Practitioner

## 2015-08-07 NOTE — Telephone Encounter (Signed)
Please advise on refill. Looks like you discontinued omeprazole on last visit

## 2015-08-18 ENCOUNTER — Ambulatory Visit: Payer: BLUE CROSS/BLUE SHIELD | Admitting: Nurse Practitioner

## 2015-08-21 ENCOUNTER — Encounter: Payer: Self-pay | Admitting: Nurse Practitioner

## 2015-08-21 ENCOUNTER — Ambulatory Visit (INDEPENDENT_AMBULATORY_CARE_PROVIDER_SITE_OTHER): Payer: BLUE CROSS/BLUE SHIELD | Admitting: Nurse Practitioner

## 2015-08-21 VITALS — BP 128/96 | HR 72 | Temp 98.4°F | Resp 16 | Ht 65.0 in | Wt 274.0 lb

## 2015-08-21 DIAGNOSIS — M25569 Pain in unspecified knee: Secondary | ICD-10-CM | POA: Diagnosis not present

## 2015-08-21 DIAGNOSIS — K219 Gastro-esophageal reflux disease without esophagitis: Secondary | ICD-10-CM

## 2015-08-21 DIAGNOSIS — E669 Obesity, unspecified: Secondary | ICD-10-CM

## 2015-08-21 DIAGNOSIS — E559 Vitamin D deficiency, unspecified: Secondary | ICD-10-CM | POA: Insufficient documentation

## 2015-08-21 LAB — VITAMIN D 25 HYDROXY (VIT D DEFICIENCY, FRACTURES): VITD: 25.14 ng/mL — ABNORMAL LOW (ref 30.00–100.00)

## 2015-08-21 NOTE — Progress Notes (Signed)
Patient ID: Miranda Aguilar, female    DOB: 1961/08/14  Age: 54 y.o. MRN: [id number]  CC: Follow-up   HPI Miranda Aguilar presents for Follow up from stopping Gabapentin, switching from Prilosec to Zantac, hair thinning, and vitamin D deficiency.   1) Repeat Vitamin D levels  Taking once weekly- Mondays   Vitamin D level last done in 4/16 22.7   2) Switched from Prilosec to zantac. Thinks GERD is coming from stress due to new semester at [employer], raw veggies might be a trigger, Zantac not helpful, Prilosec- started back and helpful at this time.    3) Stopped Gabapentin due to feeling energetic and not sleeping well. Pt had surgery in February for nerve pain, right leg improving, left leg not improving. Went back on 300 mg   4) Hair Loss- slowed down, no problems with this currently   5) Fell down 6 weeks ago and bruised her left knee posteriorly, leg feels unsteady and has mild pain with extension   Heard a pop when falling, sharp pain when falling, bruising behind the knee   No trouble walking, ran at the dog show running and felt instability   History Miranda Aguilar has a past medical history of Gallstone ileus; Hypertension; Obesity; Chicken pox; GERD (gastroesophageal reflux disease); and Constipation.   She has past surgical history that includes Tonsillectomy; Wisdom tooth extraction; laparotomy (N/A, 02/01/2015); Tonsillectomy; and Colonoscopy (N/A, 05/22/2015).   Her family history includes Cancer in her father and mother; Diabetes in her brother, mother, and sister; Heart disease in her mother; Hyperlipidemia in her mother; Stroke in her father.She reports that she has never smoked. She has never used smokeless tobacco. She reports that she drinks about 3.0 oz of alcohol per week. She reports that she does not use illicit drugs.  Outpatient Prescriptions Prior to Visit  Medication Sig Dispense Refill  . ibuprofen (ADVIL,MOTRIN) 200 MG tablet Take 400 mg by mouth every 8 (eight) hours as  needed (pain).    . metoprolol succinate (TOPROL-XL) 50 MG 24 hr tablet take 1 tablet by mouth once daily TAKE WITH OR IMMEDIATELY FOLLOWING A MEAL 30 tablet 3  . omeprazole (PRILOSEC) 20 MG capsule take 1 capsule by mouth once daily 30 capsule 3  . Vitamin D, Ergocalciferol, (DRISDOL) 50000 UNITS CAPS capsule take 1 capsule by mouth every week 4 capsule 2  . gabapentin (NEURONTIN) 100 MG capsule Take 1 capsule (100 mg total) by mouth 3 (three) times daily. 7 capsule 0  . gabapentin (NEURONTIN) 300 MG capsule take 1 capsule by mouth at bedtime 30 capsule 2  . omeprazole (PRILOSEC) 20 MG capsule Take 20 mg by mouth daily.     No facility-administered medications prior to visit.    ROS Review of Systems  Constitutional: Negative for fever, chills, diaphoresis and fatigue.  Respiratory: Negative for chest tightness, shortness of breath and wheezing.   Cardiovascular: Negative for chest pain, palpitations and leg swelling.  Gastrointestinal: Negative for nausea, vomiting and diarrhea.  Skin: Negative for rash.  Neurological: Negative for dizziness, weakness, numbness and headaches.  Psychiatric/Behavioral: The patient is not nervous/anxious.     Objective:  BP 128/96 mmHg  Pulse 72  Temp(Src) 98.4 F (36.9 C)  Resp 16  Ht 5\' 5"  (1.651 m)  Wt 274 lb (124.286 kg)  BMI 45.60 kg/m2  SpO2 96%  Physical Exam  Constitutional: She is oriented to person, place, and time. She appears well-developed and well-nourished. No distress.  HENT:  Head: Normocephalic and atraumatic.  Right Ear: External ear normal.  Left Ear: External ear normal.  Cardiovascular: Normal rate, regular rhythm and normal heart sounds.  Exam reveals no gallop and no friction rub.   No murmur heard. Pulmonary/Chest: Effort normal and breath sounds normal. No respiratory distress. She has no wheezes. She has no rales. She exhibits no tenderness.  Musculoskeletal: Normal range of motion. She exhibits no edema or  tenderness.  Left knee is negative for grind test, anterior and posterior drawer tests, no tenderness to palpation of left knee anterior posterior  Neurological: She is alert and oriented to person, place, and time. No cranial nerve deficit. She exhibits normal muscle tone. Coordination normal.  Skin: Skin is warm and dry. No rash noted. She is not diaphoretic.  Psychiatric: She has a normal mood and affect. Her behavior is normal. Judgment and thought content normal.   Assessment & Plan:   Miranda Aguilar was seen today for follow-up.  Diagnoses and all orders for this visit:  Pain in joint, lower leg, unspecified laterality  Vitamin D deficiency -     Vitamin D (25 hydroxy)  Gastroesophageal reflux disease without esophagitis  Obesity  Other orders -     Cancel: TSH -     Cancel: T4, free -     Cancel: Basic Metabolic Panel (BMET) -     Cancel: CBC with Differential/Platelet   I am having Miranda Aguilar maintain her ibuprofen, metoprolol succinate, omeprazole, Vitamin D (Ergocalciferol), and gabapentin.  Meds ordered this encounter  Medications  . gabapentin (NEURONTIN) 300 MG capsule    Sig: Take 300 mg by mouth at bedtime.     Follow-up: Return in about 3 months (around 11/20/2015) for GERD, stress, left knee pain.

## 2015-08-21 NOTE — Assessment & Plan Note (Signed)
Patient has vitamin D deficiency as of April 2016. Last lab was 22.7. Patient has been on vitamin D 50,000 units once weekly. Will obtain new vitamin D level today. Follow-up after results

## 2015-08-21 NOTE — Patient Instructions (Signed)
Watch for food triggers and stress. Call me if reflux worsens.   Start low and go slow with stretches and jogging/running. Treadmill first is a good idea. Ice, rest, and ibuprofen as needed   Follow up in 3 months.

## 2015-08-21 NOTE — Assessment & Plan Note (Signed)
Patient is currently stable with leg pain. She is currently on gabapentin 300 mg daily. Patient had a fall 6 weeks ago. Patient heard a pop, had sharp pain, and subsequent bruising posteriorly. Patient is now partially functional with mild pain still intermittent. Patient will continue to work on hamstring stretches, ice, ibuprofen as needed. Asked patient to let me know if forcing her failure to improve

## 2015-08-21 NOTE — Assessment & Plan Note (Signed)
Patient went back from Zantac to Prilosec. Currently stable. Will continue. Follow-up as needed

## 2015-08-21 NOTE — Progress Notes (Signed)
Pre visit review using our clinic review tool, if applicable. No additional management support is needed unless otherwise documented below in the visit note. 

## 2015-08-21 NOTE — Assessment & Plan Note (Signed)
Patient is currently not working on diet and exercise but this semester has just started you on and she is very stressed out. We'll follow-up in 3 months.

## 2015-09-29 ENCOUNTER — Other Ambulatory Visit: Payer: Self-pay | Admitting: Nurse Practitioner

## 2015-09-29 NOTE — Telephone Encounter (Signed)
Please advise on refill of gabapentin. Historical provider

## 2015-10-25 ENCOUNTER — Other Ambulatory Visit: Payer: Self-pay | Admitting: Nurse Practitioner

## 2015-10-26 ENCOUNTER — Other Ambulatory Visit: Payer: Self-pay

## 2015-10-26 NOTE — Telephone Encounter (Signed)
Last OV 9.12.16, next OV 12.12.16. Do you still want her to take such a high dose of Vitamin D. Please advise refill

## 2015-11-20 ENCOUNTER — Ambulatory Visit: Payer: BLUE CROSS/BLUE SHIELD | Admitting: Nurse Practitioner

## 2015-11-27 ENCOUNTER — Other Ambulatory Visit: Payer: Self-pay | Admitting: Nurse Practitioner

## 2015-12-12 ENCOUNTER — Ambulatory Visit (INDEPENDENT_AMBULATORY_CARE_PROVIDER_SITE_OTHER): Payer: BLUE CROSS/BLUE SHIELD | Admitting: Nurse Practitioner

## 2015-12-12 VITALS — BP 122/98 | HR 84 | Temp 97.8°F | Ht 66.0 in | Wt 286.0 lb

## 2015-12-12 DIAGNOSIS — N6011 Diffuse cystic mastopathy of right breast: Secondary | ICD-10-CM

## 2015-12-12 DIAGNOSIS — E876 Hypokalemia: Secondary | ICD-10-CM

## 2015-12-12 DIAGNOSIS — E559 Vitamin D deficiency, unspecified: Secondary | ICD-10-CM

## 2015-12-12 DIAGNOSIS — I10 Essential (primary) hypertension: Secondary | ICD-10-CM | POA: Diagnosis not present

## 2015-12-12 DIAGNOSIS — R739 Hyperglycemia, unspecified: Secondary | ICD-10-CM

## 2015-12-12 LAB — COMPREHENSIVE METABOLIC PANEL
ALBUMIN: 4.3 g/dL (ref 3.5–5.2)
ALK PHOS: 65 U/L (ref 39–117)
ALT: 19 U/L (ref 0–35)
AST: 18 U/L (ref 0–37)
BUN: 23 mg/dL (ref 6–23)
CO2: 27 mEq/L (ref 19–32)
CREATININE: 0.67 mg/dL (ref 0.40–1.20)
Calcium: 9.8 mg/dL (ref 8.4–10.5)
Chloride: 102 mEq/L (ref 96–112)
GFR: 97.26 mL/min (ref >60.00)
Glucose, Bld: 99 mg/dL (ref 70–99)
Potassium: 4.3 mEq/L (ref 3.5–5.1)
Sodium: 137 mEq/L (ref 135–145)
TOTAL PROTEIN: 7.5 g/dL (ref 6.0–8.3)
Total Bilirubin: 0.4 mg/dL (ref 0.2–1.2)

## 2015-12-12 LAB — CBC WITH DIFFERENTIAL/PLATELET
Basophils Absolute: 0 10*3/uL (ref 0.0–0.1)
Basophils Relative: 0.6 % (ref 0.0–3.0)
EOS ABS: 0.2 10*3/uL (ref 0.0–0.7)
Eosinophils Relative: 3.7 % (ref 0.0–5.0)
HCT: 40.1 % (ref 36.0–46.0)
HEMOGLOBIN: 13.4 g/dL (ref 12.0–15.0)
Lymphocytes Relative: 29.6 % (ref 12.0–46.0)
Lymphs Abs: 1.4 10*3/uL (ref 0.7–4.0)
MCHC: 33.5 g/dL (ref 30.0–36.0)
MCV: 89.4 fl (ref 78.0–100.0)
MONO ABS: 0.3 10*3/uL (ref 0.1–1.0)
Monocytes Relative: 7 % (ref 3.0–12.0)
Neutro Abs: 2.8 10*3/uL (ref 1.4–7.7)
Neutrophils Relative %: 59.1 % (ref 43.0–77.0)
Platelets: 248 10*3/uL (ref 150.0–400.0)
RBC: 4.49 Mil/uL (ref 3.87–5.11)
RDW: 13.1 % (ref 11.5–15.5)
WBC: 4.7 10*3/uL (ref 4.0–10.5)

## 2015-12-12 LAB — TSH: TSH: 1.36 u[IU]/mL (ref 0.35–4.50)

## 2015-12-12 LAB — LIPID PANEL
CHOLESTEROL: 206 mg/dL — AB (ref 0–200)
HDL: 57.7 mg/dL (ref >39.00)
LDL Cholesterol: 131 mg/dL — ABNORMAL HIGH (ref 0–99)
NonHDL: 148.18
TRIGLYCERIDES: 84 mg/dL (ref 0.0–149.0)
Total CHOL/HDL Ratio: 4
VLDL: 16.8 mg/dL (ref 0.0–40.0)

## 2015-12-12 LAB — HEMOGLOBIN A1C: Hgb A1c MFr Bld: 5.8 % (ref 4.6–6.5)

## 2015-12-12 LAB — VITAMIN D 25 HYDROXY (VIT D DEFICIENCY, FRACTURES): VITD: 33.58 ng/mL (ref 30.00–100.00)

## 2015-12-12 MED ORDER — LIRAGLUTIDE -WEIGHT MANAGEMENT 18 MG/3ML ~~LOC~~ SOPN: 0.6000 mg | PEN_INJECTOR | Freq: Every day | SUBCUTANEOUS | Status: DC

## 2015-12-12 MED ORDER — PEN NEEDLES 31G X 5 MM MISC: 1.0000 "application " | Freq: Every day | Status: DC

## 2015-12-12 NOTE — Patient Instructions (Signed)
Miranda Aguilar- go to [email or web address] for savings card and more information.  Work on starting lifestyle changes with healthy diet and exercise   Once we get this approved we will do a demonstration for your first injection in the office.

## 2015-12-12 NOTE — Progress Notes (Signed)
Patient ID: Miranda Aguilar, female    DOB: [birth year]  Age: 55 y.o. MRN: [id number]  CC: Follow-up   HPI Miranda Aguilar presents for follow up of mammogram and obesity.   1) Mammogram-  05/17/15 with [place] right breast showing cysts and follow up in 6 months Right dx mm and R [place]       2) Obesity- No formal diet/exercise H/o trying Weight watchers and other diet programs with some success until stopping.   31 lbs gained in 6 Months Mother is sick and this is hard for Miranda Aguilar to focus on herself  No h/o thyroid disease or fam h/o thyroid disease, pancreatitis, nor depression/suicidal ideations  Fasting for lab work today   History Miranda Aguilar has a past medical history of Gallstone ileus; Hypertension; Obesity; Chicken pox; GERD (gastroesophageal reflux disease); and Constipation.   Miranda Aguilar has past surgical history that includes Tonsillectomy; Wisdom tooth extraction; laparotomy (N/A, 02/01/2015); Tonsillectomy; and Colonoscopy (N/A, 05/22/2015).   Miranda Aguilar family history includes Cancer in Miranda Aguilar father and mother; Diabetes in Miranda Aguilar brother, mother, and sister; Heart disease in Miranda Aguilar mother; Hyperlipidemia in Miranda Aguilar mother; Stroke in Miranda Aguilar father.Miranda Aguilar reports that Miranda Aguilar has never smoked. Miranda Aguilar has never used smokeless tobacco. Miranda Aguilar reports that Miranda Aguilar drinks about 3.0 oz of alcohol per week. Miranda Aguilar reports that Miranda Aguilar does not use illicit drugs.  Outpatient Prescriptions Prior to Visit  Medication Sig Dispense Refill  . gabapentin (NEURONTIN) 300 MG capsule Take 300 mg by mouth at bedtime.    Marland Kitchen ibuprofen (ADVIL,MOTRIN) 200 MG tablet Take 400 mg by mouth every 8 (eight) hours as needed (pain).    . metoprolol succinate (TOPROL-XL) 50 MG 24 hr tablet take 1 tablet by mouth once daily WITH OR IMMEDIATELY FOLLOWING A MEAL 30 tablet 3  . omeprazole (PRILOSEC) 20 MG capsule take 1 capsule by mouth once daily 30 capsule 3  . Vitamin D, Ergocalciferol, (DRISDOL) 50000 UNITS CAPS capsule take 1 capsule by mouth every week 4 capsule 2  .  gabapentin (NEURONTIN) 300 MG capsule take 1 capsule by mouth at bedtime (Patient not taking: Reported on 12/12/2015) 30 capsule 2   No facility-administered medications prior to visit.    ROS Review of Systems  Constitutional: Negative for fever, chills, diaphoresis and fatigue.  Respiratory: Negative for chest tightness, shortness of breath and wheezing.   Cardiovascular: Negative for chest pain, palpitations and leg swelling.  Gastrointestinal: Negative for nausea, vomiting and diarrhea.  Endocrine: Positive for polyphagia. Negative for cold intolerance, heat intolerance, polydipsia and polyuria.  Skin: Negative for rash.  Neurological: Negative for dizziness, weakness, numbness and headaches.  Psychiatric/Behavioral: The patient is not nervous/anxious.     Objective:  BP 122/98 mmHg  Pulse 84  Temp(Src) 97.8 F (36.6 C)  Ht _0  (1.676 m)  Wt 286 lb (129.729 kg)  BMI 46.18 kg/m2  SpO2 98%  LMP 07/26/2015  Physical Exam  Constitutional: Miranda Aguilar is oriented to person, place, and time. Miranda Aguilar appears well-developed and well-nourished. No distress.  HENT:  Head: Normocephalic and atraumatic.  Right Ear: External ear normal.  Left Ear: External ear normal.  Mouth/Throat: No oropharyngeal exudate.  Eyes: EOM are normal. Pupils are equal, round, and reactive to light. Right eye exhibits no discharge. Left eye exhibits no discharge. No scleral icterus.  Glasses  Neck: Normal range of motion. Neck supple. No thyromegaly present.  Cardiovascular: Normal rate, regular rhythm, normal heart sounds and intact distal pulses.  Exam reveals no gallop and no friction rub.  No murmur heard. Pulmonary/Chest: Effort normal and breath sounds normal. No respiratory distress. Miranda Aguilar has no wheezes. Miranda Aguilar has no rales. Miranda Aguilar exhibits no tenderness.  Abdominal:  Obesity  Neurological: Miranda Aguilar is alert and oriented to person, place, and time. No cranial nerve deficit. Miranda Aguilar exhibits normal muscle tone. Coordination  normal.  Skin: Skin is warm and dry. No rash noted. Miranda Aguilar is not diaphoretic.  Psychiatric: Miranda Aguilar has a normal mood and affect. Miranda Aguilar behavior is normal. Judgment and thought content normal.   Assessment & Plan:   Miranda Aguilar was seen today for follow-up.  Diagnoses and all orders for this visit:  Cystic breast, right -     MM Digital Diagnostic Unilat R; Future -     US BREAST COMPLETE UNI RIGHT INC AXILLA; Future  Morbid obesity, unspecified obesity type ([name]) -     Comp Met (CMET) -     TSH  Vitamin D deficiency -     Vitamin D (25 hydroxy)  Hyperglycemia -     HgB A1c -     Lipid Profile -     Comp Met (CMET)  Essential hypertension -     CBC with Differential/Platelet -     Lipid Profile -     Comp Met (CMET)  Hypokalemia  Other orders -     Liraglutide -Weight Management (SAXENDA) 18 MG/3ML SOPN; Inject 0.6 mg into the skin daily. -     Insulin Pen Needle (PEN NEEDLES) 31G X 5 MM MISC; 1 application by Does not apply route daily.   I am having Miranda Aguilar start on Liraglutide -Weight Management and Pen Needles. I am also having Miranda Aguilar maintain Miranda Aguilar ibuprofen, gabapentin, Vitamin D (Ergocalciferol), metoprolol succinate, and omeprazole.  Meds ordered this encounter  Medications  . Liraglutide -Weight Management (SAXENDA) 18 MG/3ML SOPN    Sig: Inject 0.6 mg into the skin daily.    Dispense:  3 pen    Refill:  2    Order Specific Question:  Supervising Provider    Answer:  Deborra Medina L [[id number]]  . Insulin Pen Needle (PEN NEEDLES) 31G X 5 MM MISC    Sig: 1 application by Does not apply route daily.    Dispense:  30 each    Refill:  3    Order Specific Question:  Supervising Provider    Answer:  Crecencio Mc [[id number]]     Follow-up: Return if symptoms worsen or fail to improve.

## 2015-12-15 ENCOUNTER — Telehealth: Payer: Self-pay | Admitting: Nurse Practitioner

## 2015-12-15 NOTE — Telephone Encounter (Signed)
Pt called and she needs insurance approval from Lorane Gell for her Liraglutide -Weight Management (SAXENDA) 18 MG/3ML SOPN. Waiting at pharmacy for insurance approval. Call pt at work number.

## 2015-12-15 NOTE — Telephone Encounter (Signed)
I spoke with the pharmacy, the drug has been approved through her insurance with a copay of $210.  And no PA was advised.   Called patient and reviewed this information.  She states that with her insurance it should only be $140 for a 3 month supply.  I explained that she really needs to call the pharmacy or her insurance to clarify.  We would assist as needed.

## 2015-12-17 ENCOUNTER — Encounter: Payer: Self-pay | Admitting: Nurse Practitioner

## 2015-12-17 NOTE — Assessment & Plan Note (Signed)
Taking Vitamin D- need to check a level today

## 2015-12-17 NOTE — Assessment & Plan Note (Signed)
Checking A1c today.

## 2015-12-17 NOTE — Assessment & Plan Note (Signed)
BP Readings from Last 3 Encounters:  12/12/15 122/98  08/21/15 128/96  05/22/15 114/73   Stable- FU at next visit, checking CMET On metoprolol XL

## 2015-12-17 NOTE — Assessment & Plan Note (Signed)
Checking potassium.

## 2015-12-17 NOTE — Assessment & Plan Note (Addendum)
Wt Readings from Last 3 Encounters:  12/12/15 286 lb (129.729 kg)  08/21/15 274 lb (124.286 kg)  05/22/15 255 lb (115.667 kg)   We have discussed that she has started back on [employer] and is motivated this time around. She would like to explore using Saxenda as an option. Will obtain A1c to look at her diabetic status, thyroid panel, and CMET. Sent to pharmacy so we can get PA rolling  [facility] and portion control in past without success  Declines using phentermine due to cardiac toxicity possibility  Contrave is expensive

## 2015-12-18 ENCOUNTER — Encounter: Payer: Self-pay | Admitting: *Deleted

## 2015-12-26 ENCOUNTER — Ambulatory Visit (INDEPENDENT_AMBULATORY_CARE_PROVIDER_SITE_OTHER): Payer: BLUE CROSS/BLUE SHIELD

## 2015-12-26 DIAGNOSIS — E669 Obesity, unspecified: Secondary | ICD-10-CM | POA: Diagnosis not present

## 2015-12-26 NOTE — Progress Notes (Signed)
Patient here today to learn how to use the Saxenda Pen. She was informed to turn the dial on the pen to the 2 dots and over the sink let out the air until liquid comes out. Then turn the dial to 0.6 and inject the medication in the abdomen.

## 2015-12-27 NOTE — Progress Notes (Signed)
I have read and agree with Elberta Leatherwood, CMA's note. No further follow up.   Lorane Gell, NP-C 12/27/15 1026

## 2015-12-31 ENCOUNTER — Other Ambulatory Visit: Payer: Self-pay | Admitting: Nurse Practitioner

## 2016-01-01 NOTE — Telephone Encounter (Signed)
Please advise refill, historical medication, thanks

## 2016-01-03 ENCOUNTER — Ambulatory Visit
Admission: RE | Admit: 2016-01-03 | Discharge: 2016-01-03 | Disposition: A | Discharge status: Home / Self Care | Payer: BLUE CROSS/BLUE SHIELD | Source: Ambulatory Visit | Attending: Nurse Practitioner | Admitting: Nurse Practitioner

## 2016-01-03 ENCOUNTER — Other Ambulatory Visit: Payer: Self-pay | Admitting: Nurse Practitioner

## 2016-01-03 DIAGNOSIS — N6011 Diffuse cystic mastopathy of right breast: Secondary | ICD-10-CM

## 2016-01-03 DIAGNOSIS — N6001 Solitary cyst of right breast: Secondary | ICD-10-CM | POA: Insufficient documentation

## 2016-01-11 ENCOUNTER — Other Ambulatory Visit: Payer: Self-pay | Admitting: Nurse Practitioner

## 2016-01-11 DIAGNOSIS — N6009 Solitary cyst of unspecified breast: Secondary | ICD-10-CM

## 2016-01-15 ENCOUNTER — Other Ambulatory Visit: Payer: Self-pay | Admitting: Nurse Practitioner

## 2016-01-15 DIAGNOSIS — Z1239 Encounter for other screening for malignant neoplasm of breast: Secondary | ICD-10-CM

## 2016-01-31 ENCOUNTER — Other Ambulatory Visit: Payer: Self-pay | Admitting: Nurse Practitioner

## 2016-02-07 ENCOUNTER — Other Ambulatory Visit: Payer: Self-pay | Admitting: Nurse Practitioner

## 2016-02-07 ENCOUNTER — Telehealth: Payer: Self-pay | Admitting: Nurse Practitioner

## 2016-02-07 MED ORDER — LIRAGLUTIDE -WEIGHT MANAGEMENT 18 MG/3ML ~~LOC~~ SOPN: 3.0000 mg | PEN_INJECTOR | Freq: Every day | SUBCUTANEOUS | Status: DC

## 2016-02-07 MED ORDER — PEN NEEDLES 31G X 5 MM MISC: 1.0000 "application " | Freq: Every day | Status: DC

## 2016-02-07 NOTE — Telephone Encounter (Signed)
Pt is having a problem with [facility] in [place]. They need a prescription written for the higher dose of Liraglutide -Weight Management (SAXENDA) 18 MG/3ML SOPN.

## 2016-02-07 NOTE — Telephone Encounter (Signed)
Patient has been on the drug since around 1/17, does she need a dose increase? thanks

## 2016-02-07 NOTE — Telephone Encounter (Signed)
Fixed and sent to pharmacy.

## 2016-02-07 NOTE — Telephone Encounter (Signed)
I didn't get a note with this. It is blank

## 2016-04-02 ENCOUNTER — Other Ambulatory Visit: Payer: Self-pay | Admitting: Internal Medicine

## 2016-04-09 ENCOUNTER — Other Ambulatory Visit: Payer: Self-pay | Admitting: Internal Medicine

## 2016-05-07 ENCOUNTER — Other Ambulatory Visit: Payer: Self-pay | Admitting: *Deleted

## 2016-05-07 ENCOUNTER — Telehealth: Payer: Self-pay | Admitting: *Deleted

## 2016-05-07 MED ORDER — OMEPRAZOLE 20 MG PO CPDR: 20.0000 mg | DELAYED_RELEASE_CAPSULE | Freq: Every day | ORAL | Status: DC

## 2016-05-07 MED ORDER — LIRAGLUTIDE -WEIGHT MANAGEMENT 18 MG/3ML ~~LOC~~ SOPN: 3.0000 mg | PEN_INJECTOR | Freq: Every day | SUBCUTANEOUS | Status: DC

## 2016-05-07 NOTE — Telephone Encounter (Signed)
Let patient know that she needs to follow up with the NP Joycelyn Schmid in July

## 2016-05-07 NOTE — Telephone Encounter (Signed)
Refilled the Saxenda ,  but needs to be seen for 6 month follow up with Joycelyn Schmid, our NP

## 2016-05-07 NOTE — Telephone Encounter (Signed)
Patient has requested a medication refill for Saxenda and omeprazole  Pharmacy [facility] in [place]

## 2016-05-07 NOTE — Telephone Encounter (Signed)
Sent in script for Omeprazole and left message for patient to call us back. Need to tell her to schedule a follow up appointment with the NP Joycelyn Schmid per Dr. Derrel Nip.

## 2016-05-17 ENCOUNTER — Ambulatory Visit
Admission: RE | Admit: 2016-05-17 | Discharge: 2016-05-17 | Disposition: A | Discharge status: Home / Self Care | Payer: BLUE CROSS/BLUE SHIELD | Source: Ambulatory Visit | Attending: Nurse Practitioner | Admitting: Nurse Practitioner

## 2016-05-17 ENCOUNTER — Other Ambulatory Visit: Payer: Self-pay | Admitting: Nurse Practitioner

## 2016-05-17 DIAGNOSIS — Z1239 Encounter for other screening for malignant neoplasm of breast: Secondary | ICD-10-CM

## 2016-05-17 DIAGNOSIS — N6489 Other specified disorders of breast: Secondary | ICD-10-CM | POA: Diagnosis not present

## 2016-05-17 DIAGNOSIS — N63 Unspecified lump in breast: Secondary | ICD-10-CM | POA: Insufficient documentation

## 2016-05-17 DIAGNOSIS — N6009 Solitary cyst of unspecified breast: Secondary | ICD-10-CM

## 2016-05-17 DIAGNOSIS — R928 Other abnormal and inconclusive findings on diagnostic imaging of breast: Secondary | ICD-10-CM | POA: Diagnosis not present

## 2016-05-17 DIAGNOSIS — N6001 Solitary cyst of right breast: Secondary | ICD-10-CM | POA: Insufficient documentation

## 2016-06-18 ENCOUNTER — Encounter: Payer: Self-pay | Admitting: Family

## 2016-06-18 ENCOUNTER — Ambulatory Visit (INDEPENDENT_AMBULATORY_CARE_PROVIDER_SITE_OTHER): Payer: BLUE CROSS/BLUE SHIELD | Admitting: Family

## 2016-06-18 VITALS — BP 114/84 | HR 64 | Temp 98.3°F | Ht 66.0 in | Wt 257.8 lb

## 2016-06-18 DIAGNOSIS — K429 Umbilical hernia without obstruction or gangrene: Secondary | ICD-10-CM

## 2016-06-18 NOTE — Patient Instructions (Addendum)
Referral to general surgery for consult regarding hernia.  Schedule well woman exam for PAP.  If there is no improvement in your symptoms, or if there is any worsening of symptoms, or if you have any additional concerns, please return for re-evaluation; or, if we are closed, consider going to the Emergency Room for evaluation if symptoms urgent.

## 2016-06-18 NOTE — Progress Notes (Signed)
Subjective:    Patient ID: Miranda Aguilar, female    DOB: 09-Oct-1961, 55 y.o.   MRN: [id number]   Miranda Aguilar is a 55 y.o. female who presents today for an acute visit.    HPI Comments: Patient here for evaluation of umbilical hernia for one year. No abdominal pain. Last year had emergency surgery due to gallstone ileus. No bowel resection. At that time, surgeon fixed umbilical hernia. As she has lost weight, hernia is more noticeable. Patient's PCP has left this clinic. Routine lab work done in January of this year.  Patient overdue to PAP. Colonoscopy and mammogram UTD. Has flu shots at [facility].     Past Medical History  Diagnosis Date  . Gallstone ileus ([place])   . Hypertension     01/2015  . Obesity   . Chicken pox   . GERD (gastroesophageal reflux disease)     Heartburn  . Constipation     Occasionally   Allergies: Review of patient's allergies indicates no known allergies. Current Outpatient Prescriptions on File Prior to Visit  Medication Sig Dispense Refill  . ibuprofen (ADVIL,MOTRIN) 200 MG tablet Take 400 mg by mouth every 8 (eight) hours as needed (pain).    . Insulin Pen Needle (PEN NEEDLES) 31G X 5 MM MISC 1 application by Does not apply route daily. 30 each 3  . Liraglutide -Weight Management (SAXENDA) 18 MG/3ML SOPN Inject 3 mg into the skin daily. 3 pen 2  . metoprolol succinate (TOPROL-XL) 50 MG 24 hr tablet take 1 tablet by mouth once daily FOLLOWING A MEAL 30 tablet 3  . omeprazole (PRILOSEC) 20 MG capsule Take 1 capsule (20 mg total) by mouth daily. 30 capsule 1   No current facility-administered medications on file prior to visit.    Social History  Substance Use Topics  . Smoking status: Never Smoker   . Smokeless tobacco: Never Used  . Alcohol Use: 3.0 oz/week    0 Standard drinks or equivalent, 5 Glasses of wine per week    Review of Systems  Constitutional: Negative for fever and chills.  Respiratory: Negative for cough.   Cardiovascular:  Negative for chest pain and palpitations.  Gastrointestinal: Negative for nausea, vomiting, abdominal pain, constipation and abdominal distention.      Objective:    BP 114/84 mmHg  Pulse 64  Temp(Src) 98.3 F (36.8 C) (Oral)  Ht 5\' 6"  (1.676 m)  Wt 257 lb 12.8 oz (116.937 kg)  BMI 41.63 kg/m2  SpO2 97%   Physical Exam  Constitutional: She appears well-developed and well-nourished.  Eyes: Conjunctivae are normal.  Cardiovascular: Normal rate, regular rhythm, normal heart sounds and normal pulses.   Pulmonary/Chest: Effort normal and breath sounds normal. She has no wheezes. She has no rhonchi. She has no rales.  Abdominal: Soft. Normal appearance and bowel sounds are normal. She exhibits no distension, no fluid wave, no ascites and no mass. There is no tenderness. There is no rigidity, no guarding, no tenderness at McBurney's point and negative Murphy's sign.  Easily reducible umbilical hernia. Unable to appreciated bowel or gangrene.  Neurological: She is alert.  Skin: Skin is warm and dry.  Psychiatric: She has a normal mood and affect. Her speech is normal and behavior is normal. Thought content normal.  Vitals reviewed.      Assessment & Plan:   1. Umbilical hernia without obstruction and without gangrene Not incarcerated. Consult to surgery due to patient preference.  - Ambulatory referral to General Surgery  Patient will make follow up well woman exam for PAP smear and to officially establish care.    I have discontinued Ms. Enslin's gabapentin, Vitamin D (Ergocalciferol), and gabapentin. I am also having her maintain her ibuprofen, Pen Needles, metoprolol succinate, Liraglutide -Weight Management, omeprazole, and cholecalciferol.   Meds ordered this encounter  Medications  . cholecalciferol (VITAMIN D) 1000 units tablet    Sig: Take 1,000 Units by mouth daily.    Return precautions given.   Start medications as prescribed and explained to patient on After  Visit Summary ( AVS). Risks, benefits, and alternatives of the medications and treatment plan prescribed today were discussed, and patient expressed understanding.   Education regarding symptom management and diagnosis given to patient.   Follow-up:Plan follow-up and return precautions given if any worsening symptoms or change in condition.   Continue to follow with Mable Paris, FNP for routine health maintenance.   Miranda Aguilar and I agreed with plan.   Mable Paris, FNP

## 2016-06-20 ENCOUNTER — Encounter: Payer: Self-pay | Admitting: General Surgery

## 2016-06-27 ENCOUNTER — Encounter: Payer: Self-pay | Admitting: General Surgery

## 2016-06-27 ENCOUNTER — Ambulatory Visit (INDEPENDENT_AMBULATORY_CARE_PROVIDER_SITE_OTHER): Payer: BLUE CROSS/BLUE SHIELD | Admitting: General Surgery

## 2016-06-27 VITALS — BP 128/72 | HR 83 | Resp 12 | Ht 66.0 in | Wt 259.0 lb

## 2016-06-27 DIAGNOSIS — K429 Umbilical hernia without obstruction or gangrene: Secondary | ICD-10-CM | POA: Diagnosis not present

## 2016-06-27 NOTE — Patient Instructions (Signed)
Umbilical Herniorrhaphy Herniorrhaphy is surgery to repair a hernia. A hernia is the protrusion of a part of an organ through an abdominal opening. An umbilical hernia means that your hernia is in the area around your navel. If the hernia is not repaired, the gap could get bigger. Your intestines or other tissues, such as fat, could get trapped in the gap. This can lead to other health problems, such as blocked intestines. If the hernia is fixed before problems set in, you may be allowed to go home the same day as the surgery (outpatient). LET [facility] CARE PROVIDER KNOW ABOUT:  Allergies to food or medicine.  Medicines taken, including vitamins, herbs, eye drops, over-the-counter medicines, and creams.  Use of steroids (by mouth or creams).  Previous problems with anesthetics or numbing medicines.  History of bleeding problems or blood clots.  Previous surgery.  Other health problems, including diabetes and kidney problems.  Possibility of pregnancy, if this applies. RISKS AND COMPLICATIONS  Pain.  Excessive bleeding.  Hematoma. This is a pocket of blood that collects under the surgery site.  Infection at the surgery site.  Numbness at the surgery site.  Swelling and bruising.  Blood clots.  Intestinal damage (rare).  Scarring.  Skin damage.  Development of another hernia. This may require another surgery. BEFORE THE PROCEDURE  Ask your health care provider about changing or stopping your regular medicines. You may need to stop taking aspirin, nonsteroidal anti-inflammatory drugs (NSAIDs), vitamin E, and blood thinners as early as 2 weeks before the procedure.  Do not eat or drink for 8 hours before the procedure, or as directed by your health care provider.  You might be asked to shower or wash with an antibacterial soap before the procedure.  Wear comfortable clothes that will be easy to put on after the procedure. PROCEDURE You will be given an intravenous  (IV) tube. A needle will be inserted in your arm. Medicine will flow directly into your body through this needle. You might be given medicine to help you relax (sedative). You will be given medicine that numbs the area (local anesthetic) or medicine that makes you sleep (general anesthetic). If you have open surgery:  The surgeon will make a cut (incision) in your abdomen.  The gap in the muscle wall will be repaired. The surgeon may sew the edges together over the gap or use a mesh material to strengthen the area. When mesh is used, the body grows new, strong tissue into and around it. This new tissue closes the gap.  A drain might be put in to remove excess fluid from the body after surgery.  The surgeon will close the incision with stitches, glue, or staples. If you have laparoscopic surgery:  The surgeon will make several small incisions in your abdomen.  A thin, lighted tube (laparoscope) will be inserted into the abdomen through an incision. A camera is attached to the laparoscope that allows the surgeon to see inside the abdomen.  Tools will be inserted through the other incisions to repair the hernia. Usually, mesh is used to cover the gap.  The surgeon will close the incisions with stitches. AFTER THE PROCEDURE  You will be taken to a recovery area. A nurse will watch and check your progress.  When you are awake, feeling well, and taking fluids well, you may be allowed to go home. In some cases, you may need to stay overnight in the hospital.  Arrange for someone to drive you home.  This information is not intended to replace advice given to you by your health care provider. Make sure you discuss any questions you have with your health care provider.   Document Released: 02/21/2009 Document Revised: 12/16/2014 Document Reviewed: 02/26/2012 Elsevier Interactive Patient Education [employer].

## 2016-06-27 NOTE — Progress Notes (Signed)
Patient ID: WENDELLA WITZIG, female   DOB: 1961/08/27, 55 y.o.   MRN: [id number]  Chief Complaint  Patient presents with  . Other    umbilical hernia    HPI Miranda Aguilar is a 55 y.o. female is here today for an evaluation for an umbilical hernia. She states it has been present for about 9 months. She states it is always protruding out, but it is not painful and does not bother her. She is here today based on a recommendation from her PCP. She had a laparotomy done in 01/2015 for gallstone ileus, Meckel's diverticulum and repair of a small umbilical hernia (no mesh for repair). I have reviewed the history of present illness with the patient.   Marland KitchenHPI  Past Medical History  Diagnosis Date  . Gallstone ileus ([place])   . Hypertension     01/2015  . Obesity   . Chicken pox   . GERD (gastroesophageal reflux disease)     Heartburn  . Constipation     Occasionally    Past Surgical History  Procedure Laterality Date  . Wisdom tooth extraction    . Laparotomy N/A 02/01/2015    Procedure: EXPLORATORY LAPAROTOMY, MECKEL'S DIVERTICULECTOMY;  Surgeon: Jamesetta So, MD;  Location: [facility] ORS;  Service: General;  Laterality: N/A;  . Tonsillectomy    . Colonoscopy N/A 05/22/2015    Procedure: COLONOSCOPY;  Surgeon: Danie Binder, MD;  Location: [facility] ENDO SUITE;  Service: Endoscopy;  Laterality: N/A;  8:30 AM    Family History  Problem Relation Age of Onset  . Cancer Mother   . Diabetes Mother   . Hyperlipidemia Mother   . Heart disease Mother   . Cancer Father   . Stroke Father   . Diabetes Sister   . Diabetes Brother   . Breast cancer Paternal Aunt 93    Social History Social History  Substance Use Topics  . Smoking status: Never Smoker   . Smokeless tobacco: Never Used  . Alcohol Use: 3.0 oz/week    0 Standard drinks or equivalent, 5 Glasses of wine per week    No Known Allergies  Current Outpatient Prescriptions  Medication Sig Dispense Refill  . cholecalciferol (VITAMIN  D) 1000 units tablet Take 1,000 Units by mouth daily.    Marland Kitchen ibuprofen (ADVIL,MOTRIN) 200 MG tablet Take 400 mg by mouth every 8 (eight) hours as needed (pain).    . Liraglutide -Weight Management (SAXENDA) 18 MG/3ML SOPN Inject 3 mg into the skin daily. 3 pen 2  . metoprolol succinate (TOPROL-XL) 50 MG 24 hr tablet take 1 tablet by mouth once daily FOLLOWING A MEAL 30 tablet 3  . omeprazole (PRILOSEC) 20 MG capsule Take 1 capsule (20 mg total) by mouth daily. 30 capsule 1   No current facility-administered medications for this visit.    Review of Systems Review of Systems  Constitutional: Negative.   Respiratory: Negative.   Cardiovascular: Negative.     Blood pressure 128/72, pulse 83, resp. rate 12, height 5\' 6"  (1.676 m), weight 259 lb (117.482 kg), last menstrual period 02/07/2016.  Physical Exam Physical Exam  Constitutional: She is oriented to person, place, and time. She appears well-developed and well-nourished.  Eyes: Conjunctivae are normal. No scleral icterus.  Neck: Neck supple. No thyromegaly present.  Cardiovascular: Normal rate, regular rhythm and normal heart sounds.   Pulmonary/Chest: Effort normal and breath sounds normal.  Abdominal: Soft. Bowel sounds are normal. A hernia (umbilical) is present.    Lymphadenopathy:  She has no cervical adenopathy.  Neurological: She is alert and oriented to person, place, and time.  Skin: Skin is warm and dry.    Data Reviewed Notes and prior operative reports  Assessment    Umbilical hernia     Plan    Hernia precautions and incarceration were discussed with the patient. If they develop symptoms of an incarcerated hernia, they were encouraged to seek prompt medical attention. I have recommended repair of the hernia using mesh on an outpatient basis in the near future. The risk of infection was reviewed. The role of prosthetic mesh to minimize the risk of recurrence was reviewed.  Patient is agreeable to plan as  discussed. 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      PCP: Jennell Corner, NP   Christene Lye 06/27/2016, 11:34 AM

## 2016-07-20 ENCOUNTER — Other Ambulatory Visit: Payer: Self-pay | Admitting: Internal Medicine

## 2016-07-23 ENCOUNTER — Other Ambulatory Visit: Payer: Self-pay | Admitting: Internal Medicine

## 2016-07-30 ENCOUNTER — Other Ambulatory Visit: Payer: Self-pay | Admitting: Internal Medicine

## 2016-08-20 ENCOUNTER — Encounter: Payer: BLUE CROSS/BLUE SHIELD | Admitting: Family

## 2016-09-02 ENCOUNTER — Encounter: Payer: Self-pay | Admitting: Family

## 2016-09-02 ENCOUNTER — Other Ambulatory Visit (HOSPITAL_COMMUNITY)
Admission: RE | Admit: 2016-09-02 | Discharge: 2016-09-02 | Disposition: A | Discharge status: Home / Self Care | Payer: BLUE CROSS/BLUE SHIELD | Source: Ambulatory Visit | Attending: Family | Admitting: Family

## 2016-09-02 ENCOUNTER — Ambulatory Visit (INDEPENDENT_AMBULATORY_CARE_PROVIDER_SITE_OTHER): Payer: BLUE CROSS/BLUE SHIELD | Admitting: Family

## 2016-09-02 VITALS — BP 140/80 | HR 68 | Temp 97.6°F | Ht 66.0 in | Wt 265.8 lb

## 2016-09-02 DIAGNOSIS — Z1151 Encounter for screening for human papillomavirus (HPV): Secondary | ICD-10-CM | POA: Insufficient documentation

## 2016-09-02 DIAGNOSIS — Z01411 Encounter for gynecological examination (general) (routine) with abnormal findings: Secondary | ICD-10-CM | POA: Diagnosis not present

## 2016-09-02 DIAGNOSIS — Z0000 Encounter for general adult medical examination without abnormal findings: Secondary | ICD-10-CM

## 2016-09-02 DIAGNOSIS — E785 Hyperlipidemia, unspecified: Secondary | ICD-10-CM

## 2016-09-02 NOTE — Progress Notes (Signed)
Pre visit review using our clinic review tool, if applicable. No additional management support is needed unless otherwise documented below in the visit note. 

## 2016-09-02 NOTE — Progress Notes (Signed)
Subjective:    Patient ID: Miranda Aguilar, female    DOB: 1961/12/04, 55 y.o.   MRN: [id number]  CC: Miranda Aguilar is a 55 y.o. female who presents today for physical exam.    HPI: Patient for a complete physical exam. She's feeling well today no complaints.  Weight- Started Saxenda. 01/2016. Plateaued out lately. Will consider coming off at end of year.   GERD- Stable.Fluctuates with stress. Resolves with Prilosec. Unable to identify food trigger. No unintentional weight loss, dysphagia, night sweats.  Screening labs were done in January 2017. Noted slight elevation of cholesterol and triglycerides. Will check today as well.     Colorectal Cancer Screening: UTD  Breast Cancer Screening: Mammogram UTD; following q6 mos, right mass.  Cervical Cancer Screening: Will do today. No h/o abnormal pap. Bone Health screening/DEXA for 65+: No increased fracture risk. Defer screening at this time Immunizations       Tetanus - UTD Hepatitis C screening - Candidate for HIV Screening- Candidate for; declines  Labs: Screening labs today. Exercise: Gets regular exercise.  Alcohol use: Occasionally.  Smoking/tobacco use: Nonsmoker.    HISTORY:  Past Medical History:  Diagnosis Date  . Chicken pox   . Constipation    Occasionally  . 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 GERD (gastroesophageal reflux disease)    Heartburn  . Hypertension    01/2015  . Obesity     Past Surgical History:  Procedure Laterality Date  . COLONOSCOPY N/A 05/22/2015   Procedure: COLONOSCOPY;  Surgeon: Danie Binder, MD;  Location: [facility];  Service: Endoscopy;  Laterality: N/A;  8:30 AM  . LAPAROTOMY N/A 02/01/2015   Procedure: EXPLORATORY LAPAROTOMY, MECKEL'S DIVERTICULECTOMY;  Surgeon: Jamesetta So, MD;  Location: [facility];  Service: General;  Laterality: N/A;  . TONSILLECTOMY    . WISDOM TOOTH EXTRACTION     Family History  Problem Relation Age of Onset  . Cancer Mother   . Diabetes Mother   .  Hyperlipidemia Mother   . Heart disease Mother   . Cancer Father   . Stroke Father   . Diabetes Sister   . Osteoporosis Sister   . Diabetes Brother   . Breast cancer Paternal Aunt 25      ALLERGIES: Review of patient's allergies indicates no known allergies.  Current Outpatient Prescriptions on File Prior to Visit  Medication Sig Dispense Refill  . cholecalciferol (VITAMIN D) 1000 units tablet Take 1,000 Units by mouth daily.    Marland Kitchen ibuprofen (ADVIL,MOTRIN) 200 MG tablet Take 400 mg by mouth every 8 (eight) hours as needed (pain).    . metoprolol succinate (TOPROL-XL) 50 MG 24 hr tablet TAKE 1 TABLET BY MOUTH ONCE DAILY FOLLOWING A MEAL. 90 tablet 2  . omeprazole (PRILOSEC) 20 MG capsule take 1 capsule by mouth once daily 30 capsule 5  . SAXENDA 18 MG/3ML SOPN inject 3 milligram INTO THE SKIN once daily 3 pen 1   No current facility-administered medications on file prior to visit.     Social History  Substance Use Topics  . Smoking status: Never Smoker  . Smokeless tobacco: Never Used  . Alcohol use 3.0 oz/week    5 Glasses of wine per week    Review of Systems  Constitutional: Negative for chills, fever and unexpected weight change.  HENT: Negative for congestion and trouble swallowing.   Respiratory: Negative for cough.   Cardiovascular: Negative for chest pain, palpitations and leg swelling.  Gastrointestinal: Negative  for nausea and vomiting.  Musculoskeletal: Negative for arthralgias and myalgias.  Skin: Negative for rash.  Neurological: Negative for headaches.  Hematological: Negative for adenopathy.  Psychiatric/Behavioral: Negative for confusion.      Objective:    BP 140/80   Pulse 68   Temp 97.6 F (36.4 C) (Oral)   Ht 5\' 6"  (1.676 m)   Wt 265 lb 12.8 oz (120.6 kg)   SpO2 98%   BMI 42.90 kg/m   BP Readings from Last 3 Encounters:  09/02/16 140/80  06/27/16 128/72  06/18/16 114/84   Wt Readings from Last 3 Encounters:  09/02/16 265 lb 12.8 oz  (120.6 kg)  06/27/16 259 lb (117.5 kg)  06/18/16 257 lb 12.8 oz (116.9 kg)    Physical Exam  Constitutional: She appears well-developed and well-nourished.  Eyes: Conjunctivae are normal.  Neck: No thyroid mass and no thyromegaly present.  Cardiovascular: Normal rate, regular rhythm, normal heart sounds and normal pulses.   Pulmonary/Chest: Effort normal and breath sounds normal. She has no wheezes. She has no rhonchi. She has no rales. Right breast exhibits no inverted nipple, no mass, no nipple discharge, no skin change and no tenderness. Left breast exhibits no inverted nipple, no mass, no nipple discharge, no skin change and no tenderness. Breasts are symmetrical.  No masses or asymmetry appreciated during CBE.  Genitourinary: No breast swelling, tenderness, discharge or bleeding. Uterus is not enlarged, not fixed and not tender. Cervix exhibits no motion tenderness, no discharge and no friability. Right adnexum displays no mass, no tenderness and no fullness. Left adnexum displays no mass, no tenderness and no fullness.  Genitourinary Comments: Pap performed. No CMT. Unable to appreciated ovaries.  Lymphadenopathy:       Head (right side): No submental, no submandibular, no tonsillar, no preauricular, no posterior auricular and no occipital adenopathy present.       Head (left side): No submental, no submandibular, no tonsillar, no preauricular, no posterior auricular and no occipital adenopathy present.       Right cervical: No superficial cervical, no deep cervical and no posterior cervical adenopathy present.      Left cervical: No superficial cervical, no deep cervical and no posterior cervical adenopathy present.    She has no axillary adenopathy.       Right axillary: No pectoral and no lateral adenopathy present.       Left axillary: No pectoral and no lateral adenopathy present. Neurological: She is alert.  Skin: Skin is warm and dry.  Psychiatric: She has a normal mood and  affect. Her speech is normal and behavior is normal. Thought content normal.  Vitals reviewed.      Assessment & Plan:   Problem List Items Addressed This Visit      Other   Routine physical examination - Primary    Patient is up-to-date on screening labs. We going to repeat lipid panel the patient when is fasting as elevated in 12/2015. Up-to-date on immunizations. Patient had mammogram this year of which they are following a mass in the right breast every 6 months. Pap smear done today along with clinical breast exam. Continue to encouraged healthy diet and exercise.      Relevant Orders   Hepatitis C antibody   Cytology - PAP    Other Visit Diagnoses    Hyperlipidemia       Relevant Orders   Lipid panel       I am having Ms. Zaborski maintain her ibuprofen, cholecalciferol, omeprazole,  metoprolol succinate, SAXENDA, and NOVOFINE.   Meds ordered this encounter  Medications  . NOVOFINE 32G X 6 MM MISC    Refill:  0    Return precautions given.   Risks, benefits, and alternatives of the medications and treatment plan prescribed today were discussed, and patient expressed understanding.   Education regarding symptom management and diagnosis given to patient on AVS.   Continue to follow with Mable Paris, FNP for routine health maintenance.   Miranda Aguilar and I agreed with plan.   Mable Paris, FNP

## 2016-09-02 NOTE — Patient Instructions (Signed)
Health Maintenance, Female Adopting a healthy lifestyle and getting preventive care can go a long way to promote health and wellness. Talk with your health care provider about what schedule of regular examinations is right for you. This is a good chance for you to check in with your provider about disease prevention and staying healthy. In between checkups, there are plenty of things you can do on your own. Experts have done a lot of research about which lifestyle changes and preventive measures are most likely to keep you healthy. Ask your health care provider for more information. WEIGHT AND DIET  Eat a healthy diet  Be sure to include plenty of vegetables, fruits, low-fat dairy products, and lean protein.  Do not eat a lot of foods high in solid fats, added sugars, or salt.  Get regular exercise. This is one of the most important things you can do for your health.  Most adults should exercise for at least 150 minutes each week. The exercise should increase your heart rate and make you sweat (moderate-intensity exercise).  Most adults should also do strengthening exercises at least twice a week. This is in addition to the moderate-intensity exercise.  Maintain a healthy weight  Body mass index (BMI) is a measurement that can be used to identify possible weight problems. It estimates body fat based on height and weight. Your health care provider can help determine your BMI and help you achieve or maintain a healthy weight.  For females 20 years of age and older:   A BMI below 18.5 is considered underweight.  A BMI of 18.5 to 24.9 is normal.  A BMI of 25 to 29.9 is considered overweight.  A BMI of 30 and above is considered obese.  Watch levels of cholesterol and blood lipids  You should start having your blood tested for lipids and cholesterol at 55 years of age, then have this test every 5 years.  You may need to have your cholesterol levels checked more often if:  Your lipid  or cholesterol levels are high.  You are older than 55 years of age.  You are at high risk for heart disease.  CANCER SCREENING   Lung Cancer  Lung cancer screening is recommended for adults 55-80 years old who are at high risk for lung cancer because of a history of smoking.  A yearly low-dose CT scan of the lungs is recommended for people who:  Currently smoke.  Have quit within the past 15 years.  Have at least a 30-pack-year history of smoking. A pack year is smoking an average of one pack of cigarettes a day for 1 year.  Yearly screening should continue until it has been 15 years since you quit.  Yearly screening should stop if you develop a health problem that would prevent you from having lung cancer treatment.  Breast Cancer  Practice breast self-awareness. This means understanding how your breasts normally appear and feel.  It also means doing regular breast self-exams. Let your health care provider know about any changes, no matter how small.  If you are in your 20s or 30s, you should have a clinical breast exam (CBE) by a health care provider every 1-3 years as part of a regular health exam.  If you are 40 or older, have a CBE every year. Also consider having a breast X-ray (mammogram) every year.  If you have a family history of breast cancer, talk to your health care provider about genetic screening.  If you   are at high risk for breast cancer, talk to your health care provider about having an MRI and a mammogram every year.  Breast cancer gene (BRCA) assessment is recommended for women who have family members with BRCA-related cancers. BRCA-related cancers include:  Breast.  Ovarian.  Tubal.  Peritoneal cancers.  Results of the assessment will determine the need for genetic counseling and BRCA1 and BRCA2 testing. Cervical Cancer Your health care provider may recommend that you be screened regularly for cancer of the pelvic organs (ovaries, uterus, and  vagina). This screening involves a pelvic examination, including checking for microscopic changes to the surface of your cervix (Pap test). You may be encouraged to have this screening done every 3 years, beginning at age 21.  For women ages 30-65, health care providers may recommend pelvic exams and Pap testing every 3 years, or they may recommend the Pap and pelvic exam, combined with testing for human papilloma virus (HPV), every 5 years. Some types of HPV increase your risk of cervical cancer. Testing for HPV may also be done on women of any age with unclear Pap test results.  Other health care providers may not recommend any screening for nonpregnant women who are considered low risk for pelvic cancer and who do not have symptoms. Ask your health care provider if a screening pelvic exam is right for you.  If you have had past treatment for cervical cancer or a condition that could lead to cancer, you need Pap tests and screening for cancer for at least 20 years after your treatment. If Pap tests have been discontinued, your risk factors (such as having a new sexual partner) need to be reassessed to determine if screening should resume. Some women have medical problems that increase the chance of getting cervical cancer. In these cases, your health care provider may recommend more frequent screening and Pap tests. Colorectal Cancer  This type of cancer can be detected and often prevented.  Routine colorectal cancer screening usually begins at 55 years of age and continues through 55 years of age.  Your health care provider may recommend screening at an earlier age if you have risk factors for colon cancer.  Your health care provider may also recommend using home test kits to check for hidden blood in the stool.  A small camera at the end of a tube can be used to examine your colon directly (sigmoidoscopy or colonoscopy). This is done to check for the earliest forms of colorectal  cancer.  Routine screening usually begins at age 50.  Direct examination of the colon should be repeated every 5-10 years through 55 years of age. However, you may need to be screened more often if early forms of precancerous polyps or small growths are found. Skin Cancer  Check your skin from head to toe regularly.  Tell your health care provider about any new moles or changes in moles, especially if there is a change in a mole's shape or color.  Also tell your health care provider if you have a mole that is larger than the size of a pencil eraser.  Always use sunscreen. Apply sunscreen liberally and repeatedly throughout the day.  Protect yourself by wearing long sleeves, pants, a wide-brimmed hat, and sunglasses whenever you are outside. HEART DISEASE, DIABETES, AND HIGH BLOOD PRESSURE   High blood pressure causes heart disease and increases the risk of stroke. High blood pressure is more likely to develop in:  People who have blood pressure in the high end   of the normal range (130-139/85-89 mm Hg).  People who are overweight or obese.  People who are African American.  If you are 38-23 years of age, have your blood pressure checked every 3-5 years. If you are 61 years of age or older, have your blood pressure checked every year. You should have your blood pressure measured twice--once when you are at a hospital or clinic, and once when you are not at a hospital or clinic. Record the average of the two measurements. To check your blood pressure when you are not at a hospital or clinic, you can use:  An automated blood pressure machine at a pharmacy.  A home blood pressure monitor.  If you are between 45 years and 39 years old, ask your health care provider if you should take aspirin to prevent strokes.  Have regular diabetes screenings. This involves taking a blood sample to check your fasting blood sugar level.  If you are at a normal weight and have a low risk for diabetes,  have this test once every three years after 55 years of age.  If you are overweight and have a high risk for diabetes, consider being tested at a younger age or more often. PREVENTING INFECTION  Hepatitis B  If you have a higher risk for hepatitis B, you should be screened for this virus. You are considered at high risk for hepatitis B if:  You were born in a country where hepatitis B is common. Ask your health care provider which countries are considered high risk.  Your parents were born in a high-risk country, and you have not been immunized against hepatitis B (hepatitis B vaccine).  You have HIV or AIDS.  You use needles to inject street drugs.  You live with someone who has hepatitis B.  You have had sex with someone who has hepatitis B.  You get hemodialysis treatment.  You take certain medicines for conditions, including cancer, organ transplantation, and autoimmune conditions. Hepatitis C  Blood testing is recommended for:  Everyone born from [birth year] through 1965.  Anyone with known risk factors for hepatitis C. Sexually transmitted infections (STIs)  You should be screened for sexually transmitted infections (STIs) including gonorrhea and chlamydia if:  You are sexually active and are younger than 55 years of age.  You are older than 55 years of age and your health care provider tells you that you are at risk for this type of infection.  Your sexual activity has changed since you were last screened and you are at an increased risk for chlamydia or gonorrhea. Ask your health care provider if you are at risk.  If you do not have HIV, but are at risk, it may be recommended that you take a prescription medicine daily to prevent HIV infection. This is called pre-exposure prophylaxis (PrEP). You are considered at risk if:  You are sexually active and do not regularly use condoms or know the HIV status of your partner(s).  You take drugs by injection.  You are sexually  active with a partner who has HIV. Talk with your health care provider about whether you are at high risk of being infected with HIV. If you choose to begin PrEP, you should first be tested for HIV. You should then be tested every 3 months for as long as you are taking PrEP.  PREGNANCY   If you are premenopausal and you may become pregnant, ask your health care provider about preconception counseling.  If you may  become pregnant, take 400 to 800 micrograms (mcg) of folic acid every day.  If you want to prevent pregnancy, talk to your health care provider about birth control (contraception). OSTEOPOROSIS AND MENOPAUSE   Osteoporosis is a disease in which the bones lose minerals and strength with aging. This can result in serious bone fractures. Your risk for osteoporosis can be identified using a bone density scan.  If you are 63 years of age or older, or if you are at risk for osteoporosis and fractures, ask your health care provider if you should be screened.  Ask your health care provider whether you should take a calcium or vitamin D supplement to lower your risk for osteoporosis.  Menopause may have certain physical symptoms and risks.  Hormone replacement therapy may reduce some of these symptoms and risks. Talk to your health care provider about whether hormone replacement therapy is right for you.  HOME CARE INSTRUCTIONS   Schedule regular health, dental, and eye exams.  Stay current with your immunizations.   Do not use any tobacco products including cigarettes, chewing tobacco, or electronic cigarettes.  If you are pregnant, do not drink alcohol.  If you are breastfeeding, limit how much and how often you drink alcohol.  Limit alcohol intake to no more than 1 drink per day for nonpregnant women. One drink equals 12 ounces of beer, 5 ounces of wine, or 1 ounces of hard liquor.  Do not use street drugs.  Do not share needles.  Ask your health care provider for help if  you need support or information about quitting drugs.  Tell your health care provider if you often feel depressed.  Tell your health care provider if you have ever been abused or do not feel safe at home.   This information is not intended to replace advice given to you by your health care provider. Make sure you discuss any questions you have with your health care provider.   Document Released: 06/10/2011 Document Revised: 12/16/2014 Document Reviewed: 10/27/2013 Elsevier Interactive Patient Education [employer].

## 2016-09-02 NOTE — Assessment & Plan Note (Signed)
Patient is up-to-date on screening labs. We going to repeat lipid panel the patient when is fasting as elevated in 12/2015. Up-to-date on immunizations. Patient had mammogram this year of which they are following a mass in the right breast every 6 months. Pap smear done today along with clinical breast exam. Continue to encouraged healthy diet and exercise.

## 2016-09-04 LAB — CYTOLOGY - PAP

## 2016-09-05 ENCOUNTER — Other Ambulatory Visit (INDEPENDENT_AMBULATORY_CARE_PROVIDER_SITE_OTHER): Payer: BLUE CROSS/BLUE SHIELD

## 2016-09-05 DIAGNOSIS — Z0000 Encounter for general adult medical examination without abnormal findings: Secondary | ICD-10-CM | POA: Diagnosis not present

## 2016-09-05 DIAGNOSIS — E785 Hyperlipidemia, unspecified: Secondary | ICD-10-CM | POA: Diagnosis not present

## 2016-09-05 LAB — LIPID PANEL
CHOLESTEROL: 190 mg/dL (ref 0–200)
HDL: 57.1 mg/dL (ref >39.00)
LDL CALC: 113 mg/dL — AB (ref 0–99)
NonHDL: 133.23
TRIGLYCERIDES: 101 mg/dL (ref 0.0–149.0)
Total CHOL/HDL Ratio: 3
VLDL: 20.2 mg/dL (ref 0.0–40.0)

## 2016-09-05 NOTE — Progress Notes (Signed)
Labs

## 2016-09-06 LAB — HEPATITIS C ANTIBODY: HCV AB: NEGATIVE

## 2016-09-10 ENCOUNTER — Other Ambulatory Visit: Payer: Self-pay | Admitting: Family

## 2016-09-10 DIAGNOSIS — R87619 Unspecified abnormal cytological findings in specimens from cervix uteri: Secondary | ICD-10-CM

## 2016-09-11 ENCOUNTER — Telehealth: Payer: Self-pay | Admitting: Family

## 2016-09-11 NOTE — Telephone Encounter (Signed)
Pt called regarding results. Thank you!  Call pt@ [phone]

## 2016-09-11 NOTE — Telephone Encounter (Signed)
Patient has been informed.

## 2016-09-11 NOTE — Telephone Encounter (Signed)
Pt called and stated that she spoke with [facility] clinic and they would like the abnormal report sent over before they schedule her appointment. Fax # [phone]  Call pt @ [phone]

## 2016-09-12 NOTE — Telephone Encounter (Signed)
Results of the abnormal pap have been faxed to [facility] clinic electronically.

## 2016-09-18 NOTE — Telephone Encounter (Signed)
Left message letting patient that results have been re faxed via electronically to [facility] clinic.

## 2016-09-18 NOTE — Telephone Encounter (Signed)
Pt called back stating that [facility] has not received her pap results. Please advise? Thank you!

## 2016-10-09 DIAGNOSIS — R87618 Other abnormal cytological findings on specimens from cervix uteri: Secondary | ICD-10-CM | POA: Diagnosis not present

## 2016-10-09 DIAGNOSIS — N840 Polyp of corpus uteri: Secondary | ICD-10-CM | POA: Diagnosis not present

## 2016-10-14 ENCOUNTER — Telehealth: Payer: Self-pay | Admitting: *Deleted

## 2016-10-14 DIAGNOSIS — N6310 Unspecified lump in the right breast, unspecified quadrant: Secondary | ICD-10-CM

## 2016-10-14 NOTE — Telephone Encounter (Signed)
Pt requested a order for a 6 month follow up for a mammogram  Pt contact [phone]

## 2016-10-14 NOTE — Telephone Encounter (Signed)
Patient is needing order for [place] on breast per last mammogram.  Please advise.

## 2016-10-14 NOTE — Telephone Encounter (Signed)
Please call patient-  [place] and mammogram of right breast has been ordered.

## 2016-10-15 ENCOUNTER — Telehealth: Payer: Self-pay | Admitting: *Deleted

## 2016-10-15 DIAGNOSIS — Z1239 Encounter for other screening for malignant neoplasm of breast: Secondary | ICD-10-CM

## 2016-10-15 NOTE — Telephone Encounter (Signed)
Patient requested to have a new order for her mammogram extended beyond dec 9, due to insurance issues. Pt will have this done [facility] Mid December  Pt contact [phone]

## 2016-10-15 NOTE — Telephone Encounter (Signed)
Please advise 

## 2016-10-15 NOTE — Telephone Encounter (Signed)
Patient has been informed.

## 2016-10-16 NOTE — Telephone Encounter (Signed)
Ordered

## 2016-10-17 ENCOUNTER — Other Ambulatory Visit: Payer: Self-pay | Admitting: Family

## 2016-10-17 DIAGNOSIS — Z1239 Encounter for other screening for malignant neoplasm of breast: Secondary | ICD-10-CM

## 2016-10-21 ENCOUNTER — Other Ambulatory Visit: Payer: Self-pay | Admitting: Family

## 2016-10-21 ENCOUNTER — Encounter: Payer: Self-pay | Admitting: Family

## 2016-10-21 DIAGNOSIS — N6310 Unspecified lump in the right breast, unspecified quadrant: Secondary | ICD-10-CM

## 2016-11-13 ENCOUNTER — Ambulatory Visit
Admission: RE | Admit: 2016-11-13 | Discharge: 2016-11-13 | Disposition: A | Discharge status: Home / Self Care | Payer: BLUE CROSS/BLUE SHIELD | Source: Ambulatory Visit | Attending: Family | Admitting: Family

## 2016-11-13 ENCOUNTER — Other Ambulatory Visit: Payer: Self-pay | Admitting: Family

## 2016-11-13 DIAGNOSIS — N6310 Unspecified lump in the right breast, unspecified quadrant: Secondary | ICD-10-CM | POA: Diagnosis not present

## 2016-11-13 DIAGNOSIS — N6311 Unspecified lump in the right breast, upper outer quadrant: Secondary | ICD-10-CM | POA: Diagnosis not present

## 2017-01-14 ENCOUNTER — Other Ambulatory Visit: Payer: Self-pay | Admitting: Family

## 2017-01-23 ENCOUNTER — Other Ambulatory Visit: Payer: Self-pay | Admitting: Family

## 2017-01-23 NOTE — Telephone Encounter (Signed)
Pt called requesting this medication. Please advise, thank you!  Pharmacy - [facility], [place]

## 2017-04-28 ENCOUNTER — Other Ambulatory Visit: Payer: Self-pay | Admitting: Internal Medicine

## 2017-05-02 ENCOUNTER — Telehealth: Payer: Self-pay | Admitting: Family

## 2017-05-02 DIAGNOSIS — N6310 Unspecified lump in the right breast, unspecified quadrant: Secondary | ICD-10-CM

## 2017-05-02 NOTE — Telephone Encounter (Signed)
Pt lvm on referral line requesting orders to be sent to [facility] for additional imaging of her breast. It is not due till 6/15 or after.

## 2017-05-02 NOTE — Telephone Encounter (Signed)
Please advise, thanks.

## 2017-05-07 NOTE — Telephone Encounter (Signed)
Call pt  Let her know right breast US and diagnositc bilateral mammogram ordered  She may call and schedule

## 2017-05-07 NOTE — Telephone Encounter (Signed)
Patient has been notified

## 2017-05-22 ENCOUNTER — Encounter: Payer: Self-pay | Admitting: *Deleted

## 2017-05-22 ENCOUNTER — Ambulatory Visit (INDEPENDENT_AMBULATORY_CARE_PROVIDER_SITE_OTHER): Payer: BLUE CROSS/BLUE SHIELD | Admitting: General Surgery

## 2017-05-22 ENCOUNTER — Encounter: Payer: Self-pay | Admitting: General Surgery

## 2017-05-22 VITALS — BP 128/84 | HR 72 | Resp 16 | Ht 66.0 in | Wt 278.0 lb

## 2017-05-22 DIAGNOSIS — K429 Umbilical hernia without obstruction or gangrene: Secondary | ICD-10-CM | POA: Diagnosis not present

## 2017-05-22 NOTE — Patient Instructions (Signed)
Laparoscopic hernia repair recommended. Patient advised to call with any questions or concerns. Hernia, Adult A hernia is the bulging of an organ or tissue through a weak spot in the muscles of the abdomen (abdominal wall). Hernias develop most often near the navel or groin. There are many kinds of hernias. Common kinds include:  Femoral hernia. This kind of hernia develops under the groin in the upper thigh area.  Inguinal hernia. This kind of hernia develops in the groin or scrotum.  Umbilical hernia. This kind of hernia develops near the navel.  Hiatal hernia. This kind of hernia causes part of the stomach to be pushed up into the chest.  Incisional hernia. This kind of hernia bulges through a scar from an abdominal surgery.  What are the causes? This condition may be caused by:  Heavy lifting.  Coughing over a long period of time.  Straining to have a bowel movement.  An incision made during an abdominal surgery.  A birth defect (congenital defect).  Excess weight or obesity.  Smoking.  Poor nutrition.  Cystic fibrosis.  Excess fluid in the abdomen.  Undescended testicles.  What are the signs or symptoms? Symptoms of a hernia include:  A lump on the abdomen. This is the first sign of a hernia. The lump may become more obvious with standing, straining, or coughing. It may get bigger over time if it is not treated or if the condition causing it is not treated.  Pain. A hernia is usually painless, but it may become painful over time if treatment is delayed. The pain is usually dull and may get worse with standing or lifting heavy objects.  Sometimes a hernia gets tightly squeezed in the weak spot (strangulated) or stuck there (incarcerated) and causes additional symptoms. These symptoms may include:  Vomiting.  Nausea.  Constipation.  Irritability.  How is this diagnosed? A hernia may be diagnosed with:  A physical exam. During the exam your health care  provider may ask you to cough or to make a specific movement, because a hernia is usually more visible when you move.  Imaging tests. These can include: ? X-rays. ? Ultrasound. ? CT scan.  How is this treated? A hernia that is small and painless may not need to be treated. A hernia that is large or painful may be treated with surgery. Inguinal hernias may be treated with surgery to prevent incarceration or strangulation. Strangulated hernias are always treated with surgery, because lack of blood to the trapped organ or tissue can cause it to die. Surgery to treat a hernia involves pushing the bulge back into place and repairing the weak part of the abdomen. Follow these instructions at home:  Avoid straining.  Do not lift anything heavier than 10 lb (4.5 kg).  Lift with your leg muscles, not your back muscles. This helps avoid strain.  When coughing, try to cough gently.  Prevent constipation. Constipation leads to straining with bowel movements, which can make a hernia worse or cause a hernia repair to break down. You can prevent constipation by: ? Eating a high-fiber diet that includes plenty of fruits and vegetables. ? Drinking enough fluids to keep your urine clear or pale yellow. Aim to drink 6-8 glasses of water per day. ? Using a stool softener as directed by your health care provider.  Lose weight, if you are overweight.  Do not use any tobacco products, including cigarettes, chewing tobacco, or electronic cigarettes. If you need help quitting, ask your health  care provider.  Keep all follow-up visits as directed by your health care provider. This is important. Your health care provider may need to monitor your condition. Contact a health care provider if:  You have swelling, redness, and pain in the affected area.  Your bowel habits change. Get help right away if:  You have a fever.  You have abdominal pain that is getting worse.  You feel nauseous or you  vomit.  You cannot push the hernia back in place by gently pressing on it while you are lying down.  The hernia: ? Changes in shape or size. ? Is stuck outside the abdomen. ? Becomes discolored. ? Feels hard or tender. This information is not intended to replace advice given to you by your health care provider. Make sure you discuss any questions you have with your health care provider. Document Released: 11/25/2005 Document Revised: 04/24/2016 Document Reviewed: 10/05/2014 Elsevier Interactive Patient Education  2017 [employer].

## 2017-05-22 NOTE — Progress Notes (Signed)
Patient ID: Miranda Aguilar, female   DOB: Sep 17, 1961, 56 y.o.   MRN: [id number]  Chief Complaint  Patient presents with  . Pre-op Exam    HPI Miranda Aguilar is a 56 y.o. female.  Here today for preop exam, umbilical hernia surgery. She was seen last year for the hernia, surgery was recommended. She denies pain or other symptoms currently, says this summer is just a good time for her to have her surgery done. HPI  Past Medical History:  Diagnosis Date  . Chicken pox   . Constipation    Occasionally  . 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 GERD (gastroesophageal reflux disease)    Heartburn  . Hypertension    01/2015  . Obesity     Past Surgical History:  Procedure Laterality Date  . COLONOSCOPY N/A 05/22/2015   Procedure: COLONOSCOPY;  Surgeon: Danie Binder, MD;  Location: [facility];  Service: Endoscopy;  Laterality: N/A;  8:30 AM  . LAPAROTOMY N/A 02/01/2015   Procedure: EXPLORATORY LAPAROTOMY, MECKEL'S DIVERTICULECTOMY;  Surgeon: Jamesetta So, MD;  Location: [facility];  Service: General;  Laterality: N/A;  . TONSILLECTOMY    . WISDOM TOOTH EXTRACTION      Family History  Problem Relation Age of Onset  . Cancer Mother   . Diabetes Mother   . Hyperlipidemia Mother   . Heart disease Mother   . Cancer Father   . Stroke Father   . Diabetes Sister   . Osteoporosis Sister   . Diabetes Brother   . Breast cancer Paternal Aunt 93    Social History Social History  Substance Use Topics  . Smoking status: Never Smoker  . Smokeless tobacco: Never Used  . Alcohol use 3.0 oz/week    5 Glasses of wine per week    No Known Allergies  Current Outpatient Prescriptions  Medication Sig Dispense Refill  . cholecalciferol (VITAMIN D) 1000 units tablet Take 1,000 Units by mouth daily.    Marland Kitchen ibuprofen (ADVIL,MOTRIN) 200 MG tablet Take 400 mg by mouth every 8 (eight) hours as needed (pain).    . metoprolol succinate (TOPROL-XL) 50 MG 24 hr tablet take 1 tablet once daily with food 90  tablet 2  . omeprazole (PRILOSEC) 20 MG capsule take 1 capsule by mouth once daily 30 capsule 5   No current facility-administered medications for this visit.     Review of Systems Review of Systems  Constitutional: Negative.   Respiratory: Negative.   Cardiovascular: Negative.   Gastrointestinal: Negative for abdominal pain, nausea and vomiting.    Blood pressure 128/84, pulse 72, resp. rate 16, height 5\' 6"  (1.676 m), weight 278 lb (126.1 kg).  Physical Exam Physical Exam  Constitutional: She is oriented to person, place, and time. She appears well-developed and well-nourished.  Cardiovascular: Normal rate, regular rhythm and normal heart sounds.   Pulmonary/Chest: Effort normal and breath sounds normal.  Abdominal: Normal appearance and bowel sounds are normal. There is no hepatomegaly.    Lymphadenopathy:    She has no cervical adenopathy.  Neurological: She is alert and oriented to person, place, and time.  Skin: Skin is warm and dry.  Psychiatric: She has a normal mood and affect. Her behavior is normal.   Data Reviewed Prior notes reviewed.  Assessment    Umbilical hernia- approx 2 cm, containing fatty tissue, does not seem to contain bowel. Last colonoscopy in 2016- HR was an issue during procedure (bradycardia) Stable exam otherwise    Plan  Hernia repair recommended.  Procedure and risks /benefits explained Patient advised to call with any questions or concerns.      HPI, Physical Exam, Assessment and Plan have been scribed under the direction and in the presence of Mckinley Jewel, MD  Karie Fetch, RN  I have completed the exam and reviewed the above documentation for accuracy and completeness.  I agree with the above.  Haematologist has been used and any errors in dictation or transcription are unintentional.  Rosaire Cueto G. Jamal Collin, M.D., F.A.C.S.  Junie Panning G 05/22/2017, 12:03 PM

## 2017-05-22 NOTE — Progress Notes (Signed)
Patient's umbilical hernia repair has been scheduled for 06-20-17 at [facility].

## 2017-06-06 ENCOUNTER — Ambulatory Visit
Admission: RE | Admit: 2017-06-06 | Discharge: 2017-06-06 | Disposition: A | Discharge status: Home / Self Care | Payer: BLUE CROSS/BLUE SHIELD | Source: Ambulatory Visit | Attending: Family | Admitting: Family

## 2017-06-06 ENCOUNTER — Encounter: Payer: Self-pay | Admitting: Radiology

## 2017-06-06 DIAGNOSIS — N6310 Unspecified lump in the right breast, unspecified quadrant: Secondary | ICD-10-CM | POA: Diagnosis not present

## 2017-06-06 DIAGNOSIS — R928 Other abnormal and inconclusive findings on diagnostic imaging of breast: Secondary | ICD-10-CM | POA: Diagnosis not present

## 2017-06-06 DIAGNOSIS — N6489 Other specified disorders of breast: Secondary | ICD-10-CM | POA: Diagnosis not present

## 2017-06-09 ENCOUNTER — Other Ambulatory Visit: Payer: Self-pay | Admitting: General Surgery

## 2017-06-09 DIAGNOSIS — K429 Umbilical hernia without obstruction or gangrene: Secondary | ICD-10-CM

## 2017-06-12 ENCOUNTER — Encounter
Admission: RE | Admit: 2017-06-12 | Discharge: 2017-06-12 | Disposition: A | Discharge status: Home / Self Care | Payer: BLUE CROSS/BLUE SHIELD | Source: Ambulatory Visit | Attending: General Surgery | Admitting: General Surgery

## 2017-06-12 DIAGNOSIS — K429 Umbilical hernia without obstruction or gangrene: Secondary | ICD-10-CM | POA: Insufficient documentation

## 2017-06-12 DIAGNOSIS — Z01818 Encounter for other preprocedural examination: Secondary | ICD-10-CM | POA: Diagnosis not present

## 2017-06-12 NOTE — Patient Instructions (Signed)
Your procedure is scheduled on: Friday 06/20/17 Report to [facility]. 2ND FLOOR MEDICAL MALL ENTRANCE. To find out your arrival time please call [phone] between 1PM - 3PM on Thursday 06/19/17.  Remember: Instructions that are not followed completely may result in serious medical risk, up to and including death, or upon the discretion of your surgeon and anesthesiologist your surgery may need to be rescheduled.    __X__ 1. Do not eat food or drink liquids after midnight. No gum chewing or hard candies.     __X__ 2. No Alcohol for 24 hours before or after surgery.   ____ 3. Bring all medications with you on the day of surgery if instructed.    __X__ 4. Notify your doctor if there is any change in your medical condition     (cold, fever, infections).             __X___5. No smoking within 24 hours of your surgery.     Do not wear jewelry, make-up, hairpins, clips or nail polish.  Do not wear lotions, powders, or perfumes.   Do not shave 48 hours prior to surgery. Men may shave face and neck.  Do not bring valuables to the hospital.    [facility] is not responsible for any belongings or valuables.               Contacts, dentures or bridgework may not be worn into surgery.  Leave your suitcase in the car. After surgery it may be brought to your room.  For patients admitted to the hospital, discharge time is determined by your                treatment team.   Patients discharged the day of surgery will not be allowed to drive home.   Please read over the following fact sheets that you were given:   MRSA Information   __X__ Take these medicines the morning of surgery with A SIP OF WATER:    1. METOPROLOL  2. OMEPRAZOLE  3.   4.  5.  6.  ____ Fleet Enema (as directed)   __X__ Use CHG Soap as directed  ____ Use inhalers on the day of surgery  ____ Stop metformin 2 days prior to surgery    ____ Take 1/2 of usual insulin dose the night before surgery and none on the  morning of surgery.   ____ Stop Coumadin/Plavix/aspirin on   __X__ Stop Anti-inflammatories such as Advil, Aleve, Ibuprofen, Motrin, Naproxen, Naprosyn, Goodies,powder, or aspirin products.  OK to take Tylenol.   ____ Stop supplements until after surgery.    ____ Bring C-Pap to the hospital.

## 2017-06-17 ENCOUNTER — Encounter
Admission: RE | Admit: 2017-06-17 | Discharge: 2017-06-17 | Disposition: A | Discharge status: Home / Self Care | Payer: BLUE CROSS/BLUE SHIELD | Source: Ambulatory Visit | Attending: General Surgery | Admitting: General Surgery

## 2017-06-17 DIAGNOSIS — Z823 Family history of stroke: Secondary | ICD-10-CM | POA: Diagnosis not present

## 2017-06-17 DIAGNOSIS — E669 Obesity, unspecified: Secondary | ICD-10-CM | POA: Diagnosis not present

## 2017-06-17 DIAGNOSIS — Z8262 Family history of osteoporosis: Secondary | ICD-10-CM | POA: Diagnosis not present

## 2017-06-17 DIAGNOSIS — Z8619 Personal history of other infectious and parasitic diseases: Secondary | ICD-10-CM | POA: Diagnosis not present

## 2017-06-17 DIAGNOSIS — K439 Ventral hernia without obstruction or gangrene: Secondary | ICD-10-CM | POA: Diagnosis present

## 2017-06-17 DIAGNOSIS — Z6841 Body Mass Index (BMI) 40.0 and over, adult: Secondary | ICD-10-CM | POA: Diagnosis not present

## 2017-06-17 DIAGNOSIS — Z9889 Other specified postprocedural states: Secondary | ICD-10-CM | POA: Diagnosis not present

## 2017-06-17 DIAGNOSIS — Z8349 Family history of other endocrine, nutritional and metabolic diseases: Secondary | ICD-10-CM | POA: Diagnosis not present

## 2017-06-17 DIAGNOSIS — Z833 Family history of diabetes mellitus: Secondary | ICD-10-CM | POA: Diagnosis not present

## 2017-06-17 DIAGNOSIS — Z79899 Other long term (current) drug therapy: Secondary | ICD-10-CM | POA: Diagnosis not present

## 2017-06-17 DIAGNOSIS — K219 Gastro-esophageal reflux disease without esophagitis: Secondary | ICD-10-CM | POA: Diagnosis not present

## 2017-06-17 DIAGNOSIS — Z803 Family history of malignant neoplasm of breast: Secondary | ICD-10-CM | POA: Diagnosis not present

## 2017-06-17 DIAGNOSIS — I10 Essential (primary) hypertension: Secondary | ICD-10-CM | POA: Diagnosis not present

## 2017-06-17 DIAGNOSIS — K432 Incisional hernia without obstruction or gangrene: Secondary | ICD-10-CM | POA: Diagnosis not present

## 2017-06-17 LAB — HEMOGLOBIN: Hemoglobin: 12.7 g/dL (ref 12.0–16.0)

## 2017-06-19 MED ORDER — CEFAZOLIN SODIUM-DEXTROSE 2-4 GM/100ML-% IV SOLN
2.0000 g | INTRAVENOUS | Status: AC
  Administered 2017-06-20: 2 g via INTRAVENOUS

## 2017-06-20 ENCOUNTER — Ambulatory Visit: Payer: BLUE CROSS/BLUE SHIELD | Admitting: Anesthesiology

## 2017-06-20 ENCOUNTER — Ambulatory Visit
Admission: RE | Admit: 2017-06-20 | Discharge: 2017-06-20 | Disposition: A | Discharge status: Home / Self Care | Payer: BLUE CROSS/BLUE SHIELD | Source: Ambulatory Visit | Attending: General Surgery | Admitting: General Surgery

## 2017-06-20 ENCOUNTER — Encounter: Payer: Self-pay | Admitting: *Deleted

## 2017-06-20 ENCOUNTER — Encounter
Admission: RE | Disposition: A | Discharge status: Home / Self Care | Payer: Self-pay | Source: Ambulatory Visit | Attending: General Surgery

## 2017-06-20 DIAGNOSIS — Z823 Family history of stroke: Secondary | ICD-10-CM | POA: Diagnosis not present

## 2017-06-20 DIAGNOSIS — K219 Gastro-esophageal reflux disease without esophagitis: Secondary | ICD-10-CM | POA: Diagnosis not present

## 2017-06-20 DIAGNOSIS — Z8262 Family history of osteoporosis: Secondary | ICD-10-CM | POA: Diagnosis not present

## 2017-06-20 DIAGNOSIS — I10 Essential (primary) hypertension: Secondary | ICD-10-CM | POA: Insufficient documentation

## 2017-06-20 DIAGNOSIS — Z8349 Family history of other endocrine, nutritional and metabolic diseases: Secondary | ICD-10-CM | POA: Diagnosis not present

## 2017-06-20 DIAGNOSIS — Z9889 Other specified postprocedural states: Secondary | ICD-10-CM | POA: Insufficient documentation

## 2017-06-20 DIAGNOSIS — Z8619 Personal history of other infectious and parasitic diseases: Secondary | ICD-10-CM | POA: Diagnosis not present

## 2017-06-20 DIAGNOSIS — Z79899 Other long term (current) drug therapy: Secondary | ICD-10-CM | POA: Diagnosis not present

## 2017-06-20 DIAGNOSIS — K429 Umbilical hernia without obstruction or gangrene: Secondary | ICD-10-CM | POA: Diagnosis not present

## 2017-06-20 DIAGNOSIS — Z6841 Body Mass Index (BMI) 40.0 and over, adult: Secondary | ICD-10-CM | POA: Diagnosis not present

## 2017-06-20 DIAGNOSIS — Z833 Family history of diabetes mellitus: Secondary | ICD-10-CM | POA: Diagnosis not present

## 2017-06-20 DIAGNOSIS — Z803 Family history of malignant neoplasm of breast: Secondary | ICD-10-CM | POA: Diagnosis not present

## 2017-06-20 DIAGNOSIS — E669 Obesity, unspecified: Secondary | ICD-10-CM | POA: Diagnosis not present

## 2017-06-20 DIAGNOSIS — K432 Incisional hernia without obstruction or gangrene: Secondary | ICD-10-CM | POA: Insufficient documentation

## 2017-06-20 HISTORY — DX: Adverse effect of unspecified anesthetic, initial encounter: T41.45XA

## 2017-06-20 HISTORY — PX: INSERTION OF MESH: SHX5868

## 2017-06-20 HISTORY — DX: Other complications of anesthesia, initial encounter: T88.59XA

## 2017-06-20 HISTORY — PX: UMBILICAL HERNIA REPAIR: SHX196

## 2017-06-20 LAB — POCT PREGNANCY, URINE: PREG TEST UR: NEGATIVE

## 2017-06-20 SURGERY — REPAIR, HERNIA, UMBILICAL, ADULT
Anesthesia: General | Wound class: Clean

## 2017-06-20 MED ORDER — KETOROLAC TROMETHAMINE 30 MG/ML IJ SOLN
INTRAMUSCULAR | Status: DC | PRN
  Administered 2017-06-20: 30 mg via INTRAVENOUS

## 2017-06-20 MED ORDER — ACETAMINOPHEN 10 MG/ML IV SOLN
INTRAVENOUS | Status: DC | PRN
  Administered 2017-06-20: 1000 mg via INTRAVENOUS

## 2017-06-20 MED ORDER — GLYCOPYRROLATE 0.2 MG/ML IJ SOLN
INTRAMUSCULAR | Status: AC
  Filled 2017-06-20: qty 1

## 2017-06-20 MED ORDER — DEXAMETHASONE SODIUM PHOSPHATE 10 MG/ML IJ SOLN
INTRAMUSCULAR | Status: DC | PRN
  Administered 2017-06-20: 10 mg via INTRAVENOUS

## 2017-06-20 MED ORDER — ONDANSETRON HCL 4 MG/2ML IJ SOLN
INTRAMUSCULAR | Status: AC
  Filled 2017-06-20: qty 2

## 2017-06-20 MED ORDER — SUGAMMADEX SODIUM 200 MG/2ML IV SOLN
INTRAVENOUS | Status: DC | PRN
  Administered 2017-06-20: 100 mg via INTRAVENOUS

## 2017-06-20 MED ORDER — OXYCODONE-ACETAMINOPHEN 5-325 MG PO TABS: 1.0000 | ORAL_TABLET | Freq: Four times a day (QID) | ORAL | 0 refills | Status: DC | PRN

## 2017-06-20 MED ORDER — OXYCODONE-ACETAMINOPHEN 5-325 MG PO TABS
ORAL_TABLET | ORAL | Status: AC
  Filled 2017-06-20: qty 1

## 2017-06-20 MED ORDER — FENTANYL CITRATE (PF) 100 MCG/2ML IJ SOLN: 25.0000 ug | INTRAMUSCULAR | Status: DC | PRN

## 2017-06-20 MED ORDER — ONDANSETRON HCL 4 MG/2ML IJ SOLN
INTRAMUSCULAR | Status: DC | PRN
  Administered 2017-06-20: 4 mg via INTRAVENOUS

## 2017-06-20 MED ORDER — ONDANSETRON HCL 4 MG/2ML IJ SOLN
4.0000 mg | Freq: Once | INTRAMUSCULAR | Status: DC | PRN
Start: 2017-06-20 — End: 2017-06-20

## 2017-06-20 MED ORDER — FENTANYL CITRATE (PF) 100 MCG/2ML IJ SOLN
INTRAMUSCULAR | Status: DC | PRN
Start: 2017-06-20 — End: 2017-06-20
  Administered 2017-06-20: 50 ug via INTRAVENOUS
  Administered 2017-06-20: 100 ug via INTRAVENOUS

## 2017-06-20 MED ORDER — SUGAMMADEX SODIUM 500 MG/5ML IV SOLN
INTRAVENOUS | Status: AC
  Filled 2017-06-20: qty 5

## 2017-06-20 MED ORDER — PROPOFOL 10 MG/ML IV BOLUS
INTRAVENOUS | Status: DC | PRN
  Administered 2017-06-20: 130 mg via INTRAVENOUS

## 2017-06-20 MED ORDER — BUPIVACAINE HCL (PF) 0.5 % IJ SOLN
INTRAMUSCULAR | Status: AC
  Filled 2017-06-20: qty 30

## 2017-06-20 MED ORDER — SUCCINYLCHOLINE CHLORIDE 20 MG/ML IJ SOLN
INTRAMUSCULAR | Status: AC
  Filled 2017-06-20: qty 1

## 2017-06-20 MED ORDER — LACTATED RINGERS IV SOLN
INTRAVENOUS | Status: DC
  Administered 2017-06-20: 75 mL/h via INTRAVENOUS

## 2017-06-20 MED ORDER — EPHEDRINE SULFATE 50 MG/ML IJ SOLN
INTRAMUSCULAR | Status: DC | PRN
  Administered 2017-06-20: 10 mg via INTRAVENOUS

## 2017-06-20 MED ORDER — FENTANYL CITRATE (PF) 100 MCG/2ML IJ SOLN
INTRAMUSCULAR | Status: AC
  Filled 2017-06-20: qty 2

## 2017-06-20 MED ORDER — LIDOCAINE HCL (PF) 1 % IJ SOLN
INTRAMUSCULAR | Status: AC
  Filled 2017-06-20: qty 30

## 2017-06-20 MED ORDER — PHENYLEPHRINE HCL 10 MG/ML IJ SOLN
INTRAMUSCULAR | Status: AC
  Filled 2017-06-20: qty 1

## 2017-06-20 MED ORDER — EPHEDRINE SULFATE 50 MG/ML IJ SOLN
INTRAMUSCULAR | Status: AC
  Filled 2017-06-20: qty 1

## 2017-06-20 MED ORDER — ROCURONIUM BROMIDE 50 MG/5ML IV SOLN
INTRAVENOUS | Status: AC
  Filled 2017-06-20: qty 1

## 2017-06-20 MED ORDER — BUPIVACAINE HCL (PF) 0.5 % IJ SOLN
INTRAMUSCULAR | Status: DC | PRN
  Administered 2017-06-20: 30 mL

## 2017-06-20 MED ORDER — ROCURONIUM BROMIDE 50 MG/5ML IV SOLN
INTRAVENOUS | Status: AC
Start: 2017-06-20 — End: 2017-06-20
  Filled 2017-06-20: qty 1

## 2017-06-20 MED ORDER — DEXAMETHASONE SODIUM PHOSPHATE 10 MG/ML IJ SOLN
INTRAMUSCULAR | Status: AC
  Filled 2017-06-20: qty 1

## 2017-06-20 MED ORDER — ACETAMINOPHEN 10 MG/ML IV SOLN
INTRAVENOUS | Status: AC
  Filled 2017-06-20: qty 100

## 2017-06-20 MED ORDER — CEFAZOLIN SODIUM-DEXTROSE 2-4 GM/100ML-% IV SOLN
INTRAVENOUS | Status: AC
  Filled 2017-06-20: qty 100

## 2017-06-20 MED ORDER — OXYCODONE-ACETAMINOPHEN 5-325 MG PO TABS
1.0000 | ORAL_TABLET | Freq: Four times a day (QID) | ORAL | Status: DC | PRN
  Administered 2017-06-20: 1 via ORAL

## 2017-06-20 MED ORDER — GLYCOPYRROLATE 0.2 MG/ML IJ SOLN
INTRAMUSCULAR | Status: DC | PRN
  Administered 2017-06-20: 0.2 mg via INTRAVENOUS

## 2017-06-20 MED ORDER — CHLORHEXIDINE GLUCONATE CLOTH 2 % EX PADS: 6.0000 | MEDICATED_PAD | Freq: Once | CUTANEOUS | Status: DC

## 2017-06-20 MED ORDER — ROCURONIUM BROMIDE 100 MG/10ML IV SOLN
INTRAVENOUS | Status: DC | PRN
  Administered 2017-06-20: 20 mg via INTRAVENOUS
  Administered 2017-06-20: 30 mg via INTRAVENOUS
  Administered 2017-06-20: 20 mg via INTRAVENOUS

## 2017-06-20 MED ORDER — PROPOFOL 10 MG/ML IV BOLUS
INTRAVENOUS | Status: AC
  Filled 2017-06-20: qty 20

## 2017-06-20 SURGICAL SUPPLY — 34 items
ADH SKN CLS APL DERMABOND .7 (GAUZE/BANDAGES/DRESSINGS) ×2
BLADE SURG 15 STRL SS SAFETY (BLADE) ×3 IMPLANT
CANISTER SUCT 1200ML W/VALVE (MISCELLANEOUS) ×3 IMPLANT
CHLORAPREP W/TINT 26ML (MISCELLANEOUS) ×3 IMPLANT
DECANTER SPIKE VIAL GLASS SM (MISCELLANEOUS) ×6 IMPLANT
DERMABOND ADVANCED (GAUZE/BANDAGES/DRESSINGS) ×1
DERMABOND ADVANCED .7 DNX12 (GAUZE/BANDAGES/DRESSINGS) ×2 IMPLANT
DRAPE LAPAROTOMY 100X77 ABD (DRAPES) ×3 IMPLANT
ELECT REM PT RETURN 9FT ADLT (ELECTROSURGICAL) ×3
ELECTRODE REM PT RTRN 9FT ADLT (ELECTROSURGICAL) ×2 IMPLANT
GLOVE BIO SURGEON STRL SZ7 (GLOVE) ×12 IMPLANT
GLOVE BIOGEL PI IND STRL 6.5 (GLOVE) IMPLANT
GLOVE BIOGEL PI INDICATOR 6.5 (GLOVE) ×2
GLOVE SKINSENSE NS SZ6.5 (GLOVE) ×2
GLOVE SKINSENSE STRL SZ6.5 (GLOVE) IMPLANT
GOWN STRL REUS W/ TWL LRG LVL3 (GOWN DISPOSABLE) ×4 IMPLANT
GOWN STRL REUS W/TWL LRG LVL3 (GOWN DISPOSABLE) ×15
KIT RM TURNOVER STRD PROC AR (KITS) ×3 IMPLANT
LABEL OR SOLS (LABEL) ×3 IMPLANT
MESH VENTRALEX ST 8CM LRG (Mesh General) ×1 IMPLANT
NDL HYPO 25X1 1.5 SAFETY (NEEDLE) ×2 IMPLANT
NEEDLE HYPO 22GX1.5 SAFETY (NEEDLE) ×2 IMPLANT
NEEDLE HYPO 25X1 1.5 SAFETY (NEEDLE) ×3 IMPLANT
NS IRRIG 500ML POUR BTL (IV SOLUTION) ×3 IMPLANT
PACK BASIN MINOR ARMC (MISCELLANEOUS) ×3 IMPLANT
SUT PROLENE 0 CT 2 (SUTURE) ×6 IMPLANT
SUT VIC AB 2-0 CT2 27 (SUTURE) ×1 IMPLANT
SUT VIC AB 2-0 SH 27 (SUTURE) ×3
SUT VIC AB 2-0 SH 27XBRD (SUTURE) ×2 IMPLANT
SUT VIC AB 3-0 SH 27 (SUTURE) ×3
SUT VIC AB 3-0 SH 27X BRD (SUTURE) ×2 IMPLANT
SUT VIC AB 4-0 FS2 27 (SUTURE) ×4 IMPLANT
SUT VICRYL+ 3-0 144IN (SUTURE) ×3 IMPLANT
SYR CONTROL 10ML (SYRINGE) ×3 IMPLANT

## 2017-06-20 NOTE — OR Nursing (Signed)
Several large bruises noted on both arms upon arrival from prior blood draws.

## 2017-06-20 NOTE — Anesthesia Preprocedure Evaluation (Signed)
Anesthesia Evaluation  Patient identified by MRN, date of birth, ID band Patient awake    Reviewed: Allergy & Precautions, NPO status , Patient's Chart, lab work & pertinent test results  History of Anesthesia Complications (+) history of anesthetic complications (bradycardia during colonoscopy)  Airway Mallampati: III       Dental   Pulmonary neg pulmonary ROS,           Cardiovascular hypertension, Pt. on medications and Pt. on home beta blockers      Neuro/Psych    GI/Hepatic Neg liver ROS, GERD  Medicated and Controlled,  Endo/Other  negative endocrine ROSMorbid obesity  Renal/GU Renal diseasenegative Renal ROS     Musculoskeletal   Abdominal   Peds  Hematology   Anesthesia Other Findings   Reproductive/Obstetrics                             Anesthesia Physical Anesthesia Plan  ASA: III  Anesthesia Plan: General   Post-op Pain Management:    Induction:   PONV Risk Score and Plan: 4 or greater and Ondansetron, Dexamethasone, Propofol, Midazolam and Scopolamine patch - Pre-op  Airway Management Planned: Oral ETT  Additional Equipment:   Intra-op Plan:   Post-operative Plan:   Informed Consent: I have reviewed the patients History and Physical, chart, labs and discussed the procedure including the risks, benefits and alternatives for the proposed anesthesia with the patient or authorized representative who has indicated his/her understanding and acceptance.     Plan Discussed with:   Anesthesia Plan Comments:         Anesthesia Quick Evaluation

## 2017-06-20 NOTE — Interval H&P Note (Signed)
History and Physical Interval Note:  06/20/2017 9:15 AM  Miranda Aguilar  has presented today for surgery, with the diagnosis of umbilical hernia  The various methods of treatment have been discussed with the patient and family. After consideration of risks, benefits and other options for treatment, the patient has consented to  Procedure(s): HERNIA REPAIR UMBILICAL ADULT (N/A) as a surgical intervention .  The patient's history has been reviewed, patient examined, no change in status, stable for surgery.  I have reviewed the patient's chart and labs.  Questions were answered to the patient's satisfaction.     SANKAR,SEEPLAPUTHUR G

## 2017-06-20 NOTE — Op Note (Signed)
Preop diagnosis: Supraumbilical hernia  Post op diagnosis: Incisional hernia  Operation: Repair incisional hernia with the Ventralex ST mesh  Surgeon: Mckinley Jewel  Assistant:     Anesthesia: Gen.  Complications: None  EBL: Less than 10 mL  Drains: None   Description: Patient was put to sleep with endotracheal tube and the abdomen prepped and draped sterile field. Timeout was performed. Patient had a moderate sized umbilical hernia but she also had a prior incision that, encircle the right side of the umbilicus. Given the size of the hernia and the orientation was decided to do a transverse incision along the upper lip of the umbilicus extended on both sides for adequate exposure. After incision was made was deepened through to expose the hernial sac. Using a combination of sharp dissection cautery and blunt dissection the sac was freed and the umbilical skin was freed completely from the sac. Further dissection carried out to the level of the fascia. Patient had a transversely oriented incisional hernia measuring approximately 3-1/2-4 cm in transverse length. The sac was opened to reveal omentum with the loop of bowel that was lying right at the fascial level. Few adhesions of the omentum to the sac were freed and removed and pushed back in the peritoneal cavity. The peritoneal sac was then excised out. Lifting the fascial edge on both sides space was created in the preperitoneal area to allow placement of mesh in the preperitoneal space. This was done with the use of blunt and sharp dissection. Once this was done the peritoneum was closed with with interrupted 2-0 Vicryl stitches. An 8 cm Ventralex ST patch was then positioned in the preperitoneal space. The fascia was then closed with interrupted figure-of-eight stitches of 0 proline incorporating the anterior leaves of the mesh. Excess of the mesh was trimmed off. Repair was felt to be adequate with no tension or problems noted. Wound was  irrigated. Subcutaneous tissue was closed with interrupted 3-0 Vicryl. Skin was closed with subcuticular 4-0 Vicryl and covered with Dermabond. Patient tolerated the procedure well with no immediate problems encountered and she was then returned recovery room stable condition

## 2017-06-20 NOTE — Progress Notes (Signed)
Derma bond to abdomen dry and intact

## 2017-06-20 NOTE — Anesthesia Procedure Notes (Signed)
Procedure Name: Intubation Date/Time: 06/20/2017 10:08 AM Performed by: Jonna Clark Pre-anesthesia Checklist: Patient identified, Patient being monitored, Timeout performed, Emergency Drugs available and Suction available Patient Re-evaluated:Patient Re-evaluated prior to induction Oxygen Delivery Method: Circle system utilized Preoxygenation: Pre-oxygenation with 100% oxygen Induction Type: IV induction Ventilation: Mask ventilation without difficulty Laryngoscope Size: Mac and 3 Grade View: Grade I Tube type: Oral Tube size: 7.0 mm Number of attempts: 1 Placement Confirmation: ETT inserted through vocal cords under direct vision,  positive ETCO2 and breath sounds checked- equal and bilateral Secured at: 21 cm Tube secured with: Tape Dental Injury: Teeth and Oropharynx as per pre-operative assessment

## 2017-06-20 NOTE — Anesthesia Post-op Follow-up Note (Cosign Needed)
Anesthesia QCDR form completed.        

## 2017-06-20 NOTE — H&P (View-Only) (Signed)
Patient ID: Miranda Aguilar, female   DOB: Sep 17, 1961, 56 y.o.   MRN: [id number]  Chief Complaint  Patient presents with  . Pre-op Exam    HPI Miranda Aguilar is a 56 y.o. female.  Here today for preop exam, umbilical hernia surgery. She was seen last year for the hernia, surgery was recommended. She denies pain or other symptoms currently, says this summer is just a good time for her to have her surgery done. HPI  Past Medical History:  Diagnosis Date  . Chicken pox   . Constipation    Occasionally  . 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 GERD (gastroesophageal reflux disease)    Heartburn  . Hypertension    01/2015  . Obesity     Past Surgical History:  Procedure Laterality Date  . COLONOSCOPY N/A 05/22/2015   Procedure: COLONOSCOPY;  Surgeon: Danie Binder, MD;  Location: [facility];  Service: Endoscopy;  Laterality: N/A;  8:30 AM  . LAPAROTOMY N/A 02/01/2015   Procedure: EXPLORATORY LAPAROTOMY, MECKEL'S DIVERTICULECTOMY;  Surgeon: Jamesetta So, MD;  Location: [facility];  Service: General;  Laterality: N/A;  . TONSILLECTOMY    . WISDOM TOOTH EXTRACTION      Family History  Problem Relation Age of Onset  . Cancer Mother   . Diabetes Mother   . Hyperlipidemia Mother   . Heart disease Mother   . Cancer Father   . Stroke Father   . Diabetes Sister   . Osteoporosis Sister   . Diabetes Brother   . Breast cancer Paternal Aunt 93    Social History Social History  Substance Use Topics  . Smoking status: Never Smoker  . Smokeless tobacco: Never Used  . Alcohol use 3.0 oz/week    5 Glasses of wine per week    No Known Allergies  Current Outpatient Prescriptions  Medication Sig Dispense Refill  . cholecalciferol (VITAMIN D) 1000 units tablet Take 1,000 Units by mouth daily.    Marland Kitchen ibuprofen (ADVIL,MOTRIN) 200 MG tablet Take 400 mg by mouth every 8 (eight) hours as needed (pain).    . metoprolol succinate (TOPROL-XL) 50 MG 24 hr tablet take 1 tablet once daily with food 90  tablet 2  . omeprazole (PRILOSEC) 20 MG capsule take 1 capsule by mouth once daily 30 capsule 5   No current facility-administered medications for this visit.     Review of Systems Review of Systems  Constitutional: Negative.   Respiratory: Negative.   Cardiovascular: Negative.   Gastrointestinal: Negative for abdominal pain, nausea and vomiting.    Blood pressure 128/84, pulse 72, resp. rate 16, height 5\' 6"  (1.676 m), weight 278 lb (126.1 kg).  Physical Exam Physical Exam  Constitutional: She is oriented to person, place, and time. She appears well-developed and well-nourished.  Cardiovascular: Normal rate, regular rhythm and normal heart sounds.   Pulmonary/Chest: Effort normal and breath sounds normal.  Abdominal: Normal appearance and bowel sounds are normal. There is no hepatomegaly.    Lymphadenopathy:    She has no cervical adenopathy.  Neurological: She is alert and oriented to person, place, and time.  Skin: Skin is warm and dry.  Psychiatric: She has a normal mood and affect. Her behavior is normal.   Data Reviewed Prior notes reviewed.  Assessment    Umbilical hernia- approx 2 cm, containing fatty tissue, does not seem to contain bowel. Last colonoscopy in 2016- HR was an issue during procedure (bradycardia) Stable exam otherwise    Plan  Hernia repair recommended.  Procedure and risks /benefits explained Patient advised to call with any questions or concerns.      HPI, Physical Exam, Assessment and Plan have been scribed under the direction and in the presence of Mckinley Jewel, MD  Karie Fetch, RN  I have completed the exam and reviewed the above documentation for accuracy and completeness.  I agree with the above.  Haematologist has been used and any errors in dictation or transcription are unintentional.  Seeplaputhur G. Jamal Collin, M.D., F.A.C.S.  Junie Panning G 05/22/2017, 12:03 PM

## 2017-06-20 NOTE — Anesthesia Postprocedure Evaluation (Signed)
Anesthesia Post Note  Patient: Miranda Aguilar  Procedure(s) Performed: Procedure(s) (LRB): HERNIA REPAIR UMBILICAL ADULT (N/A) INSERTION OF MESH  Patient location during evaluation: PACU Anesthesia Type: General Level of consciousness: awake and alert Pain management: pain level controlled Vital Signs Assessment: post-procedure vital signs reviewed and stable Respiratory status: spontaneous breathing and respiratory function stable Cardiovascular status: stable Anesthetic complications: no     Last Vitals:  Vitals:   06/20/17 1232 06/20/17 1241  BP: 114/62 114/69  Pulse: 69 72  Resp: 17 16  Temp: 36.6 C (!) 36.3 C    Last Pain:  Vitals:   06/20/17 1241  TempSrc: Temporal  PainSc: 3                  KEPHART,WILLIAM K

## 2017-06-20 NOTE — Transfer of Care (Signed)
Immediate Anesthesia Transfer of Care Note  Patient: Miranda Aguilar  Procedure(s) Performed: Procedure(s) with comments: HERNIA REPAIR UMBILICAL ADULT (N/A) INSERTION OF MESH - umbilical   Patient Location: PACU  Anesthesia Type:General  Level of Consciousness: awake, alert  and oriented  Airway & Oxygen Therapy: Patient Spontanous Breathing and Patient connected to face mask oxygen  Post-op Assessment: Report given to RN and Post -op Vital signs reviewed and stable  Post vital signs: Reviewed and stable  Last Vitals:  Vitals:   06/20/17 0858 06/20/17 1203  BP: (!) 172/92 (!) 93/39  Pulse: 79 77  Resp: 15 18  Temp: 36.8 C (!) 36.4 C    Last Pain:  Vitals:   06/20/17 0858  TempSrc: Oral      Patients Stated Pain Goal: 0 (15/83/09 4076)  Complications: No apparent anesthesia complications

## 2017-06-22 ENCOUNTER — Encounter: Payer: Self-pay | Admitting: General Surgery

## 2017-06-23 LAB — SURGICAL PATHOLOGY

## 2017-06-30 ENCOUNTER — Encounter: Payer: Self-pay | Admitting: General Surgery

## 2017-06-30 ENCOUNTER — Ambulatory Visit (INDEPENDENT_AMBULATORY_CARE_PROVIDER_SITE_OTHER): Payer: BLUE CROSS/BLUE SHIELD | Admitting: General Surgery

## 2017-06-30 VITALS — BP 132/70 | HR 74 | Resp 14 | Ht 66.0 in | Wt 281.0 lb

## 2017-06-30 DIAGNOSIS — K432 Incisional hernia without obstruction or gangrene: Secondary | ICD-10-CM

## 2017-06-30 NOTE — Progress Notes (Signed)
Patient ID: Miranda Aguilar, female   DOB: 1961-01-27, 56 y.o.   MRN: [id number]  Chief Complaint  Patient presents with  . Routine Post Op    umbilical hernia    HPI Miranda Aguilar is a 56 y.o. female is here today for incisional  hernia repair done on 06/20/17. Patient is doing well, did not need pain medication. She took only one dose. Moving bowels and urinating regularly. No new complaints.  HPI  Past Medical History:  Diagnosis Date  . Chicken pox   . Complication of anesthesia    bradycardia (low 40's)during colonoscopy  . Constipation    Occasionally  . Gallstone ileus (HCC)   . GERD (gastroesophageal reflux disease)    Heartburn  . Hypertension    01/2015  . Obesity     Past Surgical History:  Procedure Laterality Date  . COLONOSCOPY N/A 05/22/2015   Procedure: COLONOSCOPY;  Surgeon: West Bali, MD;  [facility];  Service: Endoscopy;  Laterality: N/A;  8:30 AM  . INSERTION OF MESH  06/20/2017   Procedure: INSERTION OF MESH;  Surgeon: Kieth Brightly, MD;  Location: [facility];  Service: General;;  umbilical   . LAPAROTOMY N/A 02/01/2015   Procedure: EXPLORATORY LAPAROTOMY, MECKEL'S DIVERTICULECTOMY;  Surgeon: Dalia Heading, MD;  Location: [facility];  Service: General;  Laterality: N/A;  . TONSILLECTOMY    . UMBILICAL HERNIA REPAIR N/A 06/20/2017   Procedure: HERNIA REPAIR UMBILICAL ADULT;  Surgeon: Kieth Brightly, MD;  Location: [facility];  Service: General;  Laterality: N/A;  . WISDOM TOOTH EXTRACTION      Family History  Problem Relation Age of Onset  . Cancer Mother   . Diabetes Mother   . Hyperlipidemia Mother   . Heart disease Mother   . Cancer Father   . Stroke Father   . Diabetes Sister   . Osteoporosis Sister   . Diabetes Brother   . Breast cancer Paternal Aunt 72    Social History Social History  Substance Use Topics  . Smoking status: Never Smoker  . Smokeless tobacco: Never Used  . Alcohol use 3.0 oz/week    5  Glasses of wine per week     Comment: 5-6 glasses a week    No Known Allergies  Current Outpatient Prescriptions  Medication Sig Dispense Refill  . Cholecalciferol (VITAMIN D) 2000 units CAPS Take 2,000 Units by mouth daily.    Marland Kitchen ibuprofen (ADVIL,MOTRIN) 200 MG tablet Take 400 mg by mouth every 8 (eight) hours as needed for mild pain or moderate pain.     . metoprolol succinate (TOPROL-XL) 50 MG 24 hr tablet take 1 tablet once daily with food 90 tablet 2  . omeprazole (PRILOSEC) 20 MG capsule take 1 capsule by mouth once daily 30 capsule 5   No current facility-administered medications for this visit.     Review of Systems Review of Systems  Constitutional: Negative.   Respiratory: Negative.   Cardiovascular: Negative.     Blood pressure 132/70, pulse 74, resp. rate 14, height 5\' 6"  (1.676 m), weight 281 lb (127.5 kg).  Physical Exam Physical Exam  Constitutional: She is oriented to person, place, and time. She appears well-developed and well-nourished.  Abdominal: Soft. Bowel sounds are normal. There is no tenderness. No hernia.    Neurological: She is alert and oriented to person, place, and time.  Skin: Skin is warm and dry.    Data Reviewed Operative report  Assessment  Incisional  hernia repair with Ventralex ST  8cm mesh. Doing well post op    Plan    Return in one month.   Patient may resume activities as tolerated, taking care to use proper technique when lifting (demonstrated).    HPI, Physical Exam, Assessment and Plan have been scribed under the direction and in the presence of Kathreen Cosier, MD.  Milas Kocher, CMA  I have completed the exam and reviewed the above documentation for accuracy and completeness.  I agree with the above.    Schuyler Olden G. Evette Cristal, M.D., F.A.C.S.  Gerlene Burdock G 06/30/2017, 10:01 AM

## 2017-06-30 NOTE — Patient Instructions (Addendum)
Return in one month. Increase activity as tolerated.

## 2017-07-08 ENCOUNTER — Ambulatory Visit (INDEPENDENT_AMBULATORY_CARE_PROVIDER_SITE_OTHER): Payer: BLUE CROSS/BLUE SHIELD | Admitting: General Surgery

## 2017-07-08 ENCOUNTER — Encounter: Payer: Self-pay | Admitting: General Surgery

## 2017-07-08 VITALS — BP 152/82 | HR 78 | Resp 12 | Ht 66.0 in | Wt 280.0 lb

## 2017-07-08 DIAGNOSIS — K432 Incisional hernia without obstruction or gangrene: Secondary | ICD-10-CM

## 2017-07-08 NOTE — Patient Instructions (Signed)
Patient to return as scheduled.  

## 2017-07-08 NOTE — Progress Notes (Signed)
Patient ID: Miranda Aguilar, female   DOB: August 28, 1961, 56 y.o.   MRN: [id number]  Chief Complaint  Patient presents with  . Follow-up    HPI MARIONA Aguilar is a 56 y.o. female  is here today for incisional  hernia repair done on 06/20/17. Patient states the area is red and draining.   HPI  Past Medical History:  Diagnosis Date  . Chicken pox   . Complication of anesthesia    bradycardia (low 40's)during colonoscopy  . Constipation    Occasionally  . Gallstone ileus (HCC)   . GERD (gastroesophageal reflux disease)    Heartburn  . Hypertension    01/2015  . Obesity     Past Surgical History:  Procedure Laterality Date  . COLONOSCOPY N/A 05/22/2015   Procedure: COLONOSCOPY;  Surgeon: West Bali, MD;  [facility];  Service: Endoscopy;  Laterality: N/A;  8:30 AM  . INSERTION OF MESH  06/20/2017   Procedure: INSERTION OF MESH;  Surgeon: Kieth Brightly, MD;  Location: [facility];  Service: General;;  umbilical   . LAPAROTOMY N/A 02/01/2015   Procedure: EXPLORATORY LAPAROTOMY, MECKEL'S DIVERTICULECTOMY;  Surgeon: Dalia Heading, MD;  Location: [facility];  Service: General;  Laterality: N/A;  . TONSILLECTOMY    . UMBILICAL HERNIA REPAIR N/A 06/20/2017   Procedure: HERNIA REPAIR UMBILICAL ADULT;  Surgeon: Kieth Brightly, MD;  Location: [facility];  Service: General;  Laterality: N/A;  . WISDOM TOOTH EXTRACTION      Family History  Problem Relation Age of Onset  . Cancer Mother   . Diabetes Mother   . Hyperlipidemia Mother   . Heart disease Mother   . Cancer Father   . Stroke Father   . Diabetes Sister   . Osteoporosis Sister   . Diabetes Brother   . Breast cancer Paternal Aunt 63    Social History Social History  Substance Use Topics  . Smoking status: Never Smoker  . Smokeless tobacco: Never Used  . Alcohol use 3.0 oz/week    5 Glasses of wine per week     Comment: 5-6 glasses a week    No Known Allergies  Current Outpatient  Prescriptions  Medication Sig Dispense Refill  . Cholecalciferol (VITAMIN D) 2000 units CAPS Take 2,000 Units by mouth daily.    Marland Kitchen ibuprofen (ADVIL,MOTRIN) 200 MG tablet Take 400 mg by mouth every 8 (eight) hours as needed for mild pain or moderate pain.     . metoprolol succinate (TOPROL-XL) 50 MG 24 hr tablet take 1 tablet once daily with food 90 tablet 2  . omeprazole (PRILOSEC) 20 MG capsule take 1 capsule by mouth once daily 30 capsule 5   No current facility-administered medications for this visit.     Review of Systems Review of Systems  Constitutional: Negative.   Respiratory: Negative.   Cardiovascular: Negative.     Blood pressure (!) 152/82, pulse 78, resp. rate 12, height 5\' 6"  (1.676 m), weight 280 lb (127 kg).  Physical Exam Physical Exam  Constitutional: She is oriented to person, place, and time. She appears well-developed and well-nourished.  Abdominal: Soft. There is no tenderness.  Neurological: She is alert and oriented to person, place, and time.  Skin: Skin is warm and dry.  Small area of skin burn noted in middle of incision. No signs of infection.   Data Reviewed Op note  Assessment    Pt advised and reassured.    Plan   Antibiotic  oint to incision daily Patient to return as scheduled.   HPI, Physical Exam, Assessment and Plan have been scribed under the direction and in the presence of Kathreen Cosier, MD  Ples Specter, CMA    I have completed the exam and reviewed the above documentation for accuracy and completeness.  I agree with the above.  Museum/gallery conservator has been used and any errors in dictation or transcription are unintentional.  Jovi Zavadil G. Evette Cristal, M.D., F.A.C.S.     Gerlene Burdock G 07/10/2017, 7:52 AM

## 2017-07-24 ENCOUNTER — Other Ambulatory Visit: Payer: Self-pay | Admitting: Family

## 2017-08-05 ENCOUNTER — Encounter: Payer: Self-pay | Admitting: General Surgery

## 2017-08-05 ENCOUNTER — Ambulatory Visit (INDEPENDENT_AMBULATORY_CARE_PROVIDER_SITE_OTHER): Payer: BLUE CROSS/BLUE SHIELD | Admitting: General Surgery

## 2017-08-05 VITALS — BP 154/80 | HR 96 | Resp 16 | Ht 66.0 in | Wt 291.0 lb

## 2017-08-05 DIAGNOSIS — K429 Umbilical hernia without obstruction or gangrene: Secondary | ICD-10-CM

## 2017-08-05 NOTE — Progress Notes (Signed)
Patient ID: Miranda Aguilar, female   DOB: 07-31-1961, 56 y.o.   MRN: [id number]  Chief Complaint  Patient presents with  . Routine Post Op    HPI Miranda Aguilar is a 56 y.o. female.  Here today for postoperative visit, umbilical/incisional hernia repair with Ventralex ST mesh on 06-20-17, she states she is doing well. Denies any gastrointestinal issues, bowels are moving regular.  HPI  Past Medical History:  Diagnosis Date  . Chicken pox   . Complication of anesthesia    bradycardia (low 40's)during colonoscopy  . Constipation    Occasionally  . Gallstone ileus (HCC)   . GERD (gastroesophageal reflux disease)    Heartburn  . Hypertension    01/2015  . Obesity     Past Surgical History:  Procedure Laterality Date  . COLONOSCOPY N/A 05/22/2015   Procedure: COLONOSCOPY;  Surgeon: West Bali, MD;  [facility];  Service: Endoscopy;  Laterality: N/A;  8:30 AM  . INSERTION OF MESH  06/20/2017   Procedure: INSERTION OF MESH;  Surgeon: Kieth Brightly, MD;  Location: [facility];  Service: General;;  umbilical   . LAPAROTOMY N/A 02/01/2015   Procedure: EXPLORATORY LAPAROTOMY, MECKEL'S DIVERTICULECTOMY;  Surgeon: Dalia Heading, MD;  Location: [facility];  Service: General;  Laterality: N/A;  . TONSILLECTOMY    . UMBILICAL HERNIA REPAIR N/A 06/20/2017   Procedure: HERNIA REPAIR UMBILICAL ADULT;  Surgeon: Kieth Brightly, MD;  Location: [facility];  Service: General;  Laterality: N/A;  . WISDOM TOOTH EXTRACTION      Family History  Problem Relation Age of Onset  . Cancer Mother   . Diabetes Mother   . Hyperlipidemia Mother   . Heart disease Mother   . Cancer Father   . Stroke Father   . Diabetes Sister   . Osteoporosis Sister   . Diabetes Brother   . Breast cancer Paternal Aunt 66    Social History Social History  Substance Use Topics  . Smoking status: Never Smoker  . Smokeless tobacco: Never Used  . Alcohol use 3.0 oz/week    5 Glasses of wine  per week     Comment: 5-6 glasses a week    No Known Allergies  Current Outpatient Prescriptions  Medication Sig Dispense Refill  . Cholecalciferol (VITAMIN D) 2000 units CAPS Take 2,000 Units by mouth daily.    Marland Kitchen ibuprofen (ADVIL,MOTRIN) 200 MG tablet Take 400 mg by mouth every 8 (eight) hours as needed for mild pain or moderate pain.     . metoprolol succinate (TOPROL-XL) 50 MG 24 hr tablet take 1 tablet once daily with food 90 tablet 2  . omeprazole (PRILOSEC) 20 MG capsule take 1 capsule once daily 30 capsule 5   No current facility-administered medications for this visit.     Review of Systems Review of Systems  Constitutional: Negative.   Respiratory: Negative.   Cardiovascular: Negative.     Blood pressure (!) 154/80, pulse 96, resp. rate 16, height 5\' 6"  (1.676 m), weight 291 lb (132 kg), last menstrual period 08/05/2017.  Physical Exam Physical Exam  Constitutional: She is oriented to person, place, and time. She appears well-developed and well-nourished.  Abdominal:    Neurological: She is alert and oriented to person, place, and time.  Skin: Skin is warm and dry.  Psychiatric: Her behavior is normal.    Data Reviewed Progress notes, Op note  Assessment    Stable exam, Hernia repair intact.  Plan       Patient to return as needed. The patient is aware to call back for any questions or concerns.    HPI, Physical Exam, Assessment and Plan have been scribed under the direction and in the presence of Kathreen Cosier, MD Dorathy Daft, RN I have completed the exam and reviewed the above documentation for accuracy and completeness.  I agree with the above.  Museum/gallery conservator has been used and any errors in dictation or transcription are unintentional.  Gearld Kerstein G. Evette Cristal, M.D., F.A.C.S.  Gerlene Burdock G 08/05/2017, 9:43 AM

## 2017-08-05 NOTE — Patient Instructions (Addendum)
Return as needed

## 2017-10-24 ENCOUNTER — Ambulatory Visit: Payer: BLUE CROSS/BLUE SHIELD | Admitting: Adult Health

## 2017-12-09 DIAGNOSIS — C4491 Basal cell carcinoma of skin, unspecified: Secondary | ICD-10-CM

## 2017-12-09 HISTORY — DX: Basal cell carcinoma of skin, unspecified: C44.91

## 2018-01-28 ENCOUNTER — Telehealth: Payer: Self-pay | Admitting: Family

## 2018-01-28 MED ORDER — METOPROLOL SUCCINATE ER 50 MG PO TB24: ORAL_TABLET | ORAL | 0 refills | Status: DC

## 2018-01-28 NOTE — Telephone Encounter (Signed)
Copied from [place] [id number]. Topic: Quick Communication - Rx Refill/Question >> Jan 28, 2018 12:18 PM Oliver Pila B wrote: Medication: metoprolol succinate (TOPROL-XL) 50 MG 24 hr tablet [[id number]]    Has the patient contacted their pharmacy? Yes.     (Agent: If no, request that the patient contact the pharmacy for the refill.)   Preferred Pharmacy (with phone number or street name): [facility]   Agent: Please be advised that RX refills may take up to 3 business days. We ask that you follow-up with your pharmacy.

## 2018-01-28 NOTE — Telephone Encounter (Signed)
Script sent , left message to schedule appointment

## 2018-01-28 NOTE — Telephone Encounter (Signed)
Please advise 

## 2018-03-01 ENCOUNTER — Other Ambulatory Visit: Payer: Self-pay | Admitting: Family

## 2018-03-02 ENCOUNTER — Ambulatory Visit: Payer: BLUE CROSS/BLUE SHIELD | Admitting: Family

## 2018-03-02 ENCOUNTER — Encounter: Payer: Self-pay | Admitting: Family

## 2018-03-02 VITALS — BP 138/80 | HR 55 | Temp 98.0°F | Resp 20 | Ht 66.0 in | Wt 297.1 lb

## 2018-03-02 DIAGNOSIS — I10 Essential (primary) hypertension: Secondary | ICD-10-CM | POA: Diagnosis not present

## 2018-03-02 DIAGNOSIS — L989 Disorder of the skin and subcutaneous tissue, unspecified: Secondary | ICD-10-CM | POA: Diagnosis not present

## 2018-03-02 LAB — LIPID PANEL
CHOLESTEROL: 178 mg/dL (ref 0–200)
HDL: 61.6 mg/dL (ref >39.00)
LDL Cholesterol: 98 mg/dL (ref 0–99)
NonHDL: 116.1
Total CHOL/HDL Ratio: 3
Triglycerides: 90 mg/dL (ref 0.0–149.0)
VLDL: 18 mg/dL (ref 0.0–40.0)

## 2018-03-02 LAB — CBC WITH DIFFERENTIAL/PLATELET
BASOS PCT: 0.9 % (ref 0.0–3.0)
Basophils Absolute: 0 10*3/uL (ref 0.0–0.1)
EOS ABS: 0.2 10*3/uL (ref 0.0–0.7)
Eosinophils Relative: 5.1 % — ABNORMAL HIGH (ref 0.0–5.0)
HEMATOCRIT: 41.2 % (ref 36.0–46.0)
Hemoglobin: 14.2 g/dL (ref 12.0–15.0)
LYMPHS ABS: 1.5 10*3/uL (ref 0.7–4.0)
Lymphocytes Relative: 39.1 % (ref 12.0–46.0)
MCHC: 34.4 g/dL (ref 30.0–36.0)
MCV: 91 fl (ref 78.0–100.0)
MONO ABS: 0.4 10*3/uL (ref 0.1–1.0)
Monocytes Relative: 9.6 % (ref 3.0–12.0)
Neutro Abs: 1.7 10*3/uL (ref 1.4–7.7)
Neutrophils Relative %: 45.3 % (ref 43.0–77.0)
PLATELETS: 213 10*3/uL (ref 150.0–400.0)
RBC: 4.52 Mil/uL (ref 3.87–5.11)
RDW: 13.5 % (ref 11.5–15.5)
WBC: 3.8 10*3/uL — ABNORMAL LOW (ref 4.0–10.5)

## 2018-03-02 LAB — COMPREHENSIVE METABOLIC PANEL
ALT: 25 U/L (ref 0–35)
AST: 21 U/L (ref 0–37)
Albumin: 3.8 g/dL (ref 3.5–5.2)
Alkaline Phosphatase: 53 U/L (ref 39–117)
BUN: 14 mg/dL (ref 6–23)
CALCIUM: 9.1 mg/dL (ref 8.4–10.5)
CO2: 27 mEq/L (ref 19–32)
CREATININE: 0.6 mg/dL (ref 0.40–1.20)
Chloride: 104 mEq/L (ref 96–112)
GFR: 109.58 mL/min (ref >60.00)
Glucose, Bld: 119 mg/dL — ABNORMAL HIGH (ref 70–99)
Potassium: 4.5 mEq/L (ref 3.5–5.1)
SODIUM: 139 meq/L (ref 135–145)
Total Bilirubin: 0.6 mg/dL (ref 0.2–1.2)
Total Protein: 7.3 g/dL (ref 6.0–8.3)

## 2018-03-02 LAB — VITAMIN D 25 HYDROXY (VIT D DEFICIENCY, FRACTURES): VITD: 31.86 ng/mL (ref 30.00–100.00)

## 2018-03-02 LAB — TSH: TSH: 1.62 u[IU]/mL (ref 0.35–4.50)

## 2018-03-02 LAB — HEMOGLOBIN A1C: Hgb A1c MFr Bld: 5.9 % (ref 4.6–6.5)

## 2018-03-02 NOTE — Patient Instructions (Signed)
Great seeing you!  Please call your dermatologist as discussed based on the location of your lesion ; nose being sun exposed area

## 2018-03-02 NOTE — Assessment & Plan Note (Signed)
At goal. Continue current regimen. 

## 2018-03-02 NOTE — Progress Notes (Signed)
Subjective:    Patient ID: Miranda Aguilar, female    DOB: 06/27/1961, 57 y.o.   MRN: [id number]  CC: Miranda Aguilar is a 57 y.o. female who presents today for follow up.   HPI: Had hernia repair with mesh, doing well.   Notes lesion on nose, comes and goes. Not there today. Describes as scaly place on end of nose. A little tender like pimple. Doesn't come to head or drain.  Non bleeding, doesn't itch. No h.o skin cancer.   GERD- has weaned off the prilosec. Tums works well for PRN.   HTN- compliant with medication. 120/80 at home. Denies exertional chest pain or pressure, numbness or tingling radiating to left arm or jaw, palpitations, dizziness, frequent headaches, changes in vision, or shortness of breath.       Saw GYN in 2017 regarding abnormal pap. No f/u required per patient.   HISTORY:  Past Medical History:  Diagnosis Date  . Chicken pox   . Complication of anesthesia    bradycardia (low 40's)during colonoscopy  . Constipation    Occasionally  . Gallstone ileus ([place])   . GERD (gastroesophageal reflux disease)    Heartburn  . Hypertension    01/2015  . Obesity    Past Surgical History:  Procedure Laterality Date  . COLONOSCOPY N/A 05/22/2015   Procedure: COLONOSCOPY;  Surgeon: Danie Binder, MD;  [facility];  Service: Endoscopy;  Laterality: N/A;  8:30 AM  . INSERTION OF MESH  06/20/2017   Procedure: INSERTION OF MESH;  Surgeon: Christene Lye, MD;  Location: [facility];  Service: General;;  umbilical   . LAPAROTOMY N/A 02/01/2015   Procedure: EXPLORATORY LAPAROTOMY, MECKEL'S DIVERTICULECTOMY;  Surgeon: Jamesetta So, MD;  Location: [facility];  Service: General;  Laterality: N/A;  . TONSILLECTOMY    . UMBILICAL HERNIA REPAIR N/A 06/20/2017   Procedure: HERNIA REPAIR UMBILICAL ADULT;  Surgeon: Christene Lye, MD;  Location: [facility];  Service: General;  Laterality: N/A;  . WISDOM TOOTH EXTRACTION     Family History  Problem Relation  Age of Onset  . Cancer Mother   . Diabetes Mother   . Hyperlipidemia Mother   . Heart disease Mother        CHF  . Cancer Father   . Stroke Father   . Diabetes Sister   . Osteoporosis Sister   . Diabetes Brother   . Breast cancer Paternal Aunt 93    Allergies: Patient has no known allergies. Current Outpatient Medications on File Prior to Visit  Medication Sig Dispense Refill  . Cholecalciferol (VITAMIN D) 2000 units CAPS Take 2,000 Units by mouth daily.    Marland Kitchen ibuprofen (ADVIL,MOTRIN) 200 MG tablet Take 400 mg by mouth every 8 (eight) hours as needed for mild pain or moderate pain.     . metoprolol succinate (TOPROL-XL) 50 MG 24 hr tablet TAKE 1 TABLET BY MOUTH EVERY DAY WITH FOOD 30 tablet 2   No current facility-administered medications on file prior to visit.     Social History   Tobacco Use  . Smoking status: Never Smoker  . Smokeless tobacco: Never Used  Substance Use Topics  . Alcohol use: Yes    Alcohol/week: 3.0 oz    Types: 5 Glasses of wine per week    Comment: 5-6 glasses a week  . Drug use: No    Review of Systems  Constitutional: Negative for chills and fever.  Respiratory: Negative for cough.  Cardiovascular: Negative for chest pain and palpitations.  Gastrointestinal: Negative for nausea and vomiting.  Skin: Negative for color change and wound.      Objective:    BP 138/80 (BP Location: Left Arm, Patient Position: Sitting, Cuff Size: Large)   Pulse (!) 55   Temp 98 F (36.7 C) (Oral)   Resp 20   Ht 5\' 6"  (1.676 m)   Wt 297 lb 2 oz (134.8 kg)   LMP 12/16/2017 (Approximate)   SpO2 94%   BMI 47.96 kg/m  BP Readings from Last 3 Encounters:  03/02/18 138/80  08/05/17 (!) 154/80  07/08/17 (!) 152/82   Wt Readings from Last 3 Encounters:  03/02/18 297 lb 2 oz (134.8 kg)  08/05/17 291 lb (132 kg)  07/08/17 280 lb (127 kg)    Physical Exam  Constitutional: She appears well-developed and well-nourished.  HENT:  NO lesion appreciated today  on nose.   Eyes: Conjunctivae are normal.  Cardiovascular: Normal rate, regular rhythm, normal heart sounds and normal pulses.  Pulmonary/Chest: Effort normal and breath sounds normal. She has no wheezes. She has no rhonchi. She has no rales.  Neurological: She is alert.  Skin: Skin is warm and dry.  Psychiatric: She has a normal mood and affect. Her speech is normal and behavior is normal. Thought content normal.  Vitals reviewed.      Assessment & Plan:   Problem List Items Addressed This Visit      Cardiovascular and Mediastinum   Essential hypertension - Primary    At goal. Continue current regimen.       Relevant Orders   CBC with Differential/Platelet   Comprehensive metabolic panel   Hemoglobin A1c   Lipid panel   TSH   VITAMIN D 25 Hydroxy (Vit-D Deficiency, Fractures)     Musculoskeletal and Integument   Skin lesion    Agreed to see dermatology as sun exposed area. Declines referral as she already has dermatologist and states she will make an appointment herself.           I have discontinued Miranda Aguilar's omeprazole. I am also having her maintain her ibuprofen and Vitamin D.   No orders of the defined types were placed in this encounter.   Return precautions given.   Risks, benefits, and alternatives of the medications and treatment plan prescribed today were discussed, and patient expressed understanding.   Education regarding symptom management and diagnosis given to patient on AVS.  Continue to follow with Miranda Hawthorne, Miranda Aguilar for routine health maintenance.   Miranda Aguilar and I agreed with plan.   Miranda Paris, Miranda Aguilar

## 2018-03-02 NOTE — Assessment & Plan Note (Signed)
Agreed to see dermatology as sun exposed area. Declines referral as she already has dermatologist and states she will make an appointment herself.

## 2018-03-02 NOTE — Progress Notes (Signed)
Pre-visit discussion using our clinic review tool. No additional management support is needed unless otherwise documented below in the visit note.  

## 2018-04-25 ENCOUNTER — Other Ambulatory Visit: Payer: Self-pay | Admitting: Family

## 2018-08-24 ENCOUNTER — Telehealth: Payer: Self-pay

## 2018-08-24 DIAGNOSIS — Z1231 Encounter for screening mammogram for malignant neoplasm of breast: Secondary | ICD-10-CM

## 2018-08-24 NOTE — Addendum Note (Signed)
Addended by: Geanie Cooley C on: 08/24/2018 02:00 PM   Modules accepted: Orders

## 2018-08-24 NOTE — Telephone Encounter (Addendum)
Left voice mail for patient to call back ok for [facility] to speak to patient   Order placed for mammogram , ok for patient to call and schedule

## 2018-08-24 NOTE — Telephone Encounter (Signed)
Copied from [place] [id number]. Topic: Referral - Request >> Aug 24, 2018 10:02 AM Miranda Aguilar wrote: Reason for CRM: pt is requesting a referral for her mammogram; contact to advise

## 2018-08-27 ENCOUNTER — Ambulatory Visit: Payer: BLUE CROSS/BLUE SHIELD

## 2018-08-27 NOTE — Telephone Encounter (Signed)
Left voice mail for patient to call back ok for P[facility] to speak to patient    

## 2018-08-31 ENCOUNTER — Ambulatory Visit
Admission: RE | Admit: 2018-08-31 | Discharge: 2018-08-31 | Disposition: A | Discharge status: Home / Self Care | Payer: BLUE CROSS/BLUE SHIELD | Source: Ambulatory Visit | Attending: Family | Admitting: Family

## 2018-08-31 DIAGNOSIS — Z1231 Encounter for screening mammogram for malignant neoplasm of breast: Secondary | ICD-10-CM | POA: Diagnosis not present

## 2018-09-01 NOTE — Telephone Encounter (Signed)
Mammogram done 08/31/18

## 2018-09-26 ENCOUNTER — Other Ambulatory Visit: Payer: Self-pay | Admitting: Family

## 2018-10-25 ENCOUNTER — Other Ambulatory Visit: Payer: Self-pay | Admitting: Family

## 2018-11-22 ENCOUNTER — Other Ambulatory Visit: Payer: Self-pay | Admitting: Family

## 2018-12-25 ENCOUNTER — Other Ambulatory Visit: Payer: Self-pay | Admitting: Family

## 2019-01-04 ENCOUNTER — Ambulatory Visit: Payer: BLUE CROSS/BLUE SHIELD | Admitting: Family

## 2019-01-04 ENCOUNTER — Encounter: Payer: Self-pay | Admitting: Family

## 2019-01-04 ENCOUNTER — Other Ambulatory Visit: Payer: Self-pay

## 2019-01-04 DIAGNOSIS — R1011 Right upper quadrant pain: Secondary | ICD-10-CM | POA: Diagnosis not present

## 2019-01-04 DIAGNOSIS — R109 Unspecified abdominal pain: Secondary | ICD-10-CM | POA: Diagnosis not present

## 2019-01-04 MED ORDER — METOPROLOL SUCCINATE ER 50 MG PO TB24: 50.0000 mg | ORAL_TABLET | Freq: Every day | ORAL | 1 refills | Status: DC

## 2019-01-04 NOTE — Patient Instructions (Signed)
Labs today, please be extra vigilant until we have your CT abdomen pelvis done.  We will be in close touch

## 2019-01-04 NOTE — Progress Notes (Signed)
Subjective:    Patient ID: Miranda Aguilar, female    DOB: 06-29-1961, 58 y.o.   MRN: [id number]  CC: [name] is a 58 y.o. female who presents today for follow up.   HPI: CC: upper adbominal pain,5 days ago,  Waxes and waned. Today feels better.  Pain is worse with walking on hard service, 'almost like a bruise.' 'feels like gallbladder attack.'   Describes 5 days ago initial episode when she was eating chicken noodle soup; pain started  middle , upper adbominal pain, describes as sharp and then felt more like cramping. Lasted 4 hours and then resolved on its own. 'ebbs and flows when it starts.' Had epiigastric burning,  Nausea and vomited at that time (approximately 3-4 episodes non bloody emesis). Had chills, increase flatus; all symptoms resolved 4 hours later.  Pain improved with heating pad and ibuprofen No cp, sob,fever  The following day had episodes of non bloody diarrhea, since resolved. Regular bowel movements now.    GERD- had been on prilosec in the past; hasnt needed over past year since weaned off. No trouble swallowing.     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 S/p exploratory lap Hernia repair 06/2017 Colonoscopy up-to-date, 2016, diverticulosis noted.  HISTORY:  Past Medical History:  Diagnosis Date  . Chicken pox   . Complication of anesthesia    bradycardia (low 40's)during colonoscopy  . Constipation    Occasionally  . 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 GERD (gastroesophageal reflux disease)    Heartburn  . Hypertension    01/2015  . Obesity    Past Surgical History:  Procedure Laterality Date  . COLONOSCOPY N/A 05/22/2015   Procedure: COLONOSCOPY;  Surgeon: Danie Binder, MD;  [facility];  Service: Endoscopy;  Laterality: N/A;  8:30 AM  . INSERTION OF MESH  06/20/2017   Procedure: INSERTION OF MESH;  Surgeon: Christene Lye, MD;  Location: [facility];  Service: General;;  umbilical   . LAPAROTOMY N/A 02/01/2015   Procedure: EXPLORATORY  LAPAROTOMY, MECKEL'S DIVERTICULECTOMY;  Surgeon: Jamesetta So, MD;  Location: [facility];  Service: General;  Laterality: N/A;  . TONSILLECTOMY    . UMBILICAL HERNIA REPAIR N/A 06/20/2017   Procedure: HERNIA REPAIR UMBILICAL ADULT;  Surgeon: Christene Lye, MD;  Location: [facility] ORS;  Service: General;  Laterality: N/A;  . WISDOM TOOTH EXTRACTION     Family History  Problem Relation Age of Onset  . Cancer Mother   . Diabetes Mother   . Hyperlipidemia Mother   . Heart disease Mother        CHF  . Cancer Father   . Stroke Father   . Diabetes Sister   . Osteoporosis Sister   . Diabetes Brother   . Breast cancer Paternal Aunt 93    Allergies: Patient has no known allergies. Current Outpatient Medications on File Prior to Visit  Medication Sig Dispense Refill  . Cholecalciferol (VITAMIN D) 2000 units CAPS Take 2,000 Units by mouth daily.    Marland Kitchen ibuprofen (ADVIL,MOTRIN) 200 MG tablet Take 400 mg by mouth every 8 (eight) hours as needed for mild pain or moderate pain.      No current facility-administered medications on file prior to visit.     Social History   Tobacco Use  . Smoking status: Never Smoker  . Smokeless tobacco: Never Used  Substance Use Topics  . Alcohol use: Yes    Alcohol/week: 5.0 standard drinks    Types: 5 Glasses of  wine per week    Comment: 5-6 glasses a week  . Drug use: No    Review of Systems  Constitutional: Positive for chills (resolved). Negative for fever.  Respiratory: Negative for cough.   Cardiovascular: Negative for chest pain and palpitations.  Gastrointestinal: Positive for abdominal pain, diarrhea (resolved), nausea (resolved) and vomiting (resolved). Negative for abdominal distention, blood in stool and constipation.      Objective:    BP 136/84 (BP Location: Right Wrist, Patient Position: Sitting, Cuff Size: Normal)   Pulse 72   Temp 98.2 F (36.8 C)   Wt 298 lb 3.2 oz (135.3 kg)   SpO2 96%   BMI 48.13 kg/m  BP Readings from  Last 3 Encounters:  01/04/19 136/84  03/02/18 138/80  08/05/17 (!) 154/80   Wt Readings from Last 3 Encounters:  01/04/19 298 lb 3.2 oz (135.3 kg)  03/02/18 297 lb 2 oz (134.8 kg)  08/05/17 291 lb (132 kg)    Physical Exam Vitals signs reviewed.  Constitutional:      Appearance: Normal appearance. She is well-developed.  Eyes:     Conjunctiva/sclera: Conjunctivae normal.  Cardiovascular:     Rate and Rhythm: Normal rate and regular rhythm.     Pulses: Normal pulses.     Heart sounds: Normal heart sounds.  Pulmonary:     Effort: Pulmonary effort is normal.     Breath sounds: Normal breath sounds. No wheezing, rhonchi or rales.  Abdominal:     General: Bowel sounds are normal. There is no distension.     Palpations: Abdomen is soft. Abdomen is not rigid. There is no fluid wave or mass.     Tenderness: There is no abdominal tenderness. There is no guarding or rebound. Negative signs include Murphy's sign.     Comments: No guarding or rebound  Skin:    General: Skin is warm and dry.  Neurological:     Mental Status: She is alert.  Psychiatric:        Speech: Speech normal.        Behavior: Behavior normal.        Thought Content: Thought content normal.        Assessment & Plan:   Problem List Items Addressed This Visit      Other   Abdominal pain    Patient is afebrile today, nontoxic in appearance.  She also benign abdominal exam in the office.  Differentials include cholelithiasis, diverticulitis, GERD.  Patient declines a stat CT abdomen and pelvis done today.  She feels comfortable with waiting until is more convenient based on her schedule until Wednesday, January 29.  Pending labs at this time.  Advised her of any new or worsening symptoms, she will notify us immediately.      Relevant Orders   CT ABDOMEN PELVIS W CONTRAST   Comprehensive metabolic panel (Completed)   CBC with Differential/Platelet (Completed)   Lipase (Completed)    Other Visit Diagnoses      Right upper quadrant pain       Relevant Orders   CT ABDOMEN PELVIS W CONTRAST       I am having [name]. [place] maintain her ibuprofen and Vitamin D.   No orders of the defined types were placed in this encounter.   Return precautions given.   Risks, benefits, and alternatives of the medications and treatment plan prescribed today were discussed, and patient expressed understanding.   Education regarding symptom management and diagnosis given to patient on  AVS.  Continue to follow with Burnard Hawthorne, FNP for routine health maintenance.   Miranda Aguilar and I agreed with plan.   Mable Paris, FNP

## 2019-01-05 ENCOUNTER — Telehealth: Payer: Self-pay | Admitting: *Deleted

## 2019-01-05 LAB — COMPREHENSIVE METABOLIC PANEL
ALK PHOS: 65 U/L (ref 39–117)
ALT: 31 U/L (ref 0–35)
AST: 24 U/L (ref 0–37)
Albumin: 4.3 g/dL (ref 3.5–5.2)
BILIRUBIN TOTAL: 0.5 mg/dL (ref 0.2–1.2)
BUN: 17 mg/dL (ref 6–23)
CO2: 24 meq/L (ref 19–32)
CREATININE: 0.69 mg/dL (ref 0.40–1.20)
Calcium: 9.5 mg/dL (ref 8.4–10.5)
Chloride: 103 mEq/L (ref 96–112)
GFR: 87.48 mL/min (ref >60.00)
Glucose, Bld: 85 mg/dL (ref 70–99)
Potassium: 3.9 mEq/L (ref 3.5–5.1)
SODIUM: 137 meq/L (ref 135–145)
TOTAL PROTEIN: 7.5 g/dL (ref 6.0–8.3)

## 2019-01-05 LAB — CBC WITH DIFFERENTIAL/PLATELET
BASOS ABS: 0 10*3/uL (ref 0.0–0.1)
Basophils Relative: 1 % (ref 0.0–3.0)
Eosinophils Absolute: 0.2 10*3/uL (ref 0.0–0.7)
Eosinophils Relative: 4.1 % (ref 0.0–5.0)
HCT: 42.1 % (ref 36.0–46.0)
Hemoglobin: 14.5 g/dL (ref 12.0–15.0)
LYMPHS ABS: 1.9 10*3/uL (ref 0.7–4.0)
Lymphocytes Relative: 44.6 % (ref 12.0–46.0)
MCHC: 34.5 g/dL (ref 30.0–36.0)
MCV: 92.4 fl (ref 78.0–100.0)
MONO ABS: 0.5 10*3/uL (ref 0.1–1.0)
MONOS PCT: 11.3 % (ref 3.0–12.0)
NEUTROS ABS: 1.7 10*3/uL (ref 1.4–7.7)
NEUTROS PCT: 39 % — AB (ref 43.0–77.0)
PLATELETS: 240 10*3/uL (ref 150.0–400.0)
RBC: 4.56 Mil/uL (ref 3.87–5.11)
RDW: 12.9 % (ref 11.5–15.5)
WBC: 4.4 10*3/uL (ref 4.0–10.5)

## 2019-01-05 LAB — LIPASE: LIPASE: 16 U/L (ref 11.0–59.0)

## 2019-01-05 NOTE — Assessment & Plan Note (Signed)
Patient is afebrile today, nontoxic in appearance.  She also benign abdominal exam in the office.  Differentials include cholelithiasis, diverticulitis, GERD.  Patient declines a stat CT abdomen and pelvis done today.  She feels comfortable with waiting until is more convenient based on her schedule until Wednesday, January 29.  Pending labs at this time.  Advised her of any new or worsening symptoms, she will notify us immediately.

## 2019-01-05 NOTE — Telephone Encounter (Signed)
I left patient detailed message stating that she could go to either to pick up contrast. I asked that she call back if she has any further questions.

## 2019-01-05 NOTE — Telephone Encounter (Signed)
Copied from [place] [id number]. Topic: Quick Sport and exercise psychologist Patient (Clinic Use ONLY) >> Jan 05, 2019 11:42 AM Micheline Maze B wrote: Reason for CRM: ok for pec to give appt info. Mable Paris ordered STAT CT abdomen/pelvis. Scheduled for Wednesday Jan. 29. She will need to arrive by 8:30 am for check in. Nothing to eat or drink after midnight. She will need to pick up her prep kit (contrast drink) by today. This will be done at [facility] located at [place], [place]. If she needs to reschedule she will need to call [phone]. >> Jan 05, 2019  1:13 PM Selinda Flavin B, [place] wrote: Patient calling and was given the message about her CT. Would like to know where she needs to pick up the contrast? At [place] or the office?

## 2019-01-06 ENCOUNTER — Ambulatory Visit
Admission: RE | Admit: 2019-01-06 | Discharge: 2019-01-06 | Disposition: A | Discharge status: Home / Self Care | Payer: BLUE CROSS/BLUE SHIELD | Source: Ambulatory Visit | Attending: Family | Admitting: Family

## 2019-01-06 ENCOUNTER — Telehealth: Payer: Self-pay | Admitting: Family

## 2019-01-06 DIAGNOSIS — R1011 Right upper quadrant pain: Secondary | ICD-10-CM | POA: Diagnosis not present

## 2019-01-06 DIAGNOSIS — R109 Unspecified abdominal pain: Secondary | ICD-10-CM | POA: Diagnosis not present

## 2019-01-06 DIAGNOSIS — K828 Other specified diseases of gallbladder: Secondary | ICD-10-CM | POA: Diagnosis not present

## 2019-01-06 MED ORDER — IOPAMIDOL (ISOVUE-370) INJECTION 76%
100.0000 mL | Freq: Once | INTRAVENOUS | Status: AC | PRN
  Administered 2019-01-06: 100 mL via INTRAVENOUS

## 2019-01-06 NOTE — Telephone Encounter (Signed)
Call report on CT done at [facility]. Final results available in Epic.No answer on FC line. Will route high priority.

## 2019-01-06 NOTE — Telephone Encounter (Signed)
FYI

## 2019-01-07 ENCOUNTER — Other Ambulatory Visit: Payer: Self-pay | Admitting: Family

## 2019-01-07 DIAGNOSIS — R109 Unspecified abdominal pain: Secondary | ICD-10-CM

## 2019-01-08 ENCOUNTER — Encounter: Payer: Self-pay | Admitting: Family

## 2019-01-12 ENCOUNTER — Ambulatory Visit: Payer: BLUE CROSS/BLUE SHIELD | Admitting: Surgery

## 2019-01-14 ENCOUNTER — Other Ambulatory Visit: Payer: Self-pay

## 2019-01-14 ENCOUNTER — Encounter: Payer: Self-pay | Admitting: Surgery

## 2019-01-14 ENCOUNTER — Ambulatory Visit (INDEPENDENT_AMBULATORY_CARE_PROVIDER_SITE_OTHER): Payer: BLUE CROSS/BLUE SHIELD | Admitting: Surgery

## 2019-01-14 VITALS — BP 157/93 | HR 72 | Temp 97.5°F | Resp 18 | Ht 66.0 in | Wt 293.6 lb

## 2019-01-14 DIAGNOSIS — K563 Gallstone ileus: Secondary | ICD-10-CM | POA: Diagnosis not present

## 2019-01-14 DIAGNOSIS — K432 Incisional hernia without obstruction or gangrene: Secondary | ICD-10-CM

## 2019-01-14 DIAGNOSIS — K8020 Calculus of gallbladder without cholecystitis without obstruction: Secondary | ICD-10-CM | POA: Diagnosis not present

## 2019-01-14 NOTE — Patient Instructions (Addendum)
Please keep a diary of what seems to cause your abdominal.   Gallbladder Eating Plan If you have a gallbladder condition, you may have trouble digesting fats. Eating a low-fat diet can help reduce your symptoms, and may be helpful before and after having surgery to remove your gallbladder (cholecystectomy). Your health care provider may recommend that you work with a diet and nutrition specialist (dietitian) to help you reduce the amount of fat in your diet. What are tips for following this plan? General guidelines  Limit your fat intake to less than 30% of your total daily calories. If you eat around 1,800 calories each day, this is less than 60 grams (g) of fat per day.  Fat is an important part of a healthy diet. Eating a low-fat diet can make it hard to maintain a healthy body weight. Ask your dietitian how much fat, calories, and other nutrients you need each day.  Eat small, frequent meals throughout the day instead of three large meals.  Drink at least 8-10 cups of fluid a day. Drink enough fluid to keep your urine clear or pale yellow.  Limit alcohol intake to no more than 1 drink a day for nonpregnant women and 2 drinks a day for men. One drink equals 12 oz of beer, 5 oz of wine, or 1 oz of hard liquor. Reading food labels  Check Nutrition Facts on food labels for the amount of fat per serving. Choose foods with less than 3 grams of fat per serving. Shopping  Choose nonfat and low-fat healthy foods. Look for the words "nonfat," "low fat," or "fat free."  Avoid buying processed or prepackaged foods. Cooking  Cook using low-fat methods, such as baking, broiling, grilling, or boiling.  Cook with small amounts of healthy fats, such as olive oil, grapeseed oil, canola oil, or sunflower oil. What foods are recommended?   All fresh, frozen, or canned fruits and vegetables.  Whole grains.  Low-fat or non-fat (skim) milk and yogurt.  Lean meat, skinless poultry, fish, eggs,  and beans.  Low-fat protein supplement powders or drinks.  Spices and herbs. What foods are not recommended?  High-fat foods. These include baked goods, fast food, fatty cuts of meat, ice cream, french toast, sweet rolls, pizza, cheese bread, foods covered with butter, creamy sauces, or cheese.  Fried foods. These include french fries, tempura, battered fish, breaded chicken, fried breads, and sweets.  Foods with strong odors.  Foods that cause bloating and gas. Summary  A low-fat diet can be helpful if you have a gallbladder condition, or before and after gallbladder surgery.  Limit your fat intake to less than 30% of your total daily calories. This is about 60 g of fat if you eat 1,800 calories each day.  Eat small, frequent meals throughout the day instead of three large meals. This information is not intended to replace advice given to you by your health care provider. Make sure you discuss any questions you have with your health care provider. Document Released: 11/30/2013 Document Revised: 01/02/2017 Document Reviewed: 01/02/2017 Elsevier Interactive Patient Education  2019 [employer]. l pain.

## 2019-01-14 NOTE — Progress Notes (Signed)
Surgical Clinic History and Physical  Referring provider:  Burnard Hawthorne, FNP [place], [place] [address]  HISTORY OF PRESENT ILLNESS (HPI):  58 y.o. female presents for evaluation of abdominal pain. Patient reports she once in October experienced upper abdominal pain in the evening after having eaten chicken chili, which then resolved a few hours later without intervention otherwise. She then again experienced similar 2 weeks ago, likewise after dinner, which also resolved a few hours later. She recalls that she has a history of GERD, for which she'd previously taken Prilosec for several years until "weaning" herself from it without any GERD-type symptoms since. She also previously underwent laparotomy in [place] [facility], 2016) for gallstone ileus, at which time a Meckel's diverticulum was also diagnosed and resected at the same time. She also during the same surgery underwent primary repair of her umbilical hernia, and she since underwent mesh repair of her recurrent umbilical hernia ([facility], 2018). She says she has since not been aware of any abdominal wall "bulges", though rarely feels "a little" tugging at her umbilicus that she's attributed to scar tissue at her former umbilical hernia repair. She otherwise denies any further or additional abdominal pain, post-prandial or otherwise and denies N/V, fever/chills, CP, or SOB. Patient also notes she has for many years attempted to lose weight unsuccessfully with her weight decreased and then increasing again. She says she'd like to lose weight, but she is not currently interested in any surgery.  PAST MEDICAL HISTORY (PMH):  Past Medical History:  Diagnosis Date  . Chicken pox   . Complication of anesthesia    bradycardia (low 40's)during colonoscopy  . Constipation    Occasionally  . 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 GERD (gastroesophageal reflux disease)    Heartburn  . Hypertension    01/2015  . Obesity     PAST SURGICAL  HISTORY [facility]):  Past Surgical History:  Procedure Laterality Date  . COLONOSCOPY N/A 05/22/2015   Procedure: COLONOSCOPY;  Surgeon: Danie Binder, MD;  [facility];  Service: Endoscopy;  Laterality: N/A;  8:30 AM  . INSERTION OF MESH  06/20/2017   Procedure: INSERTION OF MESH;  Surgeon: Christene Lye, MD;  Location: [facility] ORS;  Service: General;;  umbilical   . LAPAROTOMY N/A 02/01/2015   Procedure: EXPLORATORY LAPAROTOMY, MECKEL'S DIVERTICULECTOMY;  Surgeon: Jamesetta So, MD;  Location: [facility];  Service: General;  Laterality: N/A;  . TONSILLECTOMY    . UMBILICAL HERNIA REPAIR N/A 06/20/2017   Procedure: HERNIA REPAIR UMBILICAL ADULT;  Surgeon: Christene Lye, MD;  Location: [facility] ORS;  Service: General;  Laterality: N/A;  . WISDOM TOOTH EXTRACTION      MEDICATIONS:  Prior to Admission medications   Medication Sig Start Date End Date Taking? Authorizing Provider  Cholecalciferol (VITAMIN D) 2000 units CAPS Take 2,000 Units by mouth daily.   Yes [provider]  ibuprofen (ADVIL,MOTRIN) 200 MG tablet Take 400 mg by mouth every 8 (eight) hours as needed for mild pain or moderate pain.    Yes [provider]  metoprolol succinate (TOPROL-XL) 50 MG 24 hr tablet Take 1 tablet (50 mg total) by mouth daily. with food 01/04/19  Yes Arnett, Yvetta Coder, FNP    ALLERGIES:  No Known Allergies   SOCIAL HISTORY:  Social History   Socioeconomic History  . Marital status: Married    Spouse name: Not on file  . Number of children: Not on file  . Years of  education: Not on file  . Highest education level: Not on file  Occupational History  . Not on file  Social Needs  . Financial resource strain: Not on file  . Food insecurity:    Worry: Not on file    Inability: Not on file  . Transportation needs:    Medical: Not on file    Non-medical: Not on file  Tobacco Use  . Smoking status: Never Smoker  . Smokeless tobacco: Never Used  Substance and  Sexual Activity  . Alcohol use: Yes    Alcohol/week: 5.0 standard drinks    Types: 5 Glasses of wine per week    Comment: 5-6 glasses a week  . Drug use: No  . Sexual activity: Yes    Partners: Male    Comment: Husband   Lifestyle  . Physical activity:    Days per week: Not on file    Minutes per session: Not on file  . Stress: Not on file  Relationships  . Social connections:    Talks on phone: Not on file    Gets together: Not on file    Attends religious service: Not on file    Active member of club or organization: Not on file    Attends meetings of clubs or organizations: Not on file    Relationship status: Not on file  . Intimate partner violence:    Fear of current or ex partner: Not on file    Emotionally abused: Not on file    Physically abused: Not on file    Forced sexual activity: Not on file  Other Topics Concern  . Not on file  Social History Narrative   Works at [employer] husband    No children   2 dogs, 1 cat, 1 frog All live inside    Right handed    Caffeine- 1 cup of coffee   Enjoys- showing and training dogs - GSP and collie.   Exercise- training dogs for dog show    The patient currently resides (home / rehab facility / nursing home): Home The patient normally is (ambulatory / bedbound): Ambulatory  FAMILY HISTORY:  Family History  Problem Relation Age of Onset  . Cancer Mother   . Diabetes Mother   . Hyperlipidemia Mother   . Heart disease Mother        CHF  . Stroke Father   . Diabetes Sister   . Osteoporosis Sister   . Diabetes Brother   . Breast cancer Paternal Aunt 56    Otherwise negative/non-contributory.  REVIEW OF SYSTEMS:  Constitutional: denies any other weight loss, fever, chills, or sweats  Eyes: denies any other vision changes, history of eye injury  ENT: denies sore throat, hearing problems  Respiratory: denies shortness of breath, wheezing  Cardiovascular: denies chest pain, palpitations   Gastrointestinal: abdominal pain, N/V, and bowel function as per HPI Musculoskeletal: denies any other joint pains or cramps  Skin: Denies any other rashes or skin discolorations Neurological: denies any other headache, dizziness, weakness  Psychiatric: Denies any other depression, anxiety   All other review of systems were otherwise negative   VITAL SIGNS:  BP (!) 157/93   Pulse 72   Temp (!) 97.5 F (36.4 C) (Temporal)   Resp 18   Ht 5\' 6"  (1.676 m)   Wt 293 lb 9.6 oz (133.2 kg)   SpO2 94%   BMI 47.39 kg/m   PHYSICAL EXAM:  Constitutional:  -- Obese body habitus  -- Awake, alert, and oriented x3  Eyes:  -- Pupils equally round and reactive to light  -- No scleral icterus  Ear, nose, throat:  -- No jugular venous distension -- No nasal drainage, bleeding Pulmonary:  -- No crackles  -- Equal breath sounds bilaterally -- Breathing non-labored at rest Cardiovascular:  -- S1, S2 present  -- No pericardial rubs  Gastrointestinal:  -- Abdomen soft, nontender, non-distended, no guarding/rebound  -- No abdominal masses appreciated, pulsatile or otherwise  Musculoskeletal and Integumentary:  -- Wounds or skin discoloration: None appreciated except well-healed laparotomy scar -- Extremities: B/L UE and LE FROM, hands and feet warm  Neurologic:  -- Motor function: Intact and symmetric -- Sensation: Intact and symmetric  Labs:  CBC Latest Ref Rng & Units 01/04/2019 03/02/2018 06/17/2017  WBC 4.0 - 10.5 K/uL 4.4 3.8(L) -  Hemoglobin 12.0 - 15.0 g/dL 14.5 14.2 12.7  Hematocrit 36.0 - 46.0 % 42.1 41.2 -  Platelets 150.0 - 400.0 K/uL 240.0 213.0 -   CMP Latest Ref Rng & Units 01/04/2019 03/02/2018 12/12/2015  Glucose 70 - 99 mg/dL 85 119(H) 99  BUN 6 - 23 mg/dL 17 14 23   Creatinine 0.40 - 1.20 mg/dL 0.69 0.60 0.67  Sodium 135 - 145 mEq/L 137 139 137  Potassium 3.5 - 5.1 mEq/L 3.9 4.5 4.3  Chloride 96 - 112 mEq/L 103 104 102  CO2 19 - 32 mEq/L 24 27 27   Calcium 8.4 - 10.5  mg/dL 9.5 9.1 9.8  Total Protein 6.0 - 8.3 g/dL 7.5 7.3 7.5  Total Bilirubin 0.2 - 1.2 mg/dL 0.5 0.6 0.4  Alkaline Phos 39 - 117 U/L 65 53 65  AST 0 - 37 U/L 24 21 18   ALT 0 - 35 U/L 31 25 19    Imaging studies:  CT Abdomen and Pelvis with Contrast (01/06/2019) - personally reviewed with patient in office . Contracted gallbladder without gallbladder wall thickening or biliary dilatation. Fatty liver 2. Ventral hernia containing transverse colon and small bowel loops in the midline. No bowel obstruction or edema. Normal appendix   Assessment/Plan: 58 y.o. female with symptomatic cholelithiasis s/p former surgery for gallstone ileus and large bowel-containing recurrent ventral/incisional hernia, complicated by pertinent comorbidities including morbid obesity (BMI >47), HTN, GERD, and occasional constipation.               - avoid/minimize foods with higher fat content (meats, cheeses/dairy, and fried)             - prefer low-fat vegetables, whole grains (wheat bread, ceareals, etc), and fruits until cholecystectomy             - discussed with patient signs and symptoms of hernia incarceration and obstruction             - strategies for manual self-reduction of patient's hernia also reviewed and discussed  - maintain hydration with high fiber heart healthy diet to reduce/minimize constipation +/- daily stool softener as needed              - all risks, benefits, and alternatives to cholecystectomy and/or repair of recurrent non-obstructing ventral/incisional hernia were discussed with the patient, all of her questions were answered to patient's expressed satisfaction, patient adamantly expresses she is not currently interested in any surgical intervention at this time.  - follow-up appointment to review dietary log and symptoms accordingly was also offered and was also declined  - bariatric medical physician contact information was provided  per patient request             - return to clinic  as needed, instructed to call if any questions or concerns (or if becomes interested in cholecystectomy and/or hernia repair)  All of the above recommendations were discussed with the patient, and all of patient's questions were answered to her expressed satisfaction.  Thank you for the opportunity to participate in this patient's care.  -- Marilynne Drivers Rosana Hoes, MD, [place]: [facility] - Partnering for exceptional care. Office: [phone]

## 2019-01-24 ENCOUNTER — Encounter: Payer: Self-pay | Admitting: Surgery

## 2019-01-24 DIAGNOSIS — K8020 Calculus of gallbladder without cholecystitis without obstruction: Secondary | ICD-10-CM | POA: Insufficient documentation

## 2019-01-24 DIAGNOSIS — K432 Incisional hernia without obstruction or gangrene: Secondary | ICD-10-CM | POA: Insufficient documentation

## 2019-02-05 ENCOUNTER — Ambulatory Visit: Payer: Self-pay

## 2019-02-05 NOTE — Telephone Encounter (Signed)
Patient says this is the worst pain she has ever had she just turned over in bed and lower back pain is hurting rated at 10, patient is scheduled for Monday does not have away to be examined today no transportation. Is there anything she can do at home?

## 2019-02-05 NOTE — Telephone Encounter (Signed)
Patient scheduled tomorrow for Saturday clinic at [facility].

## 2019-02-05 NOTE — Telephone Encounter (Signed)
Call pt Without seeing her, I cannot recommend anything beyond what she has tried  Would she be willing to come to Saturday clinic tomorrow?? This is in [place] and its with our [place] provider  Would she  Go to urgent care?   I really do not feel comfortable waiting until Monday is pain is so severe

## 2019-02-05 NOTE — Telephone Encounter (Signed)
Patient called with c/o "back pain." She says "I woke up last Thursday with soreness to my lower back. I got up and took a shower, then the pain started. I treated it with heat, ice, ibuprofen over the weekend. My back has remained sore, but was tolerable. Today I got up at 0600, laid back down then went to get OOB at 0630, the pain was so bad moving that I could barely make it, I was able to stand and walk, but the pain is causing me problems to walk. The pain when walking is a sharp pain at a 8-9; standing a 3-4; sitting is a dull, soreness, type pain and it's tolerable. Only when I change positions to stand and walk is the pain so severe. The Ibuprofen I take is not really helping. I'm not able to come in today because I live 30 minutes away. Is there something that I can be doing to help relieve the pain?" I advised at this point, an OV is the best option. I asked what day is she available, she says Monday is the best day. I advised only one Same Day slot available on Monday with Joycelyn Schmid at 1315 and no availability with any other provider in the office. I offered to schedule with [facility] or another [employer], she says that she would prefer to see Joycelyn Schmid on Monday. I advised I will send this over to the office and someone will call back with the appointment confirmation as I am not able to schedule and no one picked up at the office when I called. Care advice given, patient verbalized understanding.   Reason for Disposition . [1] MODERATE back pain (e.g., interferes with normal activities) AND [2] present > 3 days  Answer Assessment - Initial Assessment Questions 1. ONSET: "When did the pain begin?"      Last Thursday woke up back sore, then painful 2. LOCATION: "Where does it hurt?" (upper, mid or lower back)     Lower back 3. SEVERITY: "How bad is the pain?"  (e.g., Scale 1-10; mild, moderate, or severe)   - MILD (1-3): doesn't interfere with normal activities    - MODERATE (4-7):  interferes with normal activities or awakens from sleep    - SEVERE (8-10): excruciating pain, unable to do any normal activities      Sitting I'm fine, just soreness, dull; trying to stand 8-9 sharp; 3-4 when standing 4. PATTERN: "Is the pain constant?" (e.g., yes, no; constant, intermittent)      Only trying to move 5. RADIATION: "Does the pain shoot into your legs or elsewhere?"     No 6. CAUSE:  "What do you think is causing the back pain?"      I don't know 7. BACK OVERUSE:  "Any recent lifting of heavy objects, strenuous work or exercise?"     No 8. MEDICATIONS: "What have you taken so far for the pain?" (e.g., nothing, acetaminophen, NSAIDS)     Ibuprofen, heat, ice 9. NEUROLOGIC SYMPTOMS: "Do you have any weakness, numbness, or problems with bowel/bladder control?"     No 10. OTHER SYMPTOMS: "Do you have any other symptoms?" (e.g., fever, abdominal pain, burning with urination, blood in urine)       No 11. PREGNANCY: "Is there any chance you are pregnant?" (e.g., yes, no; LMP)       No  Protocols used: BACK PAIN-A-AH

## 2019-02-05 NOTE — Telephone Encounter (Signed)
Patient called on cell and home phone, left VM to return call to the office to speak to a [place] about her back pain.

## 2019-02-06 ENCOUNTER — Ambulatory Visit: Payer: BLUE CROSS/BLUE SHIELD | Admitting: Family Medicine

## 2019-02-06 ENCOUNTER — Encounter: Payer: Self-pay | Admitting: Family Medicine

## 2019-02-06 VITALS — BP 134/76 | HR 76 | Temp 97.6°F | Ht 66.0 in | Wt 283.0 lb

## 2019-02-06 DIAGNOSIS — M5442 Lumbago with sciatica, left side: Secondary | ICD-10-CM

## 2019-02-06 MED ORDER — KETOROLAC TROMETHAMINE 60 MG/2ML IM SOLN
60.0000 mg | Freq: Once | INTRAMUSCULAR | Status: AC
  Administered 2019-02-06: 60 mg via INTRAMUSCULAR

## 2019-02-06 MED ORDER — METHYLPREDNISOLONE ACETATE 80 MG/ML IJ SUSP
80.0000 mg | Freq: Once | INTRAMUSCULAR | Status: AC
  Administered 2019-02-06: 80 mg via INTRAMUSCULAR

## 2019-02-06 MED ORDER — METHYLPREDNISOLONE 4 MG PO TBPK: ORAL_TABLET | ORAL | 0 refills | Status: DC

## 2019-02-06 MED ORDER — CYCLOBENZAPRINE HCL 5 MG PO TABS
5.0000 mg | ORAL_TABLET | Freq: Three times a day (TID) | ORAL | 1 refills | Status: DC | PRN
Start: 2019-02-06 — End: 2022-08-07

## 2019-02-06 NOTE — Progress Notes (Signed)
Subjective:    Patient ID: Miranda Aguilar, female    DOB: 1961-05-31, 58 y.o.   MRN: [id number]  HPI   Patient presents to clinic complaining of left-sided low back pain for about 1 week.  Denies any known injury.  States she does sleep on her side in a semi-twisted position due to cat sleeping on her hip.  Thinks possibly this twisting position while sleeping could have caused back pain.  Denies numbness or tingling in legs.  Denies saddle anesthesia.  Denies loss of bowel or bladder control.  Patient has been trying alternating Tylenol and Motrin, applying heat, and rest without much effect.  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  Believes this could have exacerbated the pain even more.  States the muscle spasms were very bad yesterday, could not even lean forward while sitting without having a spasm and jolt across her left low back.  Denies fever or chills.   Patient Active Problem List   Diagnosis Date Noted  . Symptomatic cholelithiasis 01/24/2019  . Recurrent ventral hernia 01/24/2019  . Skin lesion 03/02/2018  . Morbid obesity ([place]) 12/17/2015  . Vitamin D deficiency 08/21/2015  . GERD (gastroesophageal reflux disease) 05/28/2015  . Pain in joint, lower leg 03/26/2015  . Routine physical examination 03/26/2015  . Essential hypertension 02/01/2015  . Hyperglycemia 01/30/2015  . 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 Acute renal failure ([place]) 01/30/2015  . Abdominal pain in female   . Abdominal pain 01/29/2015   Social History   Tobacco Use  . Smoking status: Never Smoker  . Smokeless tobacco: Never Used  Substance Use Topics  . Alcohol use: Yes    Alcohol/week: 5.0 standard drinks    Types: 5 Glasses of wine per week    Comment: 5-6 glasses a week   Review of Systems  Constitutional: Negative for chills, fatigue and fever.  HENT: Negative for  congestion, ear pain, sinus pain and sore throat.   Eyes: Negative.   Respiratory: Negative for cough, shortness of breath and wheezing.   Cardiovascular: Negative for chest pain, palpitations and leg swelling.  Gastrointestinal: Negative for abdominal pain, diarrhea, nausea and vomiting.  Genitourinary: Negative for dysuria, frequency and urgency.  Musculoskeletal: +low back pain  Skin: Negative for color change, pallor and rash.  Neurological: Negative for syncope, light-headedness and headaches.  Psychiatric/Behavioral: The patient is not nervous/anxious.    Objective:   Physical Exam Vitals signs and nursing note reviewed.  Constitutional:      General: She is not in acute distress.    Appearance: She is obese.  HENT:     Head: Normocephalic and atraumatic.  Eyes:     General: No scleral icterus.    Extraocular Movements: Extraocular movements intact.     Pupils: Pupils are equal, round, and reactive to light.  Neck:     Musculoskeletal: Normal range of motion and neck supple. No neck rigidity.  Cardiovascular:     Rate and Rhythm: Normal rate and regular rhythm.     Heart sounds: Normal heart sounds.  Pulmonary:     Effort: Pulmonary effort is normal.     Breath sounds: Normal breath sounds.  Musculoskeletal:        General: No deformity.     Lumbar back: She exhibits tenderness.       Back:  Right lower leg: No edema.     Left lower leg: No edema.     Comments: Location of pain represented by blue box on diagram.  +left sided low back pain radiating to top of left buttock and top of left leg. No pain with straight leg raises of left or right legs. Quadriceps strength equal and strong. No pain with adduction/abduction or left & right legs at hip joint.   Skin:    General: Skin is warm and dry.     Coloration: Skin is not jaundiced or pale.  Neurological:     Mental Status: She is alert and oriented to person, place, and time.     Sensory: No sensory deficit.      Motor: No weakness.     Gait: Gait normal.  Psychiatric:        Mood and Affect: Mood normal.        Behavior: Behavior normal.     Today's Vitals   02/06/19 0947  BP: 134/76  Pulse: 76  Temp: 97.6 F (36.4 C)  TempSrc: Oral  SpO2: 97%  Weight: 283 lb (128.4 kg)  Height: 5\' 6"  (1.676 m)   Body mass index is 45.68 kg/m.     Assessment & Plan:    Acute left-sided low back pain with sciatica, muscle spasm - IM methylprednisolone and Toradol given in clinic x1.  Patient will take oral steroid taper.  She will use cyclobenzaprine if needed for muscle spasms.  Also discussed topical rubs like BenGay or Biofreeze for pain reduction, and use of heating pad.  Patient given handout outlining gentle stretching and range of motion exercises she can do multiple times throughout the day to keep back and back muscles stretched.   Administrations This Visit    ketorolac (TORADOL) injection 60 mg    Admin Date 02/06/2019 Action Given Dose 60 mg Route Intramuscular Administered By Verlene Mayer A, CMA       methylPREDNISolone acetate (DEPO-MEDROL) injection 80 mg    Admin Date 02/06/2019 Action Given Dose 80 mg Route Intramuscular Administered By Shanon Ace, CMA         Advised if pain worsens rather than improves, develops any numbness or tingling in legs, loss of bowel or bladder control, increased muscle spasms to call on-call and or go to urgent care/ER right away.  Patient will otherwise keep regularly scheduled follow-up with PCP as planned.

## 2019-02-06 NOTE — Patient Instructions (Addendum)
Steroid injection and Toradol injection given in clinic.  These should work within the next 30 min to half hour to help reduce pain.  Take oral steroid taper down to reduce inflammation and use muscle relaxer as needed.  Also can use topical rubs like a BenGay or Biofreeze to reduce pain and heating pads are very nice for pain reduction as well.  Do daily back exercises and slowly advance activity as tolerated.  Back Exercises If you have pain in your back, do these exercises 2-3 times each day or as told by your doctor. When the pain goes away, do the exercises once each day, but repeat the steps more times for each exercise (do more repetitions). If you do not have pain in your back, do these exercises once each day or as told by your doctor. Exercises Single Knee to Chest Do these steps 3-5 times in a row for each leg: 1. Lie on your back on a firm bed or the floor with your legs stretched out. 2. Bring one knee to your chest. 3. Hold your knee to your chest by grabbing your knee or thigh. 4. Pull on your knee until you feel a gentle stretch in your lower back. 5. Keep doing the stretch for 10-30 seconds. 6. Slowly let go of your leg and straighten it. Pelvic Tilt Do these steps 5-10 times in a row: 1. Lie on your back on a firm bed or the floor with your legs stretched out. 2. Bend your knees so they point up to the ceiling. Your feet should be flat on the floor. 3. Tighten your lower belly (abdomen) muscles to press your lower back against the floor. This will make your tailbone point up to the ceiling instead of pointing down to your feet or the floor. 4. Stay in this position for 5-10 seconds while you gently tighten your muscles and breathe evenly. Cat-Cow Do these steps until your lower back bends more easily: 1. Get on your hands and knees on a firm surface. Keep your hands under your shoulders, and keep your knees under your hips. You may put padding under your knees. 2. Let your  head hang down, and make your tailbone point down to the floor so your lower back is round like the back of a cat. 3. Stay in this position for 5 seconds. 4. Slowly lift your head and make your tailbone point up to the ceiling so your back hangs low (sags) like the back of a cow. 5. Stay in this position for 5 seconds.  Press-Ups Do these steps 5-10 times in a row: 1. Lie on your belly (face-down) on the floor. 2. Place your hands near your head, about shoulder-width apart. 3. While you keep your back relaxed and keep your hips on the floor, slowly straighten your arms to raise the top half of your body and lift your shoulders. Do not use your back muscles. To make yourself more comfortable, you may change where you place your hands. 4. Stay in this position for 5 seconds. 5. Slowly return to lying flat on the floor.  Bridges Do these steps 10 times in a row: 1. Lie on your back on a firm surface. 2. Bend your knees so they point up to the ceiling. Your feet should be flat on the floor. 3. Tighten your butt muscles and lift your butt off of the floor until your waist is almost as high as your knees. If you do not feel the  muscles working in your butt and the back of your thighs, slide your feet 1-2 inches farther away from your butt. 4. Stay in this position for 3-5 seconds. 5. Slowly lower your butt to the floor, and let your butt muscles relax. If this exercise is too easy, try doing it with your arms crossed over your chest. Belly Crunches Do these steps 5-10 times in a row: 1. Lie on your back on a firm bed or the floor with your legs stretched out. 2. Bend your knees so they point up to the ceiling. Your feet should be flat on the floor. 3. Cross your arms over your chest. 4. Tip your chin a little bit toward your chest but do not bend your neck. 5. Tighten your belly muscles and slowly raise your chest just enough to lift your shoulder blades a tiny bit off of the floor. 6. Slowly  lower your chest and your head to the floor. Back Lifts Do these steps 5-10 times in a row: 1. Lie on your belly (face-down) with your arms at your sides, and rest your forehead on the floor. 2. Tighten the muscles in your legs and your butt. 3. Slowly lift your chest off of the floor while you keep your hips on the floor. Keep the back of your head in line with the curve in your back. Look at the floor while you do this. 4. Stay in this position for 3-5 seconds. 5. Slowly lower your chest and your face to the floor. Contact a doctor if:  Your back pain gets a lot worse when you do an exercise.  Your back pain does not lessen 2 hours after you exercise. If you have any of these problems, stop doing the exercises. Do not do them again unless your doctor says it is okay. Get help right away if:  You have sudden, very bad back pain. If this happens, stop doing the exercises. Do not do them again unless your doctor says it is okay. This information is not intended to replace advice given to you by your health care provider. Make sure you discuss any questions you have with your health care provider. Document Released: 12/28/2010 Document Revised: 08/19/2018 Document Reviewed: 01/19/2015 Elsevier Interactive Patient Education  [employer].

## 2019-02-08 ENCOUNTER — Ambulatory Visit: Payer: BLUE CROSS/BLUE SHIELD | Admitting: Family

## 2019-06-04 ENCOUNTER — Encounter: Payer: Self-pay | Admitting: Family

## 2019-06-25 ENCOUNTER — Encounter: Payer: Self-pay | Admitting: Family

## 2019-07-16 ENCOUNTER — Other Ambulatory Visit: Payer: Self-pay | Admitting: Family

## 2019-09-01 ENCOUNTER — Other Ambulatory Visit: Payer: Self-pay

## 2019-09-01 ENCOUNTER — Ambulatory Visit: Payer: BC Managed Care – PPO

## 2019-09-01 DIAGNOSIS — Z23 Encounter for immunization: Secondary | ICD-10-CM

## 2019-09-06 DIAGNOSIS — D225 Melanocytic nevi of trunk: Secondary | ICD-10-CM | POA: Diagnosis not present

## 2019-09-06 DIAGNOSIS — C44311 Basal cell carcinoma of skin of nose: Secondary | ICD-10-CM | POA: Diagnosis not present

## 2019-10-11 DIAGNOSIS — C44311 Basal cell carcinoma of skin of nose: Secondary | ICD-10-CM | POA: Diagnosis not present

## 2019-11-08 DIAGNOSIS — L905 Scar conditions and fibrosis of skin: Secondary | ICD-10-CM | POA: Diagnosis not present

## 2019-11-08 DIAGNOSIS — D485 Neoplasm of uncertain behavior of skin: Secondary | ICD-10-CM | POA: Diagnosis not present

## 2019-11-22 ENCOUNTER — Encounter: Payer: Self-pay | Admitting: Family

## 2019-11-22 ENCOUNTER — Ambulatory Visit (INDEPENDENT_AMBULATORY_CARE_PROVIDER_SITE_OTHER): Payer: BC Managed Care – PPO | Admitting: Family

## 2019-11-22 ENCOUNTER — Other Ambulatory Visit: Payer: Self-pay

## 2019-11-22 VITALS — BP 115/75 | HR 50 | Ht 66.0 in | Wt 245.0 lb

## 2019-11-22 DIAGNOSIS — I10 Essential (primary) hypertension: Secondary | ICD-10-CM

## 2019-11-22 DIAGNOSIS — Z0000 Encounter for general adult medical examination without abnormal findings: Secondary | ICD-10-CM

## 2019-11-22 MED ORDER — METOPROLOL SUCCINATE ER 25 MG PO TB24: 25.0000 mg | ORAL_TABLET | Freq: Every day | ORAL | 1 refills | Status: DC

## 2019-11-22 NOTE — Patient Instructions (Addendum)
Trial decrease of metoprol to 25mg , monitor heart rate and blood pressure.  I sent in 25 mg dose to pharmacy in case you were to need it.   Let me know how you are feeling Schedule mammogram.    Stay safe!

## 2019-11-22 NOTE — Assessment & Plan Note (Addendum)
Due to virtual nature of visit and patient preference, we jointly agreed to perform pap and CPE labs in spring. Ordered mammogram and patient will schedule.  Patient will continue self breast exams at home.  Up-to-date colonoscopy.

## 2019-11-22 NOTE — Assessment & Plan Note (Addendum)
Low HR , borderline low blood pressure.  Asymptomatic. suspect weight loss contributory. Trial decrease of metoprolol 25mg  QD.  Patient will me know how she is doing

## 2019-11-22 NOTE — Progress Notes (Addendum)
Virtual Visit via Video Note  I connected with@  on 11/22/19 at  8:30 AM EST by a video enabled telemedicine application and verified that I am speaking with the correct person using two identifiers.  Location patient: home Location provider:home office Persons participating in the virtual visit: patient, provider  I discussed the limitations of evaluation and management by telemedicine and the availability of in person appointments. The patient expressed understanding and agreed to proceed.  Interactive audio and video telecommunications were attempted between this provider and patient, however failed, due to patient having technical difficulties or patient did not have access to video capability.  We continued and completed visit with audio only.    HPI: Here for virtual CPE.  Feels well, no complaints.   HTN- checks blood pressure at home, ranges 110-120/  65-75. HR runs below 60. 'normally around 55'.  Noticed that her pulse has been lower as well.  No dizziness, weakness, syncope.  Due for Pap smear.  Last Pap 08/2016 was negative malignancy, showed endometrial cells.  She was referred to GYN for endometrial biopsy by Dr.Schermerhorn. per patient normal ( unable to see results).  Due for mammogram. Routine SBE at home.  Colonoscopy is up-to-date and she is on a 10-year schedule.  Tetanus is up-to-date Following with dermatology for basal cell carcinoma and pending MOES 12/2018. Dr Nevada Crane in [place].    ROS: See pertinent positives and negatives per HPI.  Past Medical History:  Diagnosis Date  . Basal cell carcinoma 2019   nose  . Chicken pox   . Complication of anesthesia    bradycardia (low 40's)during colonoscopy  . Constipation    Occasionally  . 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 GERD (gastroesophageal reflux disease)    Heartburn  . Hypertension    01/2015  . Obesity     Past Surgical History:  Procedure Laterality Date  . COLONOSCOPY N/A 05/22/2015   Procedure:  COLONOSCOPY;  Surgeon: Danie Binder, MD;  Location: [facility];  Service: Endoscopy;  Laterality: N/A;  8:30 AM  . INSERTION OF MESH  06/20/2017   Procedure: INSERTION OF MESH;  Surgeon: Christene Lye, MD;  Location: [facility] ORS;  Service: General;;  umbilical   . LAPAROTOMY N/A 02/01/2015   Procedure: EXPLORATORY LAPAROTOMY, MECKEL'S DIVERTICULECTOMY;  Surgeon: Jamesetta So, MD;  Location: [facility] ORS;  Service: General;  Laterality: N/A;  . TONSILLECTOMY    . UMBILICAL HERNIA REPAIR N/A 06/20/2017   Procedure: HERNIA REPAIR UMBILICAL ADULT;  Surgeon: Christene Lye, MD;  Location: [facility] ORS;  Service: General;  Laterality: N/A;  . WISDOM TOOTH EXTRACTION      Family History  Problem Relation Age of Onset  . Cancer Mother   . Diabetes Mother   . Hyperlipidemia Mother   . Heart disease Mother        CHF  . Stroke Father   . Diabetes Sister   . Osteoporosis Sister   . Diabetes Brother   . Breast cancer Paternal Aunt 93    SOCIAL HX: never smoker   Current Outpatient Medications:  .  Cholecalciferol (VITAMIN D) 2000 units CAPS, Take 2,000 Units by mouth daily., Disp: , Rfl:  .  cyclobenzaprine (FLEXERIL) 5 MG tablet, Take 1 tablet (5 mg total) by mouth 3 (three) times daily as needed for muscle spasms., Disp: 30 tablet, Rfl: 1 .  ibuprofen (ADVIL,MOTRIN) 200 MG tablet, Take 400 mg by mouth every 8 (eight) hours as needed for mild pain or  moderate pain. , Disp: , Rfl:  .  metoprolol succinate (TOPROL-XL) 25 MG 24 hr tablet, Take 1 tablet (25 mg total) by mouth daily. Take with or immediately following a meal., Disp: 90 tablet, Rfl: 1  EXAM:  VITALS per patient if applicable: Today's Vitals   11/22/19 0813  BP: 115/75  Pulse: (!) 50  Weight: 245 lb (111.1 kg)  Height: 5\' 6"  (1.676 m)   Body mass index is 39.54 kg/m.  Wt Readings from Last 3 Encounters:  11/22/19 245 lb (111.1 kg)  02/06/19 283 lb (128.4 kg)  01/14/19 293 lb 9.6 oz (133.2 kg)       ASSESSMENT AND PLAN:  Discussed the following assessment and plan:  Routine physical examination - Plan: 3D mammogram- MM SCREENING BREAST TOMO BILATERAL  Essential hypertension  Problem List Items Addressed This Visit      Cardiovascular and Mediastinum   Essential hypertension    Low HR , borderline low blood pressure.  Asymptomatic. suspect weight loss contributory. Trial decrease of metoprolol 25mg  QD.  Patient will me know how she is doing      Relevant Medications   metoprolol succinate (TOPROL-XL) 25 MG 24 hr tablet     Other   Routine physical examination - Primary    Due to virtual nature of visit and patient preference, we jointly agreed to perform pap and CPE labs in spring. Ordered mammogram and patient will schedule.  Patient will continue self breast exams at home.  Up-to-date colonoscopy.      Relevant Orders   3D mammogram- MM SCREENING BREAST TOMO BILATERAL      -we discussed possible serious and likely etiologies, options for evaluation and workup, limitations of telemedicine visit vs in person visit, treatment, treatment risks and precautions. Pt prefers to treat via telemedicine empirically rather then risking or undertaking an in person visit at this moment. Patient agrees to seek prompt in person care if worsening, new symptoms arise, or if is not improving with treatment.   I discussed the assessment and treatment plan with the patient. The patient was provided an opportunity to ask questions and all were answered. The patient agreed with the plan and demonstrated an understanding of the instructions.   The patient was advised to call back or seek an in-person evaluation if the symptoms worsen or if the condition fails to improve as anticipated.   Mable Paris, FNP  I spent 20  min non face to face w/ pt.

## 2019-12-09 ENCOUNTER — Encounter: Payer: Self-pay | Admitting: Family

## 2019-12-13 ENCOUNTER — Encounter: Payer: Self-pay | Admitting: Family

## 2019-12-16 DIAGNOSIS — Z08 Encounter for follow-up examination after completed treatment for malignant neoplasm: Secondary | ICD-10-CM | POA: Diagnosis not present

## 2019-12-16 DIAGNOSIS — L905 Scar conditions and fibrosis of skin: Secondary | ICD-10-CM | POA: Diagnosis not present

## 2019-12-16 DIAGNOSIS — Z85828 Personal history of other malignant neoplasm of skin: Secondary | ICD-10-CM | POA: Diagnosis not present

## 2020-01-03 ENCOUNTER — Ambulatory Visit
Admission: RE | Admit: 2020-01-03 | Discharge: 2020-01-03 | Disposition: A | Discharge status: Home / Self Care | Payer: BC Managed Care – PPO | Source: Ambulatory Visit | Attending: Family | Admitting: Family

## 2020-01-03 DIAGNOSIS — Z0000 Encounter for general adult medical examination without abnormal findings: Secondary | ICD-10-CM | POA: Insufficient documentation

## 2020-01-03 DIAGNOSIS — Z1231 Encounter for screening mammogram for malignant neoplasm of breast: Secondary | ICD-10-CM | POA: Diagnosis not present

## 2020-01-23 ENCOUNTER — Encounter: Payer: Self-pay | Admitting: Family

## 2020-01-24 ENCOUNTER — Other Ambulatory Visit: Payer: Self-pay | Admitting: Family

## 2021-01-05 ENCOUNTER — Telehealth: Payer: Self-pay | Admitting: Family

## 2021-01-05 NOTE — Telephone Encounter (Signed)
I scheduled patient f/u since she had not been seen for over a year. I advised we could order mammogram then. She is scheduled 2/25.

## 2021-01-05 NOTE — Telephone Encounter (Signed)
Patient called in need a referral for mammogram for [place]

## 2021-02-02 ENCOUNTER — Other Ambulatory Visit: Payer: Self-pay

## 2021-02-02 ENCOUNTER — Encounter: Payer: Self-pay | Admitting: Family

## 2021-02-02 ENCOUNTER — Ambulatory Visit: Payer: BC Managed Care – PPO | Admitting: Family

## 2021-02-02 VITALS — BP 124/82 | HR 72 | Temp 97.7°F | Ht 64.25 in | Wt 264.4 lb

## 2021-02-02 DIAGNOSIS — M2550 Pain in unspecified joint: Secondary | ICD-10-CM

## 2021-02-02 DIAGNOSIS — Z1231 Encounter for screening mammogram for malignant neoplasm of breast: Secondary | ICD-10-CM | POA: Diagnosis not present

## 2021-02-02 LAB — COMPREHENSIVE METABOLIC PANEL
ALT: 19 U/L (ref 0–35)
AST: 20 U/L (ref 0–37)
Albumin: 4.4 g/dL (ref 3.5–5.2)
Alkaline Phosphatase: 48 U/L (ref 39–117)
BUN: 18 mg/dL (ref 6–23)
CO2: 28 mEq/L (ref 19–32)
Calcium: 9.5 mg/dL (ref 8.4–10.5)
Chloride: 105 mEq/L (ref 96–112)
Creatinine, Ser: 0.67 mg/dL (ref 0.40–1.20)
GFR: 95.43 mL/min (ref >60.00)
Glucose, Bld: 99 mg/dL (ref 70–99)
Potassium: 4.4 mEq/L (ref 3.5–5.1)
Sodium: 141 mEq/L (ref 135–145)
Total Bilirubin: 0.6 mg/dL (ref 0.2–1.2)
Total Protein: 7 g/dL (ref 6.0–8.3)

## 2021-02-02 LAB — CBC WITH DIFFERENTIAL/PLATELET
Basophils Absolute: 0 10*3/uL (ref 0.0–0.1)
Basophils Relative: 1 % (ref 0.0–3.0)
Eosinophils Absolute: 0.1 10*3/uL (ref 0.0–0.7)
Eosinophils Relative: 4.6 % (ref 0.0–5.0)
HCT: 41.1 % (ref 36.0–46.0)
Hemoglobin: 14.1 g/dL (ref 12.0–15.0)
Lymphocytes Relative: 39.8 % (ref 12.0–46.0)
Lymphs Abs: 1.2 10*3/uL (ref 0.7–4.0)
MCHC: 34.3 g/dL (ref 30.0–36.0)
MCV: 91.8 fl (ref 78.0–100.0)
Monocytes Absolute: 0.3 10*3/uL (ref 0.1–1.0)
Monocytes Relative: 9.9 % (ref 3.0–12.0)
Neutro Abs: 1.3 10*3/uL — ABNORMAL LOW (ref 1.4–7.7)
Neutrophils Relative %: 44.7 % (ref 43.0–77.0)
Platelets: 183 10*3/uL (ref 150.0–400.0)
RBC: 4.48 Mil/uL (ref 3.87–5.11)
RDW: 13 % (ref 11.5–15.5)
WBC: 3 10*3/uL — ABNORMAL LOW (ref 4.0–10.5)

## 2021-02-02 LAB — LIPID PANEL
Cholesterol: 184 mg/dL (ref 0–200)
HDL: 63.1 mg/dL (ref >39.00)
LDL Cholesterol: 109 mg/dL — ABNORMAL HIGH (ref 0–99)
NonHDL: 120.97
Total CHOL/HDL Ratio: 3
Triglycerides: 59 mg/dL (ref 0.0–149.0)
VLDL: 11.8 mg/dL (ref 0.0–40.0)

## 2021-02-02 LAB — VITAMIN D 25 HYDROXY (VIT D DEFICIENCY, FRACTURES): VITD: 45.68 ng/mL (ref 30.00–100.00)

## 2021-02-02 LAB — HEMOGLOBIN A1C: Hgb A1c MFr Bld: 5.6 % (ref 4.6–6.5)

## 2021-02-02 LAB — TSH: TSH: 1.31 u[IU]/mL (ref 0.35–4.50)

## 2021-02-02 NOTE — Patient Instructions (Signed)
Please call  and schedule your 3D mammogram as discussed.   [place]  [facility], [place]   Referral for physical therapy at [facility]. Let us know if you dont hear back within a week in regards to an appointment being scheduled.

## 2021-02-02 NOTE — Progress Notes (Signed)
Subjective:    Patient ID: Miranda Aguilar, female    DOB: 1961/02/24, 60 y.o.   MRN: [id number]  CC: Miranda Aguilar is a 60 y.o. female who presents today for follow up after two years.   HPI: Complains of stiffness of low back with first onset 2 years ago. Pain is not worse. She has periods of flare up and then resolves on its own. She likes to have flexeril 5 prn qhs which resolves pain.   Ibuprofen, biofreeze and hot shower are helpful as well. Pain radiates to both buttocks Thinks sitting at computer for long periods exacerbates.  No saddle anesthesia , numbness in legs, trouble urinating or having bowel movement.   No h/o  cancer.   Right anterior knee pain for 7 months , comes and goes. Knee is sore with stairs.  NO giving way, stiffness,erythema, swelling.    HISTORY:  Past Medical History:  Diagnosis Date  . Basal cell carcinoma 2019   nose  . Chicken pox   . Complication of anesthesia    bradycardia (low 40's)during colonoscopy  . Constipation    Occasionally  . 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 GERD (gastroesophageal reflux disease)    Heartburn  . Hypertension    01/2015  . Obesity    Past Surgical History:  Procedure Laterality Date  . COLONOSCOPY N/A 05/22/2015   Procedure: COLONOSCOPY;  Surgeon: Miranda Aguilar;  [facility];  Service: Endoscopy;  Laterality: N/A;  8:30 AM  . INSERTION OF MESH  06/20/2017   Procedure: INSERTION OF MESH;  Surgeon: Miranda Lye, Miranda Aguilar;  Location: [facility];  Service: General;;  umbilical   . LAPAROTOMY N/A 02/01/2015   Procedure: EXPLORATORY LAPAROTOMY, MECKEL'S DIVERTICULECTOMY;  Surgeon: Miranda Aguilar, Miranda Aguilar;  Location: [facility];  Service: General;  Laterality: N/A;  . TONSILLECTOMY    . UMBILICAL HERNIA REPAIR N/A 06/20/2017   Procedure: HERNIA REPAIR UMBILICAL ADULT;  Surgeon: Miranda Lye, Miranda Aguilar;  Location: [facility];  Service: General;  Laterality: N/A;  . WISDOM TOOTH EXTRACTION     Family History   Problem Relation Age of Onset  . Diabetes Mother   . Hyperlipidemia Mother   . Heart disease Mother        CHF  . Cervical cancer Mother   . Stroke Father   . Diabetes Sister   . Osteoporosis Sister   . Diabetes Brother   . Breast cancer Paternal Aunt 96    Allergies: Patient has no known allergies. Current Outpatient Medications on File Prior to Visit  Medication Sig Dispense Refill  . Cholecalciferol (VITAMIN D) 2000 units CAPS Take 2,000 Units by mouth daily.    . cyclobenzaprine (FLEXERIL) 5 MG tablet Take 1 tablet (5 mg total) by mouth 3 (three) times daily as needed for muscle spasms. 30 tablet 1  . ibuprofen (ADVIL,MOTRIN) 200 MG tablet Take 400 mg by mouth every 8 (eight) hours as needed for mild pain or moderate pain.      No current facility-administered medications on file prior to visit.    Social History   Tobacco Use  . Smoking status: Never Smoker  . Smokeless tobacco: Never Used  Vaping Use  . Vaping Use: Never used  Substance Use Topics  . Alcohol use: Yes    Alcohol/week: 5.0 standard drinks    Types: 5 Glasses of wine per week    Comment: 5-6 glasses a week  . Drug use: No  Review of Systems  Constitutional: Negative for chills and fever.  Respiratory: Negative for cough.   Cardiovascular: Negative for chest pain and palpitations.  Gastrointestinal: Negative for nausea and vomiting.  Genitourinary: Negative for difficulty urinating.  Musculoskeletal: Positive for arthralgias and back pain.  Neurological: Negative for dizziness, numbness and headaches.      Objective:    BP 124/82   Pulse 72   Temp 97.7 F (36.5 C)   Ht 5' 4.25" (1.632 m)   Wt 264 lb 6.4 oz (119.9 kg)   LMP 12/16/2017 (Approximate)   SpO2 98%   BMI 45.03 kg/m  BP Readings from Last 3 Encounters:  02/02/21 124/82  11/22/19 115/75  02/06/19 134/76   Wt Readings from Last 3 Encounters:  02/02/21 264 lb 6.4 oz (119.9 kg)  11/22/19 245 lb (111.1 kg)  02/06/19 283 lb  (128.4 kg)    Physical Exam Vitals reviewed.  Constitutional:      Appearance: She is well-developed and well-nourished.  Eyes:     Conjunctiva/sclera: Conjunctivae normal.  Cardiovascular:     Rate and Rhythm: Normal rate and regular rhythm.     Pulses: Normal pulses.     Heart sounds: Normal heart sounds.  Pulmonary:     Effort: Pulmonary effort is normal.     Breath sounds: Normal breath sounds. No wheezing, rhonchi or rales.  Musculoskeletal:     Lumbar back: No swelling, edema, pain, spasms, tenderness or bony tenderness. Normal range of motion.     Right knee: Normal. No swelling or bony tenderness. Normal range of motion.     Left knee: Normal. No swelling or bony tenderness. Normal range of motion.     Comments: Full range of motion with flexion, tension, lateral side bends. No bony tenderness. No pain, numbness, tingling elicited with single leg raise bilaterally.   Skin:    General: Skin is warm and dry.  Neurological:     Mental Status: She is alert.     Sensory: No sensory deficit.     Deep Tendon Reflexes: Strength normal.     Reflex Scores:      Patellar reflexes are 2+ on the right side and 2+ on the left side.    Comments: Sensation and strength intact bilateral lower extremities.  Psychiatric:        Mood and Affect: Mood and affect normal.        Speech: Speech normal.        Behavior: Behavior normal.        Thought Content: Thought content normal.        Assessment & Plan:   Problem List Items Addressed This Visit      Other   Arthralgia - Primary    Benign exam however presentation supports DDD of lumbar spine and likely arthritic changes of right knee as well. Reasonable to continue conservative therapy. Flexeril as needed. Trial of PT. Patient declines baseline xrays today.       Relevant Orders   TSH   CBC with Differential/Platelet   Comprehensive metabolic panel   Hemoglobin A1c   Lipid panel   VITAMIN D 25 Hydroxy (Vit-D Deficiency,  Fractures)   Ambulatory referral to Physical Therapy    Other Visit Diagnoses    Encounter for screening mammogram for malignant neoplasm of breast       Relevant Orders   MM 3D SCREEN BREAST BILATERAL       I am having Miranda Aguilar. Graven maintain her ibuprofen, Vitamin  D, and cyclobenzaprine.   No orders of the defined types were placed in this encounter.   Return precautions given.   Risks, benefits, and alternatives of the medications and treatment plan prescribed today were discussed, and patient expressed understanding.   Education regarding symptom management and diagnosis given to patient on AVS.  Continue to follow with Burnard Hawthorne, FNP for routine health maintenance.   Miranda Aguilar and I agreed with plan.   Mable Paris, FNP

## 2021-02-02 NOTE — Assessment & Plan Note (Addendum)
Benign exam however presentation supports DDD of lumbar spine and likely arthritic changes of right knee as well. Reasonable to continue conservative therapy. Flexeril as needed. Trial of PT. Patient declines baseline xrays today.

## 2021-02-06 ENCOUNTER — Other Ambulatory Visit: Payer: Self-pay

## 2021-02-06 DIAGNOSIS — R7989 Other specified abnormal findings of blood chemistry: Secondary | ICD-10-CM

## 2021-02-06 NOTE — Addendum Note (Signed)
Addended by: Cheri Rous E on: 02/06/2021 02:27 PM   Modules accepted: Orders

## 2021-02-07 ENCOUNTER — Telehealth: Payer: Self-pay | Admitting: Family

## 2021-02-07 NOTE — Telephone Encounter (Signed)
lft vm for pt to call ofc regarding referral to PT.

## 2021-02-26 ENCOUNTER — Ambulatory Visit
Admission: RE | Admit: 2021-02-26 | Discharge: 2021-02-26 | Disposition: A | Discharge status: Home / Self Care | Payer: BC Managed Care – PPO | Source: Ambulatory Visit | Attending: Family | Admitting: Family

## 2021-02-26 ENCOUNTER — Other Ambulatory Visit: Payer: Self-pay

## 2021-02-26 DIAGNOSIS — Z1231 Encounter for screening mammogram for malignant neoplasm of breast: Secondary | ICD-10-CM | POA: Diagnosis not present

## 2021-03-06 DIAGNOSIS — M5451 Vertebrogenic low back pain: Secondary | ICD-10-CM | POA: Diagnosis not present

## 2021-03-06 DIAGNOSIS — M25561 Pain in right knee: Secondary | ICD-10-CM | POA: Diagnosis not present

## 2021-03-14 DIAGNOSIS — M5451 Vertebrogenic low back pain: Secondary | ICD-10-CM | POA: Diagnosis not present

## 2021-03-14 DIAGNOSIS — M25561 Pain in right knee: Secondary | ICD-10-CM | POA: Diagnosis not present

## 2021-03-19 ENCOUNTER — Other Ambulatory Visit: Payer: Self-pay

## 2021-03-19 ENCOUNTER — Other Ambulatory Visit (INDEPENDENT_AMBULATORY_CARE_PROVIDER_SITE_OTHER): Payer: BC Managed Care – PPO

## 2021-03-19 DIAGNOSIS — R7989 Other specified abnormal findings of blood chemistry: Secondary | ICD-10-CM | POA: Diagnosis not present

## 2021-03-19 LAB — CBC WITH DIFFERENTIAL/PLATELET
Basophils Absolute: 0 10*3/uL (ref 0.0–0.1)
Basophils Relative: 1.3 % (ref 0.0–3.0)
Eosinophils Absolute: 0.1 10*3/uL (ref 0.0–0.7)
Eosinophils Relative: 4.4 % (ref 0.0–5.0)
HCT: 40.6 % (ref 36.0–46.0)
Hemoglobin: 13.7 g/dL (ref 12.0–15.0)
Lymphocytes Relative: 40.5 % (ref 12.0–46.0)
Lymphs Abs: 1.3 10*3/uL (ref 0.7–4.0)
MCHC: 33.8 g/dL (ref 30.0–36.0)
MCV: 92.5 fl (ref 78.0–100.0)
Monocytes Absolute: 0.3 10*3/uL (ref 0.1–1.0)
Monocytes Relative: 8.8 % (ref 3.0–12.0)
Neutro Abs: 1.5 10*3/uL (ref 1.4–7.7)
Neutrophils Relative %: 45 % (ref 43.0–77.0)
Platelets: 184 10*3/uL (ref 150.0–400.0)
RBC: 4.39 Mil/uL (ref 3.87–5.11)
RDW: 13.1 % (ref 11.5–15.5)
WBC: 3.3 10*3/uL — ABNORMAL LOW (ref 4.0–10.5)

## 2021-03-20 ENCOUNTER — Telehealth: Payer: Self-pay

## 2021-03-20 DIAGNOSIS — M5451 Vertebrogenic low back pain: Secondary | ICD-10-CM | POA: Diagnosis not present

## 2021-03-20 DIAGNOSIS — M25561 Pain in right knee: Secondary | ICD-10-CM | POA: Diagnosis not present

## 2021-03-20 NOTE — Telephone Encounter (Signed)
LM that since she has read results on mychart that all she needed to do was schedule CBC in 6 weeks unless she had questions.

## 2021-03-20 NOTE — Telephone Encounter (Signed)
LMTCB in regards to lab results.  

## 2021-03-21 ENCOUNTER — Other Ambulatory Visit: Payer: Self-pay

## 2021-03-21 DIAGNOSIS — R7989 Other specified abnormal findings of blood chemistry: Secondary | ICD-10-CM

## 2021-03-21 NOTE — Telephone Encounter (Signed)
Have ordered and noted on patient's next appointment notes to recheck CBC.

## 2021-03-21 NOTE — Telephone Encounter (Signed)
Patient returned office phone call and said she can do her labs when she sees Arnett in May. Note there are no labs ordered.

## 2021-03-27 DIAGNOSIS — M5451 Vertebrogenic low back pain: Secondary | ICD-10-CM | POA: Diagnosis not present

## 2021-03-27 DIAGNOSIS — M25561 Pain in right knee: Secondary | ICD-10-CM | POA: Diagnosis not present

## 2021-04-04 DIAGNOSIS — M5451 Vertebrogenic low back pain: Secondary | ICD-10-CM | POA: Diagnosis not present

## 2021-04-04 DIAGNOSIS — M25561 Pain in right knee: Secondary | ICD-10-CM | POA: Diagnosis not present

## 2021-04-05 ENCOUNTER — Telehealth: Payer: Self-pay | Admitting: Family

## 2021-04-05 NOTE — Telephone Encounter (Signed)
lft vm for to call ofc to follow up on PT referral. thanks

## 2021-05-02 ENCOUNTER — Ambulatory Visit (INDEPENDENT_AMBULATORY_CARE_PROVIDER_SITE_OTHER): Payer: BC Managed Care – PPO | Admitting: Family

## 2021-05-02 ENCOUNTER — Encounter: Payer: Self-pay | Admitting: Family

## 2021-05-02 ENCOUNTER — Other Ambulatory Visit (HOSPITAL_COMMUNITY)
Admission: RE | Admit: 2021-05-02 | Discharge: 2021-05-02 | Disposition: A | Discharge status: Home / Self Care | Payer: BC Managed Care – PPO | Source: Ambulatory Visit | Attending: Family | Admitting: Family

## 2021-05-02 ENCOUNTER — Other Ambulatory Visit: Payer: Self-pay

## 2021-05-02 VITALS — BP 130/90 | HR 76 | Temp 97.6°F | Ht 65.0 in | Wt 260.8 lb

## 2021-05-02 DIAGNOSIS — M2550 Pain in unspecified joint: Secondary | ICD-10-CM

## 2021-05-02 DIAGNOSIS — Z0000 Encounter for general adult medical examination without abnormal findings: Secondary | ICD-10-CM | POA: Diagnosis not present

## 2021-05-02 NOTE — Patient Instructions (Addendum)
It is imperative that you are seen AT least twice per year for labs and monitoring. Monitor blood pressure at home and me 5-6 reading on separate days. Goal is less than 120/80, based on newest guidelines, however we certainly want to be less than 130/80;  if persistently higher, please make sooner follow up appointment so we can recheck you blood pressure and manage/ adjust medications.  Always nice to see you!   Health Maintenance for Postmenopausal Women Menopause is a normal process in which your ability to get pregnant comes to an end. This process happens slowly over many months or years, usually between the ages of 9 and 27. Menopause is complete when you have missed your menstrual periods for 12 months. It is important to talk with your health care provider about some of the most common conditions that affect women after menopause (postmenopausal women). These include heart disease, cancer, and bone loss (osteoporosis). Adopting a healthy lifestyle and getting preventive care can help to promote your health and wellness. The actions you take can also lower your chances of developing some of these common conditions. What should I know about menopause? During menopause, you may get a number of symptoms, such as:  Hot flashes. These can be moderate or severe.  Night sweats.  Decrease in sex drive.  Mood swings.  Headaches.  Tiredness.  Irritability.  Memory problems.  Insomnia. Choosing to treat or not to treat these symptoms is a decision that you make with your health care provider. Do I need hormone replacement therapy?  Hormone replacement therapy is effective in treating symptoms that are caused by menopause, such as hot flashes and night sweats.  Hormone replacement carries certain risks, especially as you become older. If you are thinking about using estrogen or estrogen with progestin, discuss the benefits and risks with your health care provider. What is my risk for  heart disease and stroke? The risk of heart disease, heart attack, and stroke increases as you age. One of the causes may be a change in the body's hormones during menopause. This can affect how your body uses dietary fats, triglycerides, and cholesterol. Heart attack and stroke are medical emergencies. There are many things that you can do to help prevent heart disease and stroke. Watch your blood pressure  High blood pressure causes heart disease and increases the risk of stroke. This is more likely to develop in people who have high blood pressure readings, are of African descent, or are overweight.  Have your blood pressure checked: ? Every 3-5 years if you are 67-27 years of age. ? Every year if you are 3 years old or older. Eat a healthy diet  Eat a diet that includes plenty of vegetables, fruits, low-fat dairy products, and lean protein.  Do not eat a lot of foods that are high in solid fats, added sugars, or sodium.   Get regular exercise Get regular exercise. This is one of the most important things you can do for your health. Most adults should:  Try to exercise for at least 150 minutes each week. The exercise should increase your heart rate and make you sweat (moderate-intensity exercise).  Try to do strengthening exercises at least twice each week. Do these in addition to the moderate-intensity exercise.  Spend less time sitting. Even light physical activity can be beneficial. Other tips  Work with your health care provider to achieve or maintain a healthy weight.  Do not use any products that contain nicotine or tobacco,  such as cigarettes, e-cigarettes, and chewing tobacco. If you need help quitting, ask your health care provider.  Know your numbers. Ask your health care provider to check your cholesterol and your blood sugar (glucose). Continue to have your blood tested as directed by your health care provider. Do I need screening for cancer? Depending on your health  history and family history, you may need to have cancer screening at different stages of your life. This may include screening for:  Breast cancer.  Cervical cancer.  Lung cancer.  Colorectal cancer. What is my risk for osteoporosis? After menopause, you may be at increased risk for osteoporosis. Osteoporosis is a condition in which bone destruction happens more quickly than new bone creation. To help prevent osteoporosis or the bone fractures that can happen because of osteoporosis, you may take the following actions:  If you are 46-45 years old, get at least 1,000 mg of calcium and at least 600 mg of vitamin D per day.  If you are older than age 61 but younger than age 75, get at least 1,200 mg of calcium and at least 600 mg of vitamin D per day.  If you are older than age 88, get at least 1,200 mg of calcium and at least 800 mg of vitamin D per day. Smoking and drinking excessive alcohol increase the risk of osteoporosis. Eat foods that are rich in calcium and vitamin D, and do weight-bearing exercises several times each week as directed by your health care provider. How does menopause affect my mental health? Depression may occur at any age, but it is more common as you become older. Common symptoms of depression include:  Low or sad mood.  Changes in sleep patterns.  Changes in appetite or eating patterns.  Feeling an overall lack of motivation or enjoyment of activities that you previously enjoyed.  Frequent crying spells. Talk with your health care provider if you think that you are experiencing depression. General instructions See your health care provider for regular wellness exams and vaccines. This may include:  Scheduling regular health, dental, and eye exams.  Getting and maintaining your vaccines. These include: ? Influenza vaccine. Get this vaccine each year before the flu season begins. ? Pneumonia vaccine. ? Shingles vaccine. ? Tetanus, diphtheria, and  pertussis (Tdap) booster vaccine. Your health care provider may also recommend other immunizations. Tell your health care provider if you have ever been abused or do not feel safe at home. Summary  Menopause is a normal process in which your ability to get pregnant comes to an end.  This condition causes hot flashes, night sweats, decreased interest in sex, mood swings, headaches, or lack of sleep.  Treatment for this condition may include hormone replacement therapy.  Take actions to keep yourself healthy, including exercising regularly, eating a healthy diet, watching your weight, and checking your blood pressure and blood sugar levels.  Get screened for cancer and depression. Make sure that you are up to date with all your vaccines. This information is not intended to replace advice given to you by your health care provider. Make sure you discuss any questions you have with your health care provider. Document Revised: 11/18/2018 Document Reviewed: 11/18/2018 Elsevier Patient Education  2021 [employer].

## 2021-05-02 NOTE — Assessment & Plan Note (Signed)
CBE and pap smear performed. Advised to monitor blood pressure and call me with  Home readings.

## 2021-05-02 NOTE — Assessment & Plan Note (Signed)
Improved. She declines further evaluation at this time.

## 2021-05-02 NOTE — Progress Notes (Signed)
Subjective:    Patient ID: Miranda Aguilar, female    DOB: [birth year], 60 y.o.   MRN: [id number]  CC: Miranda Aguilar is a 60 y.o. female who presents today for physical exam.    HPI: Feels well today No complaints No depression  Back pain and knee pain much improved to resolved with PT and exercises.     Colorectal Cancer Screening: UTD  Breast Cancer Screening: Mammogram UTD Cervical Cancer Screening: due ; mother had cervical cancer. No pelvic pain Bone Health screening/DEXA for 65+: No increased fracture risk. Defer screening at this time.  Lung Cancer Screening: Doesn't have 20 year pack year history and age > 71 years yo 17 years          Tetanus - utd         Labs: Screening labs done prior.  Exercise: Gets regular exercise.   Alcohol use:  5-6 drinks per week Smoking/tobacco use: Nonsmoker.     HISTORY:  Past Medical History:  Diagnosis Date  . Basal cell carcinoma 2019   nose  . Chicken pox   . Complication of anesthesia    bradycardia (low 40's)during colonoscopy  . Constipation    Occasionally  . 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 GERD (gastroesophageal reflux disease)    Heartburn  . Hypertension    01/2015  . Obesity     Past Surgical History:  Procedure Laterality Date  . COLONOSCOPY N/A 05/22/2015   Procedure: COLONOSCOPY;  Surgeon: Danie Binder, MD;  [facility];  Service: Endoscopy;  Laterality: N/A;  8:30 AM  . INSERTION OF MESH  06/20/2017   Procedure: INSERTION OF MESH;  Surgeon: Christene Lye, MD;  Location: [facility];  Service: General;;  umbilical   . LAPAROTOMY N/A 02/01/2015   Procedure: EXPLORATORY LAPAROTOMY, MECKEL'S DIVERTICULECTOMY;  Surgeon: Jamesetta So, MD;  Location: [facility];  Service: General;  Laterality: N/A;  . TONSILLECTOMY    . UMBILICAL HERNIA REPAIR N/A 06/20/2017   Procedure: HERNIA REPAIR UMBILICAL ADULT;  Surgeon: Christene Lye, MD;  Location: [facility];  Service: General;  Laterality: N/A;   . WISDOM TOOTH EXTRACTION     Family History  Problem Relation Age of Onset  . Diabetes Mother   . Hyperlipidemia Mother   . Heart disease Mother        CHF  . Cervical cancer Mother   . Stroke Father   . Diabetes Sister   . Osteoporosis Sister   . Diabetes Brother   . Breast cancer Paternal Aunt 96      ALLERGIES: Patient has no known allergies.  Current Outpatient Medications on File Prior to Visit  Medication Sig Dispense Refill  . Cholecalciferol (VITAMIN D) 2000 units CAPS Take 2,000 Units by mouth daily.    . cyclobenzaprine (FLEXERIL) 5 MG tablet Take 1 tablet (5 mg total) by mouth 3 (three) times daily as needed for muscle spasms. 30 tablet 1  . ibuprofen (ADVIL,MOTRIN) 200 MG tablet Take 400 mg by mouth every 8 (eight) hours as needed for mild pain or moderate pain.      No current facility-administered medications on file prior to visit.    Social History   Tobacco Use  . Smoking status: Never Smoker  . Smokeless tobacco: Never Used  Vaping Use  . Vaping Use: Never used  Substance Use Topics  . Alcohol use: Yes    Alcohol/week: 5.0 standard drinks    Types: 5 Glasses of  wine per week    Comment: 5-6 glasses a week  . Drug use: No    Review of Systems  Constitutional: Negative for chills, fever and unexpected weight change.  HENT: Negative for congestion.   Respiratory: Negative for cough.   Cardiovascular: Negative for chest pain, palpitations and leg swelling.  Gastrointestinal: Negative for nausea and vomiting.  Musculoskeletal: Negative for arthralgias, back pain (resolved) and myalgias.  Skin: Negative for rash.  Neurological: Negative for headaches.  Hematological: Negative for adenopathy.  Psychiatric/Behavioral: Negative for confusion.      Objective:    BP 130/90 (BP Location: Left Arm, Patient Position: Sitting, Cuff Size: Large)   Pulse 76   Temp 97.6 F (36.4 C) (Oral)   Ht 5\' 5"  (1.651 m)   Wt 260 lb 12.8 oz (118.3 kg)   LMP  12/16/2017 (Approximate)   SpO2 97%   BMI 43.40 kg/m   BP Readings from Last 3 Encounters:  05/02/21 130/90  02/02/21 124/82  11/22/19 115/75   Wt Readings from Last 3 Encounters:  05/02/21 260 lb 12.8 oz (118.3 kg)  02/02/21 264 lb 6.4 oz (119.9 kg)  11/22/19 245 lb (111.1 kg)    Physical Exam Vitals reviewed.  Constitutional:      Appearance: She is well-developed.  Eyes:     Conjunctiva/sclera: Conjunctivae normal.  Neck:     Thyroid: No thyroid mass or thyromegaly.  Cardiovascular:     Rate and Rhythm: Normal rate and regular rhythm.     Pulses: Normal pulses.     Heart sounds: Normal heart sounds.  Pulmonary:     Effort: Pulmonary effort is normal.     Breath sounds: Normal breath sounds. No wheezing, rhonchi or rales.  Chest:  Breasts: Breasts are symmetrical.     Right: No inverted nipple, mass, nipple discharge, skin change or tenderness.     Left: No inverted nipple, mass, nipple discharge, skin change or tenderness.    Genitourinary:    Cervix: No cervical motion tenderness, discharge or friability.     Uterus: Not enlarged, not fixed and not tender.      Adnexa:        Right: No mass, tenderness or fullness.         Left: No mass, tenderness or fullness.       Comments: Pap performed. No CMT. Unable to appreciated ovaries. Lymphadenopathy:     Head:     Right side of head: No submental, submandibular, tonsillar, preauricular, posterior auricular or occipital adenopathy.     Left side of head: No submental, submandibular, tonsillar, preauricular, posterior auricular or occipital adenopathy.     Cervical:     Right cervical: No superficial, deep or posterior cervical adenopathy.    Left cervical: No superficial, deep or posterior cervical adenopathy.     Upper Body:     Right upper body: No pectoral adenopathy.     Left upper body: No pectoral adenopathy.  Skin:    General: Skin is warm and dry.  Neurological:     Mental Status: She is alert.   Psychiatric:        Speech: Speech normal.        Behavior: Behavior normal.        Thought Content: Thought content normal.        Assessment & Plan:   Problem List Items Addressed This Visit      Other   Arthralgia    Improved. She declines further evaluation at this  time.       Routine physical examination - Primary    CBE and pap smear performed. Advised to monitor blood pressure and call me with  Home readings.       Relevant Orders   CBC with Differential/Platelet   Cytology - PAP       I am having [name]. Cueva maintain her ibuprofen, Vitamin D, and cyclobenzaprine.   No orders of the defined types were placed in this encounter.   Return precautions given.   Risks, benefits, and alternatives of the medications and treatment plan prescribed today were discussed, and patient expressed understanding.   Education regarding symptom management and diagnosis given to patient on AVS.   Continue to follow with Burnard Hawthorne, FNP for routine health maintenance.   Miranda Aguilar and I agreed with plan.   Mable Paris, FNP

## 2021-05-03 LAB — CBC WITH DIFFERENTIAL/PLATELET
Basophils Absolute: 0.1 10*3/uL (ref 0.0–0.1)
Basophils Relative: 1.2 % (ref 0.0–3.0)
Eosinophils Absolute: 0.1 10*3/uL (ref 0.0–0.7)
Eosinophils Relative: 3.1 % (ref 0.0–5.0)
HCT: 41.6 % (ref 36.0–46.0)
Hemoglobin: 14.8 g/dL (ref 12.0–15.0)
Lymphocytes Relative: 33.5 % (ref 12.0–46.0)
Lymphs Abs: 1.5 10*3/uL (ref 0.7–4.0)
MCHC: 35.5 g/dL (ref 30.0–36.0)
MCV: 91.2 fl (ref 78.0–100.0)
Monocytes Absolute: 0.3 10*3/uL (ref 0.1–1.0)
Monocytes Relative: 7 % (ref 3.0–12.0)
Neutro Abs: 2.5 10*3/uL (ref 1.4–7.7)
Neutrophils Relative %: 55.2 % (ref 43.0–77.0)
Platelets: 211 10*3/uL (ref 150.0–400.0)
RBC: 4.56 Mil/uL (ref 3.87–5.11)
RDW: 12.8 % (ref 11.5–15.5)
WBC: 4.5 10*3/uL (ref 4.0–10.5)

## 2021-05-03 LAB — CYTOLOGY - PAP
Comment: NEGATIVE
Diagnosis: NEGATIVE
High risk HPV: NEGATIVE

## 2021-05-08 ENCOUNTER — Telehealth: Payer: Self-pay | Admitting: Family

## 2021-05-08 NOTE — Telephone Encounter (Signed)
Call pt Dr schmerhorn advised if HPV negative , to repeat pap is one year I have noted in chart

## 2021-05-08 NOTE — Telephone Encounter (Signed)
-----  Message from Boykin Nearing, MD sent at 05/08/2021 10:05 AM EDT ----- Regarding: RE: If high risk Hpv is negative repeat pap in one year ----- Message ----- From: Burnard Hawthorne, FNP Sent: 05/08/2021   9:52 AM EDT To: Gwen Her Schermerhorn, MD  Dr Ouida Sills,  I hope you are well.  You met this patient in 2017 when I referred her for Unexplained endometrial cells on cervical Pap smear. Appears benign endometrial biopsy 10/2016 .  Her  mother had h/o cervical cancer diagnosis.    Her pap this year returned with atrophy. Out of abundance of caution, I want to consult with you regarding management.   I typically advise repeat pap smear in one year for atrophy.  Does she need to return to you?  Joycelyn Schmid, [place]  Cell [phone]

## 2021-05-08 NOTE — Telephone Encounter (Signed)
Left message for patient to return call back.  

## 2021-05-18 NOTE — Telephone Encounter (Signed)
Please call pt again regarding result note

## 2021-05-22 NOTE — Telephone Encounter (Signed)
LMTCB

## 2021-05-30 NOTE — Telephone Encounter (Signed)
Patient advised to repeat PAP one year.

## 2021-09-24 DIAGNOSIS — Z23 Encounter for immunization: Secondary | ICD-10-CM | POA: Diagnosis not present

## 2021-12-28 ENCOUNTER — Encounter: Payer: Self-pay | Admitting: Family

## 2021-12-28 ENCOUNTER — Other Ambulatory Visit: Payer: Self-pay

## 2021-12-28 DIAGNOSIS — Z85828 Personal history of other malignant neoplasm of skin: Secondary | ICD-10-CM

## 2022-01-01 ENCOUNTER — Other Ambulatory Visit: Payer: Self-pay

## 2022-01-01 ENCOUNTER — Encounter: Payer: Self-pay | Admitting: Family

## 2022-01-01 ENCOUNTER — Ambulatory Visit: Payer: BC Managed Care – PPO | Admitting: Family

## 2022-01-01 VITALS — BP 110/78 | HR 77 | Temp 98.5°F | Ht 65.0 in | Wt 275.4 lb

## 2022-01-01 DIAGNOSIS — F418 Other specified anxiety disorders: Secondary | ICD-10-CM | POA: Insufficient documentation

## 2022-01-01 DIAGNOSIS — L989 Disorder of the skin and subcutaneous tissue, unspecified: Secondary | ICD-10-CM

## 2022-01-01 MED ORDER — HYDROXYZINE HCL 10 MG PO TABS: 10.0000 mg | ORAL_TABLET | Freq: Two times a day (BID) | ORAL | 1 refills | Status: DC | PRN

## 2022-01-01 NOTE — Progress Notes (Signed)
Subjective:    Patient ID: Miranda Aguilar, female    DOB: 08-18-1961, 61 y.o.   MRN: [id number]  CC: Miranda Aguilar is a 61 y.o. female who presents today for follow up.   HPI: She scheduled appointment originally due to concern for nasal lesion.  She has appointment with [facility]  Miranda Aguilar 01/21/22.  Area of redness was proximal to prior incision of BCC.   Nasal lesion has improved. Area remains erythematous however no longer scaling.  Area is nontender, nonbleeding.  She also describes today increased anxiety for the past several months to year.  Historically she has traveled with her husband for many years but of late has noticed more anxiety around travel.  Anxiety will also impact her sleep.  She does not feel anxiety is interfering with her work.  She denies anxiety attacks, depression.  She would be more interested in as needed medication       HISTORY:  Past Medical History:  Diagnosis Date   Basal cell carcinoma 2019   nose   Chicken pox    Complication of anesthesia    bradycardia (low 40's)during colonoscopy   Constipation    Occasionally   Gallstone ileus (HCC)    GERD (gastroesophageal reflux disease)    Heartburn   Hypertension    01/2015   Obesity    Past Surgical History:  Procedure Laterality Date   COLONOSCOPY N/A 05/22/2015   Procedure: COLONOSCOPY;  Surgeon: Miranda Binder, MD;  Location: [facility];  Service: Endoscopy;  Laterality: N/A;  8:30 AM   INSERTION OF MESH  06/20/2017   Procedure: INSERTION OF MESH;  Surgeon: Miranda Lye, MD;  Location: [facility];  Service: General;;  umbilical    LAPAROTOMY N/A 02/01/2015   Procedure: EXPLORATORY LAPAROTOMY, MECKEL'S DIVERTICULECTOMY;  Surgeon: Miranda So, MD;  Location: [facility];  Service: General;  Laterality: N/A;   TONSILLECTOMY     UMBILICAL HERNIA REPAIR N/A 06/20/2017   Procedure: HERNIA REPAIR UMBILICAL ADULT;  Surgeon: Miranda Lye, MD;  Location: [facility];   Service: General;  Laterality: N/A;   WISDOM TOOTH EXTRACTION     Family History  Problem Relation Age of Onset   Diabetes Mother    Hyperlipidemia Mother    Heart disease Mother        CHF   Cervical cancer Mother    Stroke Father    Diabetes Sister    Osteoporosis Sister    Diabetes Brother    Breast cancer Paternal Aunt 96    Allergies: Patient has no known allergies. Current Outpatient Medications on File Prior to Visit  Medication Sig Dispense Refill   Cholecalciferol (VITAMIN D) 2000 units CAPS Take 2,000 Units by mouth daily.     cyclobenzaprine (FLEXERIL) 5 MG tablet Take 1 tablet (5 mg total) by mouth 3 (three) times daily as needed for muscle spasms. 30 tablet 1   ibuprofen (ADVIL,MOTRIN) 200 MG tablet Take 400 mg by mouth every 8 (eight) hours as needed for mild pain or moderate pain.      No current facility-administered medications on file prior to visit.    Social History   Tobacco Use   Smoking status: Never   Smokeless tobacco: Never  Vaping Use   Vaping Use: Never used  Substance Use Topics   Alcohol use: Yes    Alcohol/week: 5.0 standard drinks    Types: 5 Glasses of wine per week    Comment: 5-6 glasses  a week   Drug use: No    Review of Systems  Constitutional:  Negative for chills and fever.  Respiratory:  Negative for cough.   Cardiovascular:  Negative for chest pain and palpitations.  Gastrointestinal:  Negative for nausea and vomiting.  Psychiatric/Behavioral:  Positive for sleep disturbance. The patient is nervous/anxious.      Objective:    BP 110/78 (BP Location: Left Arm, Patient Position: Sitting, Cuff Size: Large)    Pulse 77    Temp 98.5 F (36.9 C) (Oral)    Ht 5\' 5"  (1.651 m)    Wt 275 lb 6.4 oz (124.9 kg)    LMP 12/16/2017 (Approximate)    SpO2 98%    BMI 45.83 kg/m  BP Readings from Last 3 Encounters:  01/01/22 110/78  05/02/21 130/90  02/02/21 124/82   Wt Readings from Last 3 Encounters:  01/01/22 275 lb 6.4 oz (124.9 kg)   05/02/21 260 lb 12.8 oz (118.3 kg)  02/02/21 264 lb 6.4 oz (119.9 kg)    Physical Exam Vitals reviewed.  Constitutional:      Appearance: She is well-developed.  HENT:     Head:      Comments: Very small erythematous area on nose as noted on diagram. Noted vascularity. Area is not raised, tender or bleeding. Eyes:     Conjunctiva/sclera: Conjunctivae normal.  Cardiovascular:     Rate and Rhythm: Normal rate and regular rhythm.     Pulses: Normal pulses.     Heart sounds: Normal heart sounds.  Pulmonary:     Effort: Pulmonary effort is normal.     Breath sounds: Normal breath sounds. No wheezing, rhonchi or rales.  Skin:    General: Skin is warm and dry.  Neurological:     Mental Status: She is alert.  Psychiatric:        Speech: Speech normal.        Behavior: Behavior normal.        Thought Content: Thought content normal.       Assessment & Plan:   Problem List Items Addressed This Visit       Musculoskeletal and Integument   Skin lesion    Fortunately skin lesion has improved.  Due to history of basal cell carcinoma, patient and I  jointly agreed to keep upcoming appointment with Miranda. Bea Aguilar for annual surveillance as well as to ensure no biopsy is required.        Other   Situational anxiety - Primary    Situational.  Patient prefers as needed medication.  We discussed trial of Atarax.  Counseled her on this medication being quite sedating.  She will politely declines daily medication such as SSRI or counseling at this time.   will follow      Relevant Medications   hydrOXYzine (ATARAX) 10 MG tablet     I am having Miranda Aguilar start on hydrOXYzine. I am also having her maintain her ibuprofen, Vitamin D, and cyclobenzaprine.   Meds ordered this encounter  Medications   hydrOXYzine (ATARAX) 10 MG tablet    Sig: Take 1 tablet (10 mg total) by mouth 2 (two) times daily as needed for anxiety.    Dispense:  90 tablet    Refill:  1    Order  Specific Question:   Supervising Provider    Answer:   Crecencio Mc [[id number]]    Return precautions given.   Risks, benefits, and alternatives of the medications and treatment plan prescribed  today were discussed, and patient expressed understanding.   Education regarding symptom management and diagnosis given to patient on AVS.  Continue to follow with Burnard Hawthorne, FNP for routine health maintenance.   Miranda Aguilar and I agreed with plan.   Miranda Paris, FNP

## 2022-01-01 NOTE — Assessment & Plan Note (Signed)
Situational.  Patient prefers as needed medication.  We discussed trial of Atarax.  Counseled her on this medication being quite sedating.  She will politely declines daily medication such as SSRI or counseling at this time.   will follow

## 2022-01-01 NOTE — Assessment & Plan Note (Signed)
Fortunately skin lesion has improved.  Due to history of basal cell carcinoma, patient and I  jointly agreed to keep upcoming appointment with Dr. Bea Laura for annual surveillance as well as to ensure no biopsy is required.

## 2022-01-21 ENCOUNTER — Ambulatory Visit: Payer: BC Managed Care – PPO | Admitting: Dermatology

## 2022-01-21 ENCOUNTER — Encounter: Payer: Self-pay | Admitting: Family

## 2022-01-21 ENCOUNTER — Other Ambulatory Visit: Payer: Self-pay

## 2022-01-21 DIAGNOSIS — D489 Neoplasm of uncertain behavior, unspecified: Secondary | ICD-10-CM | POA: Diagnosis not present

## 2022-01-21 DIAGNOSIS — D2239 Melanocytic nevi of other parts of face: Secondary | ICD-10-CM | POA: Diagnosis not present

## 2022-01-21 DIAGNOSIS — L719 Rosacea, unspecified: Secondary | ICD-10-CM

## 2022-01-21 DIAGNOSIS — L821 Other seborrheic keratosis: Secondary | ICD-10-CM

## 2022-01-21 DIAGNOSIS — D1800 Hemangioma unspecified site: Secondary | ICD-10-CM | POA: Diagnosis not present

## 2022-01-21 NOTE — Patient Instructions (Addendum)
Melanoma ABCDEs ? ?Melanoma is the most dangerous type of skin cancer, and is the leading cause of death from skin disease.  You are more likely to develop melanoma if you: ?Have light-colored skin, light-colored eyes, or red or blond hair ?Spend a lot of time in the sun ?Tan regularly, either outdoors or in a tanning bed ?Have had blistering sunburns, especially during childhood ?Have a close family member who has had a melanoma ?Have atypical moles or large birthmarks ? ?Early detection of melanoma is key since treatment is typically straightforward and cure rates are extremely high if we catch it early.  ? ?The first sign of melanoma is often a change in a mole or a new dark spot.  The ABCDE system is a way of remembering the signs of melanoma. ? ?A for asymmetry:  The two halves do not match. ?B for border:  The edges of the growth are irregular. ?C for color:  A mixture of colors are present instead of an even brown color. ?D for diameter:  Melanomas are usually (but not always) greater than 6mm - the size of a pencil eraser. ?E for evolution:  The spot keeps changing in size, shape, and color. ? ?Please check your skin once per month between visits. You can use a small mirror in front and a large mirror behind you to keep an eye on the back side or your body.  ? ?If you see any new or changing lesions before your next follow-up, please call to schedule a visit. ? ?Please continue daily skin protection including broad spectrum sunscreen SPF 30+ to sun-exposed areas, reapplying every 2 hours as needed when you're outdoors.  ? ?Staying in the shade or wearing long sleeves, sun glasses (UVA+UVB protection) and wide brim hats (4-inch brim around the entire circumference of the hat) are also recommended for sun protection.   ? ? ?If You Need Anything After Your Visit ? ?If you have any questions or concerns for your doctor, please call our main line at [phone] and press option 4 to reach your doctor's medical  assistant. If no one answers, please leave a voicemail as directed and we will return your call as soon as possible. Messages left after 4 pm will be answered the following business day.  ? ?You may also send us a message via MyChart. We typically respond to MyChart messages within 1-2 business days. ? ?For prescription refills, please ask your pharmacy to contact our office. Our fax number is [phone]. ? ?If you have an urgent issue when the clinic is closed that cannot wait until the next business day, you can page your doctor at the number below.   ? ?Please note that while we do our best to be available for urgent issues outside of office hours, we are not available 24/7.  ? ?If you have an urgent issue and are unable to reach us, you may choose to seek medical care at your doctor's office, retail clinic, urgent care center, or emergency room. ? ?If you have a medical emergency, please immediately call 911 or go to the emergency department. ? ?Pager Numbers ? ?- Dr. Kowalski: [phone] ? ?- Dr. Moye: [phone] ? ?- Dr. Stewart: [phone] ? ?In the event of inclement weather, please call our main line at [phone] for an update on the status of any delays or closures. ? ?Dermatology Medication Tips: ?Please keep the boxes that topical medications come in in order to help keep track of the instructions   about where and how to use these. Pharmacies typically print the medication instructions only on the boxes and not directly on the medication tubes.  ? ?If your medication is too expensive, please contact our office at [phone] option 4 or send us a message through MyChart.  ? ?We are unable to tell what your co-pay for medications will be in advance as this is different depending on your insurance coverage. However, we may be able to find a substitute medication at lower cost or fill out paperwork to get insurance to cover a needed medication.  ? ?If a prior authorization is required to get your  medication covered by your insurance company, please allow us 1-2 business days to complete this process. ? ?Drug prices often vary depending on where the prescription is filled and some pharmacies may offer cheaper prices. ? ?The website www.goodrx.com contains coupons for medications through different pharmacies. The prices here do not account for what the cost may be with help from insurance (it may be cheaper with your insurance), but the website can give you the price if you did not use any insurance.  ?- You can print the associated coupon and take it with your prescription to the pharmacy.  ?- You may also stop by our office during regular business hours and pick up a GoodRx coupon card.  ?- If you need your prescription sent electronically to a different pharmacy, notify our office through [facility] MyChart or by phone at [phone] option 4. ? ? ? ? ?Si Usted Necesita Algo Despu?s de Su Visita ? ?Tambi?n puede enviarnos un mensaje a trav?s de MyChart. Por lo general respondemos a los mensajes de MyChart en el transcurso de 1 a 2 d?as h?biles. ? ?Para renovar recetas, por favor pida a su farmacia que se ponga en contacto con nuestra oficina. Nuestro n?mero de fax es el [phone]. ? ?Si tiene un asunto urgente cuando la cl?nica est? cerrada y que no puede esperar hasta el siguiente d?a h?bil, puede llamar/localizar a su doctor(a) al n?mero que aparece a continuaci?n.  ? ?Por favor, tenga en cuenta que aunque hacemos todo lo posible para estar disponibles para asuntos urgentes fuera del horario de oficina, no estamos disponibles las 24 horas del d?a, los 7 d?as de la semana.  ? ?Si tiene un problema urgente y no puede comunicarse con nosotros, puede optar por buscar atenci?n m?dica  en el consultorio de su doctor(a), en una cl?nica privada, en un centro de atenci?n urgente o en una sala de emergencias. ? ?Si tiene una emergencia m?dica, por favor llame inmediatamente al 911 o vaya a la sala de  emergencias. ? ?N?meros de b?per ? ?- Dr. Kowalski: [phone] ? ?- Dra. Moye: [phone] ? ?- Dra. Stewart: [phone] ? ?En caso de inclemencias del tiempo, por favor llame a nuestra l?nea principal al [phone] para una actualizaci?n sobre el estado de cualquier retraso o cierre. ? ?Consejos para la medicaci?n en dermatolog?a: ?Por favor, guarde las cajas en las que vienen los medicamentos de uso t?pico para ayudarle a seguir las instrucciones sobre d?nde y c?mo usarlos. Las farmacias generalmente imprimen las instrucciones del medicamento s?lo en las cajas y no directamente en los tubos del medicamento.  ? ?Si su medicamento es muy caro, por favor, p?ngase en contacto con nuestra oficina llamando al [phone] y presione la opci?n 4 o env?enos un mensaje a trav?s de MyChart.  ? ?No podemos decirle cu?l ser? su copago por los medicamentos por adelantado ya que   esto es diferente dependiendo de la cobertura de su seguro. Sin embargo, es posible que podamos encontrar un medicamento sustituto a menor costo o llenar un formulario para que el seguro cubra el medicamento que se considera necesario.  ? ?Si se requiere una autorizaci?n previa para que su compa??a de seguros cubra su medicamento, por favor perm?tanos de 1 a 2 d?as h?biles para completar este proceso. ? ?Los precios de los medicamentos var?an con frecuencia dependiendo del lugar de d?nde se surte la receta y alguna farmacias pueden ofrecer precios m?s baratos. ? ?El sitio web www.goodrx.com tiene cupones para medicamentos de diferentes farmacias. Los precios aqu? no tienen en cuenta lo que podr?a costar con la ayuda del seguro (puede ser m?s barato con su seguro), pero el sitio web puede darle el precio si no utiliz? ning?n seguro.  ?- Puede imprimir el cup?n correspondiente y llevarlo con su receta a la farmacia.  ?- Tambi?n puede pasar por nuestra oficina durante el horario de atenci?n regular y recoger una tarjeta de cupones de GoodRx.  ?- Si  necesita que su receta se env?e electr?nicamente a una farmacia diferente, informe a nuestra oficina a trav?s de MyChart de  o por tel?fono llamando al [phone] y presione la opci?n 4. ? ?

## 2022-01-21 NOTE — Progress Notes (Signed)
° °  New Patient Visit  Subjective  Miranda Aguilar is a 61 y.o. female who presents for the following: New Patient (Initial Visit) (Patient here today as a new patient concering a new scaly area at nose she noticed in January . She reports a few spots at forehead. Patient also reports a spot at right shoulder. Patient has history of a spot at nose treated with ln2 and biopsied. Patient reports spot was never ruled skin cancer.  ). The patient has spots, moles and lesions to be evaluated, some may be new or changing and the patient has concerns that these could be cancer.  The following portions of the chart were reviewed this encounter and updated as appropriate:   Tobacco   Allergies   Meds   Problems   Med Hx   Surg Hx   Fam Hx      Review of Systems:  No other skin or systemic complaints except as noted in HPI or Assessment and Plan.  Objective  Well appearing patient in no apparent distress; mood and affect are within normal limits.  A focused examination was performed including face, right shoulder, back. Relevant physical exam findings are noted in the Assessment and Plan.  face A little pinkness at mid face   Right Ala Nasi        Assessment & Plan  Rosacea face Rosacea is a chronic progressive skin condition usually affecting the face of adults, causing redness and/or acne bumps. It is treatable but not curable. It sometimes affects the eyes (ocular rosacea) as well. It may respond to topical and/or systemic medication and can flare with stress, sun exposure, alcohol, exercise and some foods.  Daily application of broad spectrum spf 30+ sunscreen to face is recommended to reduce flares.  Discussed the treatment option of BBL/laser.  Typically we recommend 1-3 treatment sessions about 5-8 weeks apart for best results.  The patient's condition may require "maintenance treatments" in the future.  The fee for BBL / laser treatments is $350 per treatment session for the whole  face.  A fee can be quoted for other parts of the body. Insurance typically does not pay for BBL/laser treatments and therefore the fee is an out-of-pocket cost.  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 Pt reports biopsy did not show cancer. Area now healed without further growth. Benign-appearing.  Observation.  Call clinic for new or changing lesions.  Recommend daily use of broad spectrum spf 30+ sunscreen to sun-exposed areas.   Melanocytic Nevi - Tan-brown and/or pink-flesh-colored symmetric macules and papules left upper lip - Benign appearing on exam today - Observation - Call clinic for new or changing moles - Recommend daily use of broad spectrum spf 30+ sunscreen to sun-exposed areas.   Hemangiomas - Red papules - Discussed benign nature - Observe - Call for any changes  Seborrheic Keratoses - Stuck-on, waxy, tan-brown papules and/or plaques  - Benign-appearing - Discussed benign etiology and prognosis. - Observe - Call for any changes  Return for 1 year tbse .  IRuthell Rummage, CMA, am acting as scribe for Sarina Ser, MD. Documentation: I have reviewed the above documentation for accuracy and completeness, and I agree with the above.  Sarina Ser, MD

## 2022-01-25 ENCOUNTER — Encounter: Payer: Self-pay | Admitting: Dermatology

## 2022-01-31 ENCOUNTER — Encounter: Payer: Self-pay | Admitting: Family

## 2022-01-31 ENCOUNTER — Other Ambulatory Visit: Payer: Self-pay

## 2022-01-31 DIAGNOSIS — Z1231 Encounter for screening mammogram for malignant neoplasm of breast: Secondary | ICD-10-CM

## 2022-02-08 ENCOUNTER — Encounter: Payer: Self-pay | Admitting: Family

## 2022-02-08 ENCOUNTER — Ambulatory Visit: Payer: BC Managed Care – PPO | Admitting: Family

## 2022-02-08 ENCOUNTER — Other Ambulatory Visit: Payer: Self-pay

## 2022-02-08 ENCOUNTER — Telehealth: Payer: Self-pay | Admitting: Family

## 2022-02-08 ENCOUNTER — Other Ambulatory Visit (HOSPITAL_COMMUNITY)
Admission: RE | Admit: 2022-02-08 | Discharge: 2022-02-08 | Disposition: A | Discharge status: Home / Self Care | Payer: BC Managed Care – PPO | Source: Ambulatory Visit | Attending: Family | Admitting: Family

## 2022-02-08 VITALS — BP 110/70 | HR 75 | Temp 97.9°F | Ht 65.0 in | Wt 276.8 lb

## 2022-02-08 DIAGNOSIS — N939 Abnormal uterine and vaginal bleeding, unspecified: Secondary | ICD-10-CM | POA: Insufficient documentation

## 2022-02-08 LAB — URINALYSIS, ROUTINE W REFLEX MICROSCOPIC
Bilirubin Urine: NEGATIVE
Ketones, ur: NEGATIVE
Leukocytes,Ua: NEGATIVE
Nitrite: NEGATIVE
Specific Gravity, Urine: 1.02 (ref 1.000–1.030)
Total Protein, Urine: NEGATIVE
Urine Glucose: NEGATIVE
Urobilinogen, UA: 0.2 (ref 0.0–1.0)
pH: 5.5 (ref 5.0–8.0)

## 2022-02-08 NOTE — Telephone Encounter (Signed)
Lft pt vm to call ofc to [facility]. thanks ?

## 2022-02-08 NOTE — Progress Notes (Signed)
? ?Subjective:  ? ? Patient ID: Miranda Aguilar, female    DOB: 1961/08/19, 61 y.o.   MRN: [id number] ? ?CC: Miranda Aguilar is a 61 y.o. female who presents today for an acute visit.   ? ?HPI: Complains of vaginal spotting noticed light pink blood seen on toilet paper. Noticed again 3-4 days later with more blood. She wore a panty liner. Noticed at night. No episode in 10 days.  ?Not sexually active.  ? ? ?No rectal bleeding, vaginal odor, vaginal itching, dysuria, pelvic pain.  ? ?History of basal cell carcinoma ? ?Pap smear obtained 9 months ago ,endocervical transformation zone cannot be determined because of atrophy.  Negative malignancy, HPV.  ? ?Previously followed by GYN,Dr Schermerhorn who advised Pap smear in 1 years time ?HISTORY:  ?Past Medical History:  ?Diagnosis Date  ? Basal cell carcinoma 2019  ? nose  ? Chicken pox   ? Complication of anesthesia   ? bradycardia (low 40's)during colonoscopy  ? Constipation   ? Occasionally  ? 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 GERD (gastroesophageal reflux disease)   ? Heartburn  ? Hypertension   ? 01/2015  ? Obesity   ? ?Past Surgical History:  ?Procedure Laterality Date  ? COLONOSCOPY N/A 05/22/2015  ? Procedure: COLONOSCOPY;  Surgeon: Danie Binder, MD;  Location: [facility];  Service: Endoscopy;  Laterality: N/A;  8:30 AM  ? INSERTION OF MESH  06/20/2017  ? Procedure: INSERTION OF MESH;  Surgeon: Christene Lye, MD;  Location: [facility] ORS;  Service: General;;  umbilical   ? LAPAROTOMY N/A 02/01/2015  ? Procedure: EXPLORATORY LAPAROTOMY, MECKEL'S DIVERTICULECTOMY;  Surgeon: Jamesetta So, MD;  Location: [facility];  Service: General;  Laterality: N/A;  ? TONSILLECTOMY    ? UMBILICAL HERNIA REPAIR N/A 06/20/2017  ? Procedure: HERNIA REPAIR UMBILICAL ADULT;  Surgeon: Christene Lye, MD;  Location: [facility] ORS;  Service: General;  Laterality: N/A;  ? WISDOM TOOTH EXTRACTION    ? ?Family History  ?Problem Relation Age of Onset  ? Diabetes Mother   ? Hyperlipidemia  Mother   ? Heart disease Mother   ?     CHF  ? Cervical cancer Mother   ? Stroke Father   ? Diabetes Sister   ? Osteoporosis Sister   ? Diabetes Brother   ? Breast cancer Paternal Aunt 18  ? ? ?Allergies: Patient has no known allergies. ?Current Outpatient Medications on File Prior to Visit  ?Medication Sig Dispense Refill  ? Cholecalciferol (VITAMIN D) 2000 units CAPS Take 2,000 Units by mouth daily.    ? cyclobenzaprine (FLEXERIL) 5 MG tablet Take 1 tablet (5 mg total) by mouth 3 (three) times daily as needed for muscle spasms. 30 tablet 1  ? hydrOXYzine (ATARAX) 10 MG tablet Take 1 tablet (10 mg total) by mouth 2 (two) times daily as needed for anxiety. 90 tablet 1  ? ibuprofen (ADVIL,MOTRIN) 200 MG tablet Take 400 mg by mouth every 8 (eight) hours as needed for mild pain or moderate pain.     ? ?No current facility-administered medications on file prior to visit.  ? ? ?Social History  ? ?Tobacco Use  ? Smoking status: Never  ? Smokeless tobacco: Never  ?Vaping Use  ? Vaping Use: Never used  ?Substance Use Topics  ? Alcohol use: Yes  ?  Alcohol/week: 5.0 standard drinks  ?  Types: 5 Glasses of wine per week  ?  Comment: 5-6 glasses a week  ?  Drug use: No  ? ? ?Review of Systems  ?Constitutional:  Negative for chills and fever.  ?Respiratory:  Negative for cough.   ?Cardiovascular:  Negative for chest pain and palpitations.  ?Gastrointestinal:  Negative for abdominal pain, nausea and vomiting.  ?Genitourinary:  Positive for vaginal bleeding. Negative for difficulty urinating, hematuria, menstrual problem, vaginal discharge and vaginal pain.  ?   ?Objective:  ?  ?BP 110/70 (BP Location: Left Arm, Patient Position: Sitting, Cuff Size: Large)   Pulse 75   Temp 97.9 ?F (36.6 ?C) (Oral)   Ht 5\' 5"  (1.651 m)   Wt 276 lb 12.8 oz (125.6 kg)   LMP 12/16/2017 (Approximate)   SpO2 98%   BMI 46.06 kg/m?  ? ? ?Physical Exam ?Vitals reviewed.  ?Constitutional:   ?   Appearance: She is well-developed.  ?Eyes:  ?    Conjunctiva/sclera: Conjunctivae normal.  ?Cardiovascular:  ?   Rate and Rhythm: Normal rate and regular rhythm.  ?   Pulses: Normal pulses.  ?   Heart sounds: Normal heart sounds.  ?Pulmonary:  ?   Effort: Pulmonary effort is normal.  ?   Breath sounds: Normal breath sounds. No wheezing, rhonchi or rales.  ?Genitourinary: ?   Labia:     ?   Right: No rash, tenderness or lesion.     ?   Left: No rash, tenderness or lesion.   ?   Vagina: No tenderness or bleeding.  ?   Cervix: No cervical motion tenderness, discharge or cervical bleeding.  ?   Uterus: Not enlarged and not tender.   ?   Adnexa:     ?   Right: No mass, tenderness or fullness.      ?   Left: No mass, tenderness or fullness.    ?   Comments: Pap collected today.  No bleeding lesions noted with internal exam.  No tenderness with bimanual exam. ?Skin: ?   General: Skin is warm and dry.  ?Neurological:  ?   Mental Status: She is alert.  ?Psychiatric:     ?   Speech: Speech normal.     ?   Behavior: Behavior normal.     ?   Thought Content: Thought content normal.  ? ? ?   ?Assessment & Plan:  ? ?Problem List Items Addressed This Visit   ? ?  ? Other  ? Vaginal bleeding - Primary  ?  Postmenopausal bleeding.  We collected Pap today.  Pending transvaginal ultrasound.  Based on ultrasound results will confer with GYN,Dr Schermerhorn , if endometrial sampling is appropriate.  She has prior history of endometrial sampling. ?  ?  ? Relevant Orders  ? US PELVIC COMPLETE WITH TRANSVAGINAL  ? Urinalysis, Routine w reflex microscopic  ? Cytology - PAP( [place])  ? ? ? ? ?I am having [name]. Anastos maintain her ibuprofen, Vitamin D, cyclobenzaprine, and hydrOXYzine. ? ? ?No orders of the defined types were placed in this encounter. ? ? ?Return precautions given.  ? ?Risks, benefits, and alternatives of the medications and treatment plan prescribed today were discussed, and patient expressed understanding.  ? ?Education regarding symptom management and  diagnosis given to patient on AVS. ? ?Continue to follow with Burnard Hawthorne, FNP for routine health maintenance.  ? ?Theadore Nan and I agreed with plan.  ? ?Mable Paris, FNP ? ?

## 2022-02-08 NOTE — Assessment & Plan Note (Signed)
Postmenopausal bleeding.  We collected Pap today.  Pending transvaginal ultrasound.  Based on ultrasound results will confer with GYN,Dr Schermerhorn , if endometrial sampling is appropriate.  She has prior history of endometrial sampling. ?

## 2022-02-08 NOTE — Patient Instructions (Signed)
I have ordered a pelvic ultrasound as discussed ?Let us know if you dont hear back within a week in regards to an appointment being scheduled.  ? ?

## 2022-02-12 LAB — CYTOLOGY - PAP
Comment: NEGATIVE
Diagnosis: NEGATIVE
High risk HPV: NEGATIVE

## 2022-02-15 ENCOUNTER — Telehealth: Payer: Self-pay | Admitting: Family

## 2022-02-15 ENCOUNTER — Other Ambulatory Visit: Payer: Self-pay | Admitting: Family

## 2022-02-15 ENCOUNTER — Ambulatory Visit
Admission: RE | Admit: 2022-02-15 | Discharge: 2022-02-15 | Disposition: A | Discharge status: Home / Self Care | Payer: BC Managed Care – PPO | Source: Ambulatory Visit | Attending: Family | Admitting: Family

## 2022-02-15 ENCOUNTER — Other Ambulatory Visit: Payer: Self-pay

## 2022-02-15 DIAGNOSIS — N950 Postmenopausal bleeding: Secondary | ICD-10-CM | POA: Diagnosis not present

## 2022-02-15 DIAGNOSIS — N939 Abnormal uterine and vaginal bleeding, unspecified: Secondary | ICD-10-CM | POA: Insufficient documentation

## 2022-02-15 DIAGNOSIS — N8500 Endometrial hyperplasia, unspecified: Secondary | ICD-10-CM | POA: Diagnosis not present

## 2022-02-15 NOTE — Telephone Encounter (Signed)
Patient returned office phone call for ultrasound results. She stated tha Joycelyn Schmid called her. Please call her at [phone]. ?

## 2022-02-15 NOTE — Telephone Encounter (Addendum)
Miranda Aguilar from [facility] radiology called making sure that we receive the call report for the pelvic for the pt ?

## 2022-02-18 NOTE — Telephone Encounter (Signed)
Spoke to [place] on 02/15/2022  to confirm we had received the report for patient. ?

## 2022-02-19 ENCOUNTER — Telehealth: Payer: Self-pay

## 2022-02-19 NOTE — Telephone Encounter (Signed)
LMTCB to office to get results ?

## 2022-03-04 DIAGNOSIS — N858 Other specified noninflammatory disorders of uterus: Secondary | ICD-10-CM | POA: Diagnosis not present

## 2022-03-04 DIAGNOSIS — N950 Postmenopausal bleeding: Secondary | ICD-10-CM | POA: Diagnosis not present

## 2022-03-04 DIAGNOSIS — N840 Polyp of corpus uteri: Secondary | ICD-10-CM | POA: Diagnosis not present

## 2022-03-11 ENCOUNTER — Ambulatory Visit
Admission: RE | Admit: 2022-03-11 | Discharge: 2022-03-11 | Disposition: A | Discharge status: Home / Self Care | Payer: BC Managed Care – PPO | Source: Ambulatory Visit | Attending: Family | Admitting: Family

## 2022-03-11 DIAGNOSIS — Z1231 Encounter for screening mammogram for malignant neoplasm of breast: Secondary | ICD-10-CM | POA: Diagnosis not present

## 2022-03-13 ENCOUNTER — Other Ambulatory Visit: Payer: Self-pay | Admitting: Obstetrics and Gynecology

## 2022-03-14 NOTE — Progress Notes (Signed)
No note

## 2022-04-08 DIAGNOSIS — R7309 Other abnormal glucose: Secondary | ICD-10-CM | POA: Diagnosis not present

## 2022-04-16 NOTE — H&P (Signed)
Miranda Aguilar is a 60 y.o. female here for Rf Joycelyn Schmid Arnett,FNP-PMB ?pt here for consultation for PMB  ? . 6 weeks ago developed spotting . No pain  ? G0  ?Mother with cervical cancer ?  ?No DM  ?Embx: ?FRAGMENTS OF BENIGN  ?ENDOMETRIAL POLYP AND  ATROPHIC ENDOMETRIUM. NO HYPERPLASIA  ?OR CARCINOMA. ? ?U/s [facility]E: ? ?TRANSABDOMINAL AND TRANSVAGINAL ULTRASOUND OF PELVIS  ? ?TECHNIQUE:  ?Both transabdominal and transvaginal ultrasound examinations of the  ?pelvis were performed. Transabdominal technique was performed for  ?global imaging of the pelvis including uterus, ovaries, adnexal  ?regions, and pelvic cul-de-sac. It was necessary to proceed with  ?endovaginal exam following the transabdominal exam to visualize the  ?uterus, cervix, and ovaries.  ? ?COMPARISON:  None  ? ?FINDINGS:  ?Uterus  ? ?Measurements: 6.4 x 4.0 x 6.0 cm = volume: 79 mL. Slightly  ?retroverted. Heterogeneous myometrium. Otherwise normal morphology  ?without mass.  ? ?Endometrium  ? ?Thickness: 9 mm.  Abnormal, thickened.  No endometrial fluid or mass  ? ?Right ovary  ? ?Not visualized, likely obscured by bowel  ? ?Left ovary  ? ?Not visualized, likely obscured by bowel  ? ?Other findings  ? ?No free pelvic fluid.  No adnexal masses.  ?   ?  ?Past Medical History:  has a past medical history of Anxiety, GERD (gastroesophageal reflux disease), Hypertension, and Umbilical hernia.  ?Past Surgical History:  has a past surgical history that includes other surgery and open umbilical hernia repair . ?Family History: family history includes Heart disease in her mother. ?Social History:  reports that she has never smoked. She has never used smokeless tobacco. She reports that she does not drink alcohol. ?OB/GYN History:  ?        ?OB History   ?  Gravida  ?0  ? Para  ?0  ? Term  ?0  ? Preterm  ?0  ? AB  ?0  ? Living  ?0  ?  ?  SAB  ?0  ? IAB  ?0  ? Ectopic  ?0  ? Molar  ?0  ? Multiple  ?0  ? Live Births  ?0  ?  ?  ?   ?  ?  ?Allergies: has No Known  Allergies. ?Medications: ?  ?Current Outpatient Medications:  ?  cholecalciferol (VITAMIN D3) 1000 unit tablet, Take by mouth., Disp: , Rfl:  ?  cyclobenzaprine (FLEXERIL) 10 MG tablet, Take 10 mg by mouth 3 (three) times daily as needed for Muscle spasms, Disp: , Rfl:  ?  hydrOXYzine HCL (ATARAX) 10 MG tablet, Take by mouth, Disp: , Rfl:  ?  ibuprofen (ADVIL,MOTRIN) 200 MG tablet, Take by mouth., Disp: , Rfl:  ?  metoprolol succinate (TOPROL-XL) 50 MG XL tablet, TAKE 1 TABLET BY MOUTH ONCE DAILY FOLLOWING A MEAL. (Patient not taking: Reported on 03/04/2022), Disp: , Rfl:  ?  omeprazole (PRILOSEC) 20 MG DR capsule, take 1 capsule by mouth once daily (Patient not taking: Reported on 03/04/2022), Disp: , Rfl:  ?  ?Review of Systems: ?General:                      No fatigue or weight loss ?Eyes:                           No vision changes ?Ears:  No hearing difficulty ?Respiratory:                No cough or shortness of breath ?Pulmonary:                  No asthma or shortness of breath ?Cardiovascular:           No chest pain, palpitations, dyspnea on exertion ?Gastrointestinal:          No abdominal bloating, chronic diarrhea, constipations, masses, pain or hematochezia ?Genitourinary:             No hematuria, dysuria, abnormal vaginal discharge, pelvic pain, Menometrorrhagia ?Lymphatic:                   No swollen lymph nodes ?Musculoskeletal:         No muscle weakness ?Neurologic:                  No extremity weakness, syncope, seizure disorder ?Psychiatric:                  No history of depression, delusions or suicidal/homicidal ideation ?  ?  ? Exam:  ?  ?   ?Vitals:  ?  04/17/22  1546  ?BP: 130/80  ?Pulse:    ?  ?  ?Body mass index is 47.55 kg/m?. ?  ?WDWN white/ female in NAD   ?Lungs: CTA  ?CV : RRR without murmur   ?Neck:  no thyromegaly ?Abdomen: soft , no mass, normal active bowel sounds,  non-tender, no rebound tenderness ?Pelvic: tanner stage 5 ,  ?External genitalia:  vulva /labia no lesions ?Urethra: no prolapse ?Vagina: normal physiologic d/c, no blood , limited room tvh , ? LAVH if need be  ?Cervix: no lesions, no cervical motion tenderness   ?Uterus: normal size shape and contour, non-tender ?Adnexa: no mass,  non-tender   ?Rectovaginal:  ?Endometrial biopsy: ?T ?Impression:  ?  ?The encounter diagnosis was PMB (postmenopausal bleeding). ?  ?  ?  ?Plan:  ?  ?Fx D+C , h/s , possible myosure if polyps seen   ? Risks of the procedure discussed with the pt . See [place] notes .  ?  ?  ?No follow-ups on file. ?  ?Caroline Sauger, MD ?  ?  ?

## 2022-04-24 ENCOUNTER — Other Ambulatory Visit: Payer: Self-pay

## 2022-04-24 ENCOUNTER — Other Ambulatory Visit
Admission: RE | Admit: 2022-04-24 | Discharge: 2022-04-24 | Disposition: A | Discharge status: Home / Self Care | Payer: BC Managed Care – PPO | Source: Ambulatory Visit | Attending: Obstetrics and Gynecology | Admitting: Obstetrics and Gynecology

## 2022-04-24 DIAGNOSIS — Z01810 Encounter for preprocedural cardiovascular examination: Secondary | ICD-10-CM | POA: Diagnosis not present

## 2022-04-24 DIAGNOSIS — I10 Essential (primary) hypertension: Secondary | ICD-10-CM

## 2022-04-24 HISTORY — DX: Unspecified osteoarthritis, unspecified site: M19.90

## 2022-04-24 NOTE — Patient Instructions (Addendum)
Your procedure is scheduled on: 05/03/22 - Friday ?Report to the Registration Desk on the 1st floor of the [facility]. ?To find out your arrival time, please call [phone] between 1PM - 3PM on: 05/02/22 - Thursday ?If your arrival time is 6:00 am, do not arrive prior to that time as the [place] entrance doors do not open until 6:00 am. ? ?REMEMBER: ?Instructions that are not followed completely may result in serious medical risk, up to and including death; or upon the discretion of your surgeon and anesthesiologist your surgery may need to be rescheduled. ? ?Do not eat food after midnight the night before surgery.  ?No gum chewing, lozengers or hard candies. ? ?You may however, drink CLEAR liquids up to 2 hours before you are scheduled to arrive for your surgery. Do not drink anything within 2 hours of your scheduled arrival time. ? ?Clear liquids include: ?- water  ?- apple juice without pulp ?- gatorade (not RED colors) ?- black coffee or tea (Do NOT add milk or creamers to the coffee or tea) ?Do NOT drink anything that is not on this list. ? ?In addition, your doctor has ordered for you to drink the provided  ?Ensure Pre-Surgery Clear Carbohydrate Drink  ?Drinking this carbohydrate drink up to two hours before surgery helps to reduce insulin resistance and improve patient outcomes. Please complete drinking 2 hours prior to scheduled arrival time. ? ?TAKE THESE MEDICATIONS THE MORNING OF SURGERY WITH A SIP OF WATER: ? ?- hydrOXYzine (ATARAX) 10 MG tablet if needed ? ? ?One week prior to surgery: ?Stop Anti-inflammatories (NSAIDS) such as Advil, Aleve, Ibuprofen, Motrin, Naproxen, Naprosyn and Aspirin based products such as Excedrin, Goodys Powder, BC Powder. ? ?Stop ANY OVER THE COUNTER supplements until after surgery. ? ?You may take Tylenol if needed for pain up until the day of surgery. ? ?No Alcohol for 24 hours before or after surgery. ? ?No Smoking including e-cigarettes for 24 hours prior to  surgery.  ?No chewable tobacco products for at least 6 hours prior to surgery.  ?No nicotine patches on the day of surgery. ? ?Do not use any "recreational" drugs for at least a week prior to your surgery.  ?Please be advised that the combination of cocaine and anesthesia may have negative outcomes, up to and including death. ?If you test positive for cocaine, your surgery will be cancelled. ? ?On the morning of surgery brush your teeth with toothpaste and water, you may rinse your mouth with mouthwash if you wish. ?Do not swallow any toothpaste or mouthwash. ? ?Do not wear jewelry, make-up, hairpins, clips or nail polish. ? ?Do not wear lotions, powders, or perfumes.  ? ?Do not shave body from the neck down 48 hours prior to surgery just in case you cut yourself which could leave a site for infection.  ?Also, freshly shaved skin may become irritated if using the CHG soap. ? ?Contact lenses, hearing aids and dentures may not be worn into surgery. ? ?Do not bring valuables to the hospital. [facility] is not responsible for any missing/lost belongings or valuables.  ? ?Notify your doctor if there is any change in your medical condition (cold, fever, infection). ? ?Wear comfortable clothing (specific to your surgery type) to the hospital. ? ?After surgery, you can help prevent lung complications by doing breathing exercises.  ?Take deep breaths and cough every 1-2 hours. Your doctor may order a device called an Incentive Spirometer to help you take deep breaths. ?When  coughing or sneezing, hold a pillow firmly against your incision with both hands. This is called ?splinting.? Doing this helps protect your incision. It also decreases belly discomfort. ? ?If you are being admitted to the hospital overnight, leave your suitcase in the car. ?After surgery it may be brought to your room. ? ?If you are being discharged the day of surgery, you will not be allowed to drive home. ?You will need a responsible adult (18 years or  older) to drive you home and stay with you that night.  ? ?If you are taking public transportation, you will need to have a responsible adult (18 years or older) with you. ?Please confirm with your physician that it is acceptable to use public transportation.  ? ?Please call the [place] Dept. at [phone] if you have any questions about these instructions. ? ?Surgery Visitation Policy: ? ?Patients undergoing a surgery or procedure may have two family members or support persons with them as long as the person is not COVID-19 positive or experiencing its symptoms.  ? ?Inpatient Visitation:   ? ?Visiting hours are 7 a.m. to 8 p.m. ?Up to four visitors are allowed at one time in a patient room, including children. The visitors may rotate out with other people during the day. One designated support person (adult) may remain overnight.  ?

## 2022-04-29 ENCOUNTER — Other Ambulatory Visit
Admission: RE | Admit: 2022-04-29 | Discharge: 2022-04-29 | Disposition: A | Discharge status: Home / Self Care | Payer: BC Managed Care – PPO | Source: Ambulatory Visit | Attending: Obstetrics and Gynecology | Admitting: Obstetrics and Gynecology

## 2022-04-29 ENCOUNTER — Encounter: Payer: Self-pay | Admitting: Urgent Care

## 2022-04-29 DIAGNOSIS — I10 Essential (primary) hypertension: Secondary | ICD-10-CM | POA: Diagnosis not present

## 2022-04-29 DIAGNOSIS — Z01818 Encounter for other preprocedural examination: Secondary | ICD-10-CM | POA: Insufficient documentation

## 2022-04-29 LAB — BASIC METABOLIC PANEL
Anion gap: 8 (ref 5–15)
BUN: 17 mg/dL (ref 8–23)
CO2: 26 mmol/L (ref 22–32)
Calcium: 9.2 mg/dL (ref 8.9–10.3)
Chloride: 107 mmol/L (ref 98–111)
Creatinine, Ser: 0.68 mg/dL (ref 0.44–1.00)
GFR, Estimated: >60 mL/min (ref >60)
Glucose, Bld: 105 mg/dL — ABNORMAL HIGH (ref 70–99)
Potassium: 4.1 mmol/L (ref 3.5–5.1)
Sodium: 141 mmol/L (ref 135–145)

## 2022-04-29 LAB — CBC
HCT: 40.1 % (ref 36.0–46.0)
Hemoglobin: 13.6 g/dL (ref 12.0–15.0)
MCH: 31.3 pg (ref 26.0–34.0)
MCHC: 33.9 g/dL (ref 30.0–36.0)
MCV: 92.2 fL (ref 80.0–100.0)
Platelets: 199 10*3/uL (ref 150–400)
RBC: 4.35 MIL/uL (ref 3.87–5.11)
RDW: 12.8 % (ref 11.5–15.5)
WBC: 3.4 10*3/uL — ABNORMAL LOW (ref 4.0–10.5)
nRBC: 0 % (ref 0.0–0.2)

## 2022-05-01 LAB — TYPE AND SCREEN
ABO/RH(D): O POS
Antibody Screen: NEGATIVE

## 2022-05-01 LAB — RPR: RPR Ser Ql: NONREACTIVE

## 2022-05-02 MED ORDER — LACTATED RINGERS IV SOLN: INTRAVENOUS | Status: DC

## 2022-05-02 MED ORDER — CEFAZOLIN SODIUM-DEXTROSE 2-4 GM/100ML-% IV SOLN
2.0000 g | INTRAVENOUS | Status: AC
  Administered 2022-05-03: 2 g via INTRAVENOUS

## 2022-05-02 MED ORDER — FAMOTIDINE 20 MG PO TABS: 20.0000 mg | ORAL_TABLET | Freq: Once | ORAL | Status: DC

## 2022-05-02 MED ORDER — ACETAMINOPHEN 500 MG PO TABS
1000.0000 mg | ORAL_TABLET | ORAL | Status: AC
  Administered 2022-05-03: 1000 mg via ORAL

## 2022-05-02 MED ORDER — ORAL CARE MOUTH RINSE: 15.0000 mL | Freq: Once | OROMUCOSAL | Status: AC

## 2022-05-02 MED ORDER — POVIDONE-IODINE 10 % EX SWAB
2.0000 | Freq: Once | CUTANEOUS | Status: DC
Start: 2022-05-02 — End: 2022-05-03

## 2022-05-02 MED ORDER — GABAPENTIN 300 MG PO CAPS
300.0000 mg | ORAL_CAPSULE | ORAL | Status: AC
  Administered 2022-05-03: 300 mg via ORAL

## 2022-05-02 MED ORDER — CHLORHEXIDINE GLUCONATE 0.12 % MT SOLN
15.0000 mL | Freq: Once | OROMUCOSAL | Status: AC
  Administered 2022-05-03: 15 mL via OROMUCOSAL

## 2022-05-03 ENCOUNTER — Other Ambulatory Visit: Payer: Self-pay

## 2022-05-03 ENCOUNTER — Ambulatory Visit: Payer: BC Managed Care – PPO | Admitting: Certified Registered Nurse Anesthetist

## 2022-05-03 ENCOUNTER — Encounter: Payer: Self-pay | Admitting: Obstetrics and Gynecology

## 2022-05-03 ENCOUNTER — Ambulatory Visit
Admission: RE | Admit: 2022-05-03 | Discharge: 2022-05-03 | Disposition: A | Discharge status: Home / Self Care | Payer: BC Managed Care – PPO | Attending: Obstetrics and Gynecology | Admitting: Obstetrics and Gynecology

## 2022-05-03 ENCOUNTER — Encounter
Admission: RE | Disposition: A | Discharge status: Home / Self Care | Payer: Self-pay | Source: Home / Self Care | Attending: Obstetrics and Gynecology

## 2022-05-03 DIAGNOSIS — Z01818 Encounter for other preprocedural examination: Secondary | ICD-10-CM

## 2022-05-03 DIAGNOSIS — N950 Postmenopausal bleeding: Secondary | ICD-10-CM | POA: Diagnosis not present

## 2022-05-03 DIAGNOSIS — N840 Polyp of corpus uteri: Secondary | ICD-10-CM | POA: Diagnosis not present

## 2022-05-03 DIAGNOSIS — N8500 Endometrial hyperplasia, unspecified: Secondary | ICD-10-CM | POA: Insufficient documentation

## 2022-05-03 DIAGNOSIS — K219 Gastro-esophageal reflux disease without esophagitis: Secondary | ICD-10-CM | POA: Insufficient documentation

## 2022-05-03 DIAGNOSIS — Z6841 Body Mass Index (BMI) 40.0 and over, adult: Secondary | ICD-10-CM | POA: Diagnosis not present

## 2022-05-03 HISTORY — PX: HYSTEROSCOPY WITH D & C: SHX1775

## 2022-05-03 LAB — ABO/RH: ABO/RH(D): O POS

## 2022-05-03 SURGERY — DILATATION AND CURETTAGE /HYSTEROSCOPY
Anesthesia: General

## 2022-05-03 MED ORDER — MIDAZOLAM HCL 2 MG/2ML IJ SOLN
INTRAMUSCULAR | Status: DC | PRN
  Administered 2022-05-03: 2 mg via INTRAVENOUS

## 2022-05-03 MED ORDER — LIDOCAINE HCL (PF) 2 % IJ SOLN
INTRAMUSCULAR | Status: AC
  Filled 2022-05-03: qty 5

## 2022-05-03 MED ORDER — GABAPENTIN 300 MG PO CAPS
ORAL_CAPSULE | ORAL | Status: AC
  Filled 2022-05-03: qty 1

## 2022-05-03 MED ORDER — CHLORHEXIDINE GLUCONATE 0.12 % MT SOLN
OROMUCOSAL | Status: AC
  Filled 2022-05-03: qty 15

## 2022-05-03 MED ORDER — DEXAMETHASONE SODIUM PHOSPHATE 10 MG/ML IJ SOLN
INTRAMUSCULAR | Status: DC | PRN
  Administered 2022-05-03: 5 mg via INTRAVENOUS

## 2022-05-03 MED ORDER — ONDANSETRON HCL 4 MG/2ML IJ SOLN
INTRAMUSCULAR | Status: DC | PRN
  Administered 2022-05-03: 4 mg via INTRAVENOUS

## 2022-05-03 MED ORDER — PROPOFOL 10 MG/ML IV BOLUS
INTRAVENOUS | Status: DC | PRN
Start: 2022-05-03 — End: 2022-05-03
  Administered 2022-05-03: 200 mg via INTRAVENOUS

## 2022-05-03 MED ORDER — PROPOFOL 10 MG/ML IV BOLUS
INTRAVENOUS | Status: AC
  Filled 2022-05-03: qty 40

## 2022-05-03 MED ORDER — MIDAZOLAM HCL 2 MG/2ML IJ SOLN
INTRAMUSCULAR | Status: AC
  Filled 2022-05-03: qty 2

## 2022-05-03 MED ORDER — FENTANYL CITRATE (PF) 100 MCG/2ML IJ SOLN
INTRAMUSCULAR | Status: DC | PRN
  Administered 2022-05-03 (×2): 25 ug via INTRAVENOUS

## 2022-05-03 MED ORDER — EPHEDRINE SULFATE (PRESSORS) 50 MG/ML IJ SOLN
INTRAMUSCULAR | Status: DC | PRN
Start: 2022-05-03 — End: 2022-05-03
  Administered 2022-05-03: 5 mg via INTRAVENOUS
  Administered 2022-05-03: 10 mg via INTRAVENOUS
  Administered 2022-05-03: 5 mg via INTRAVENOUS

## 2022-05-03 MED ORDER — FENTANYL CITRATE (PF) 100 MCG/2ML IJ SOLN: 25.0000 ug | INTRAMUSCULAR | Status: DC | PRN

## 2022-05-03 MED ORDER — LIDOCAINE HCL (CARDIAC) PF 100 MG/5ML IV SOSY
PREFILLED_SYRINGE | INTRAVENOUS | Status: DC | PRN
  Administered 2022-05-03: 100 mg via INTRAVENOUS

## 2022-05-03 MED ORDER — ONDANSETRON HCL 4 MG/2ML IJ SOLN
INTRAMUSCULAR | Status: AC
  Filled 2022-05-03: qty 2

## 2022-05-03 MED ORDER — PROMETHAZINE HCL 25 MG/ML IJ SOLN: 6.2500 mg | INTRAMUSCULAR | Status: DC | PRN

## 2022-05-03 MED ORDER — FENTANYL CITRATE (PF) 100 MCG/2ML IJ SOLN
INTRAMUSCULAR | Status: AC
Start: 2022-05-03 — End: ?
  Filled 2022-05-03: qty 2

## 2022-05-03 MED ORDER — GLYCOPYRROLATE 0.2 MG/ML IJ SOLN
INTRAMUSCULAR | Status: DC | PRN
  Administered 2022-05-03: .2 mg via INTRAVENOUS

## 2022-05-03 MED ORDER — 0.9 % SODIUM CHLORIDE (POUR BTL) OPTIME
TOPICAL | Status: DC | PRN
  Administered 2022-05-03: 3000 mL

## 2022-05-03 MED ORDER — DEXAMETHASONE SODIUM PHOSPHATE 10 MG/ML IJ SOLN
INTRAMUSCULAR | Status: AC
  Filled 2022-05-03: qty 1

## 2022-05-03 MED ORDER — KETOROLAC TROMETHAMINE 30 MG/ML IJ SOLN
INTRAMUSCULAR | Status: AC
  Filled 2022-05-03: qty 1

## 2022-05-03 MED ORDER — ACETAMINOPHEN 500 MG PO TABS
ORAL_TABLET | ORAL | Status: AC
  Filled 2022-05-03: qty 2

## 2022-05-03 MED ORDER — KETOROLAC TROMETHAMINE 30 MG/ML IJ SOLN
INTRAMUSCULAR | Status: DC | PRN
  Administered 2022-05-03: 15 mg via INTRAVENOUS

## 2022-05-03 MED ORDER — CEFAZOLIN SODIUM-DEXTROSE 2-4 GM/100ML-% IV SOLN
INTRAVENOUS | Status: AC
  Filled 2022-05-03: qty 100

## 2022-05-03 SURGICAL SUPPLY — 27 items
BACTOSHIELD CHG 4% 4OZ (MISCELLANEOUS) ×1
BAG INFUSER PRESSURE 100CC (MISCELLANEOUS) ×1 IMPLANT
DEVICE MYOSURE LITE (MISCELLANEOUS) ×1 IMPLANT
DEVICE MYOSURE REACH (MISCELLANEOUS) IMPLANT
DRSG TELFA 3X8 NADH (GAUZE/BANDAGES/DRESSINGS) ×2 IMPLANT
GLOVE SURG SYN 8.0 (GLOVE) ×2 IMPLANT
GLOVE SURG SYN 8.0 PF PI (GLOVE) ×1 IMPLANT
GOWN STRL REUS W/ TWL LRG LVL3 (GOWN DISPOSABLE) ×1 IMPLANT
GOWN STRL REUS W/ TWL XL LVL3 (GOWN DISPOSABLE) ×1 IMPLANT
GOWN STRL REUS W/TWL LRG LVL3 (GOWN DISPOSABLE) ×2
GOWN STRL REUS W/TWL XL LVL3 (GOWN DISPOSABLE) ×2
IV NS 1000ML (IV SOLUTION) ×2
IV NS 1000ML BAXH (IV SOLUTION) ×1 IMPLANT
IV NS IRRIG 3000ML ARTHROMATIC (IV SOLUTION) ×2 IMPLANT
KIT PROCEDURE FLUENT (KITS) ×1 IMPLANT
KIT TURNOVER CYSTO (KITS) ×2 IMPLANT
MANIFOLD NEPTUNE II (INSTRUMENTS) ×2 IMPLANT
PACK DNC HYST (MISCELLANEOUS) IMPLANT
PAD DRESSING TELFA 3X8 NADH (GAUZE/BANDAGES/DRESSINGS) ×2 IMPLANT
PAD OB MATERNITY 4.3X12.25 (PERSONAL CARE ITEMS) ×2 IMPLANT
PAD PREP 24X41 OB/GYN DISP (PERSONAL CARE ITEMS) ×3 IMPLANT
SCRUB CHG 4% DYNA-HEX 4OZ (MISCELLANEOUS) ×1 IMPLANT
SEAL ROD LENS SCOPE MYOSURE (ABLATOR) ×2 IMPLANT
SET CYSTO W/LG BORE CLAMP LF (SET/KITS/TRAYS/PACK) IMPLANT
TOWEL OR 17X26 4PK STRL BLUE (TOWEL DISPOSABLE) ×2 IMPLANT
TUBING CONNECTING 10 (TUBING) IMPLANT
WATER STERILE IRR 500ML POUR (IV SOLUTION) ×2 IMPLANT

## 2022-05-03 NOTE — Anesthesia Procedure Notes (Signed)
Procedure Name: LMA Insertion Date/Time: 05/03/2022 7:38 AM Performed by: Demetrius Charity, CRNA Pre-anesthesia Checklist: Patient identified, Patient being monitored, Timeout performed, Emergency Drugs available and Suction available Patient Re-evaluated:Patient Re-evaluated prior to induction Oxygen Delivery Method: Circle system utilized Preoxygenation: Pre-oxygenation with 100% oxygen Induction Type: IV induction Ventilation: Mask ventilation without difficulty LMA: LMA inserted LMA Size: 3.5 Tube type: Oral Number of attempts: 1 Placement Confirmation: positive ETCO2 and breath sounds checked- equal and bilateral Tube secured with: Tape Dental Injury: Teeth and Oropharynx as per pre-operative assessment

## 2022-05-03 NOTE — Transfer of Care (Signed)
Immediate Anesthesia Transfer of Care Note  Patient: Miranda Aguilar  Procedure(s) Performed: Procedure(s): FRACTIONAL DILATATION AND CURETTAGE Marlene Bast (N/A)  Patient Location: PACU  Anesthesia Type:General  Level of Consciousness: sedated  Airway & Oxygen Therapy: Patient Spontanous Breathing and Patient connected to face mask oxygen  Post-op Assessment: Report given to RN and Post -op Vital signs reviewed and stable  Post vital signs: Reviewed and stable  Last Vitals:  Vitals:   05/03/22 0638 05/03/22 0836  BP: (!) 154/88 131/86  Pulse: 74 94  Resp: 20 19  Temp: 36.4 C (!) 36.2 C  SpO2: 79% 038%    Complications: No apparent anesthesia complications

## 2022-05-03 NOTE — Anesthesia Preprocedure Evaluation (Signed)
Anesthesia Evaluation  Patient identified by MRN, date of birth, ID band Patient awake    Reviewed: Allergy & Precautions, NPO status , Patient's Chart, lab work & pertinent test results  History of Anesthesia Complications (+) history of anesthetic complications (bradycardia during colonoscopy)  Airway Mallampati: III       Dental  (+) Dental Advidsory Given, Caps, Teeth Intact   Pulmonary neg pulmonary ROS,           Cardiovascular Exercise Tolerance: Good hypertension (h/o, but lost weight and no longer), (-) angina(-) Past MI and (-) Cardiac Stents (-) dysrhythmias (-) Valvular Problems/Murmurs     Neuro/Psych PSYCHIATRIC DISORDERS Anxiety negative neurological ROS     GI/Hepatic Neg liver ROS, GERD  Medicated and Controlled,  Endo/Other  neg diabetesMorbid obesity  Renal/GU negative Renal ROS     Musculoskeletal   Abdominal   Peds  Hematology   Anesthesia Other Findings Past Medical History: No date: Arthritis 2019: Basal cell carcinoma     Comment:  nose No date: Chicken pox No date: Complication of anesthesia     Comment:  bradycardia (low 40's)during colonoscopy No date: Constipation     Comment:  Occasionally No date: Gallstone ileus ([facility]) No date: GERD (gastroesophageal reflux disease)     Comment:  Heartburn No date: Hypertension     Comment:  01/2015 - lost weight No date: Obesity   Reproductive/Obstetrics negative OB ROS                             Anesthesia Physical  Anesthesia Plan  ASA: 3  Anesthesia Plan: General   Post-op Pain Management:    Induction: Intravenous  PONV Risk Score and Plan: 4 or greater and Ondansetron, Dexamethasone, Propofol, Midazolam and Scopolamine patch - Pre-op  Airway Management Planned: LMA and Oral ETT  Additional Equipment:   Intra-op Plan:   Post-operative Plan: Extubation in OR  Informed Consent: I have reviewed the  patients History and Physical, chart, labs and discussed the procedure including the risks, benefits and alternatives for the proposed anesthesia with the patient or authorized representative who has indicated his/her understanding and acceptance.       Plan Discussed with:   Anesthesia Plan Comments:         Anesthesia Quick Evaluation

## 2022-05-03 NOTE — Brief Op Note (Signed)
05/03/2022  8:25 AM  PATIENT:  Miranda Aguilar  61 y.o. female  PRE-OPERATIVE DIAGNOSIS:  post menopausal bleeding, endometrial polyp  POST-OPERATIVE DIAGNOSIS:  post menopausal bleeding, endometrial polyp  PROCEDURE: Fractional D+C , hysteroscopy with myosure resection of multiple endometrial polyps     SURGEON:  Surgeon(s) and Role:    * Dallon Dacosta, Gwen Her, MD - Primary  PHYSICIAN ASSISTANT: cst  ASSISTANTS: none   ANESTHESIA:    LMA  EBL:  2 mL  UO 200 cc IUOF 600 cc  BLOOD ADMINISTERED:none  DRAINS: none   LOCAL MEDICATIONS USED:  NONE  SPECIMEN:  Source of Specimen:  ecc, endometrial curetting and endometrial polyps   DISPOSITION OF SPECIMEN:  PATHOLOGY  COUNTS:  YES  TOURNIQUET:  * No tourniquets in log *  DICTATION: .Other Dictation: Dictation Number verbal  PLAN OF CARE: Discharge to home after PACU  PATIENT DISPOSITION:  PACU - hemodynamically stable.   Delay start of Pharmacological VTE agent (>24hrs) due to surgical blood loss or risk of bleeding: not applicable

## 2022-05-03 NOTE — Anesthesia Postprocedure Evaluation (Signed)
Anesthesia Post Note  Patient: Miranda Aguilar  Procedure(s) Performed: FRACTIONAL DILATATION AND CURETTAGE Marlene Bast  Patient location during evaluation: PACU Anesthesia Type: General Level of consciousness: awake and alert Pain management: pain level controlled Vital Signs Assessment: post-procedure vital signs reviewed and stable Respiratory status: spontaneous breathing, nonlabored ventilation, respiratory function stable and patient connected to nasal cannula oxygen Cardiovascular status: blood pressure returned to baseline and stable Postop Assessment: no apparent nausea or vomiting Anesthetic complications: no   No notable events documented.   Last Vitals:  Vitals:   05/03/22 0853 05/03/22 0901  BP: 136/78 (!) 147/86  Pulse: 80 77  Resp: 12 16  Temp: (!) 36.1 C (!) 36.2 C  SpO2: 96% 99%    Last Pain:  Vitals:   05/03/22 0901  TempSrc: Temporal  PainSc: 2                  Martha Clan

## 2022-05-03 NOTE — Op Note (Unsigned)
NAMESAMANI, DEAL MEDICAL RECORD NO: [id number] ACCOUNT NO: [id number] DATE OF BIRTH: [birth year] FACILITY: [facility] LOCATION: [facility] PHYSICIAN: Boykin Nearing, MD  Operative Report   DATE OF PROCEDURE: 05/03/2022   PREOPERATIVE DIAGNOSIS:   Postmenopausal bleeding.   POSTOPERATIVE DIAGNOSES:   1.  Postmenopausal bleeding.  2.  Endometrial polyps.  PROCEDURES:   1.  Fractional dilation and curettage. 2.  Hysteroscopy with MyoSure endometrial resection of endometrial polyps.  ANESTHESIA:  LMA.  SURGEON:  Boykin Nearing, MD  INDICATIONS:  A 61 year old female with postmenopausal bleeding.  Endometrial biopsy shows polypoid fragments of benign tissue.  The patient continues to have postmenopausal bleeding.  DESCRIPTION OF PROCEDURE:  After adequate LMA anesthesia, the patient was placed in dorsal supine position with the legs in the [place] stirrups.  Lower abdomen, perineum and vagina were prepped and draped in normal sterile fashion.  IV Ancef was  administered for surgical prophylaxis before.  Timeout was performed.  Straight catheterization of the bladder yielded 200 mL clear urine.  A weighted speculum was placed in the posterior vaginal vault and the anterior cervix was grasped with a single  tooth tenaculum.  An endocervical curettage was performed followed by dilation of the cervix to #17 Hanks dilator.  Hysteroscope was advanced into the endometrial cavity with visualization of multiple endometrial polyps.  The MyoSure resectoscope was  then brought up to the operative field and all 4 polyps were resected without difficulty.  Good hemostasis was noted.  Net deficit of fluid was 0.  Hysteroscope and the MyoSure were removed from the endometrial cavity.  Endometrial curettings was then performed *** with scant additional tissue.  Good hemostasis was noted.  The procedure was terminated.  There were no  complications.  ESTIMATED BLOOD LOSS:   Minimal.  INTRAOPERATIVE FLUIDS:  600 mL.  URINE OUTPUT:  200 mL.   The patient was taken to recovery room in good condition.      Elián.[email or web address] D: 05/03/2022 8:57:20 am T: 05/03/2022 10:09:00 am  JOB: [id number]/ [id number]

## 2022-05-03 NOTE — Progress Notes (Signed)
Pt here for fxd+c , h/s and possible myosure if polyps seen  Labs reviewed all questions nanwered . Proceed

## 2022-05-03 NOTE — Discharge Instructions (Signed)

## 2022-05-07 LAB — SURGICAL PATHOLOGY

## 2022-05-09 DIAGNOSIS — R7309 Other abnormal glucose: Secondary | ICD-10-CM | POA: Diagnosis not present

## 2022-05-17 DIAGNOSIS — N840 Polyp of corpus uteri: Secondary | ICD-10-CM | POA: Diagnosis not present

## 2022-06-08 DIAGNOSIS — R7309 Other abnormal glucose: Secondary | ICD-10-CM | POA: Diagnosis not present

## 2022-07-09 DIAGNOSIS — R7309 Other abnormal glucose: Secondary | ICD-10-CM | POA: Diagnosis not present

## 2022-08-07 ENCOUNTER — Telehealth: Payer: Self-pay | Admitting: Family

## 2022-08-07 ENCOUNTER — Ambulatory Visit: Payer: BC Managed Care – PPO | Admitting: Internal Medicine

## 2022-08-07 ENCOUNTER — Encounter: Payer: Self-pay | Admitting: Internal Medicine

## 2022-08-07 ENCOUNTER — Ambulatory Visit
Admission: RE | Admit: 2022-08-07 | Discharge: 2022-08-07 | Disposition: A | Discharge status: Home / Self Care | Payer: BC Managed Care – PPO | Source: Ambulatory Visit | Attending: Internal Medicine | Admitting: Internal Medicine

## 2022-08-07 VITALS — BP 130/80 | HR 74 | Temp 98.0°F | Ht 64.0 in | Wt 275.4 lb

## 2022-08-07 DIAGNOSIS — R739 Hyperglycemia, unspecified: Secondary | ICD-10-CM | POA: Diagnosis not present

## 2022-08-07 DIAGNOSIS — R1013 Epigastric pain: Secondary | ICD-10-CM

## 2022-08-07 DIAGNOSIS — Z1329 Encounter for screening for other suspected endocrine disorder: Secondary | ICD-10-CM

## 2022-08-07 DIAGNOSIS — R030 Elevated blood-pressure reading, without diagnosis of hypertension: Secondary | ICD-10-CM

## 2022-08-07 DIAGNOSIS — K760 Fatty (change of) liver, not elsewhere classified: Secondary | ICD-10-CM | POA: Diagnosis not present

## 2022-08-07 LAB — CBC WITH DIFFERENTIAL/PLATELET
Basophils Absolute: 0 10*3/uL (ref 0.0–0.1)
Basophils Relative: 0.6 % (ref 0.0–3.0)
Eosinophils Absolute: 0.1 10*3/uL (ref 0.0–0.7)
Eosinophils Relative: 3.2 % (ref 0.0–5.0)
HCT: 41.8 % (ref 36.0–46.0)
Hemoglobin: 14.3 g/dL (ref 12.0–15.0)
Lymphocytes Relative: 27.6 % (ref 12.0–46.0)
Lymphs Abs: 1.1 10*3/uL (ref 0.7–4.0)
MCHC: 34.2 g/dL (ref 30.0–36.0)
MCV: 92.8 fl (ref 78.0–100.0)
Monocytes Absolute: 0.4 10*3/uL (ref 0.1–1.0)
Monocytes Relative: 9.5 % (ref 3.0–12.0)
Neutro Abs: 2.4 10*3/uL (ref 1.4–7.7)
Neutrophils Relative %: 59.1 % (ref 43.0–77.0)
Platelets: 197 10*3/uL (ref 150.0–400.0)
RBC: 4.5 Mil/uL (ref 3.87–5.11)
RDW: 13.2 % (ref 11.5–15.5)
WBC: 4.1 10*3/uL (ref 4.0–10.5)

## 2022-08-07 LAB — COMPREHENSIVE METABOLIC PANEL
ALT: 21 U/L (ref 0–35)
AST: 18 U/L (ref 0–37)
Albumin: 4.3 g/dL (ref 3.5–5.2)
Alkaline Phosphatase: 55 U/L (ref 39–117)
BUN: 17 mg/dL (ref 6–23)
CO2: 25 mEq/L (ref 19–32)
Calcium: 9.2 mg/dL (ref 8.4–10.5)
Chloride: 104 mEq/L (ref 96–112)
Creatinine, Ser: 0.61 mg/dL (ref 0.40–1.20)
GFR: 96.58 mL/min (ref >60.00)
Glucose, Bld: 98 mg/dL (ref 70–99)
Potassium: 4.3 mEq/L (ref 3.5–5.1)
Sodium: 138 mEq/L (ref 135–145)
Total Bilirubin: 0.7 mg/dL (ref 0.2–1.2)
Total Protein: 7.3 g/dL (ref 6.0–8.3)

## 2022-08-07 LAB — TSH: TSH: 1.26 u[IU]/mL (ref 0.35–5.50)

## 2022-08-07 LAB — HEMOGLOBIN A1C: Hgb A1c MFr Bld: 6 % (ref 4.6–6.5)

## 2022-08-07 NOTE — Telephone Encounter (Signed)
Pt will schedule follow up after she has the [place] . Pt stated she want her to have another appt. thanks

## 2022-08-07 NOTE — Progress Notes (Signed)
Chief Complaint  Patient presents with   Abdominal Pain    Pt had severe diarrhea and abdominal pain on Saturday and she is still dealing with pain   F/u  1. S/p hernia repair x 2 last repair with mesh  C/o Epigastic pain mid stomach sore, slight nausea, severe diarrhea started Sunday qhs after eating pork tendoloin, bread, eggplant, applesauce Epigastric pain was gnawling like spasms and pulsating 5/10 Sunday resolved today lasted 7:30 pm to 12 pm Seen by Dr. Shanon Brow in 2020 and no ab pain since then but it is intermittent and comes and goes and he was worried with gallstones Nothing tried for pain   2. Elevated BP repeat better f/u PCP   Review of Systems  Constitutional:  Negative for weight loss.  HENT:  Negative for hearing loss.   Eyes:  Negative for blurred vision.  Respiratory:  Negative for shortness of breath.   Cardiovascular:  Negative for chest pain.  Gastrointestinal:  Positive for abdominal pain, diarrhea and nausea. Negative for blood in stool.  Genitourinary:  Negative for dysuria.  Musculoskeletal:  Negative for falls and joint pain.  Skin:  Negative for rash.  Neurological:  Negative for headaches.  Psychiatric/Behavioral:  Negative for depression.    Past Medical History:  Diagnosis Date   Arthritis    Basal cell carcinoma 2019   nose   Chicken pox    Complication of anesthesia    bradycardia (low 40's)during colonoscopy   Constipation    Occasionally   Gallstone ileus (HCC)    GERD (gastroesophageal reflux disease)    Heartburn   Hypertension    01/2015 - lost weight   Obesity    Past Surgical History:  Procedure Laterality Date   COLONOSCOPY N/A 05/22/2015   Procedure: COLONOSCOPY;  Surgeon: Danie Binder, MD;  [facility];  Service: Endoscopy;  Laterality: N/A;  8:30 AM   HERNIA REPAIR     x 2   HYSTEROSCOPY WITH D & C N/A 05/03/2022   Procedure: FRACTIONAL DILATATION AND CURETTAGE Marlene Bast;  Surgeon: Schermerhorn,  Gwen Her, MD;  Location: [facility];  Service: Gynecology;  Laterality: N/A;   INSERTION OF MESH  06/20/2017   Procedure: INSERTION OF MESH;  Surgeon: Christene Lye, MD;  Location: [facility];  Service: General;;  umbilical    LAPAROTOMY N/A 02/01/2015   Procedure: EXPLORATORY LAPAROTOMY, MECKEL'S DIVERTICULECTOMY;  Surgeon: Jamesetta So, MD;  Location: [facility];  Service: General;  Laterality: N/A;   TONSILLECTOMY     UMBILICAL HERNIA REPAIR N/A 06/20/2017   Procedure: HERNIA REPAIR UMBILICAL ADULT;  Surgeon: Christene Lye, MD;  Location: [facility];  Service: General;  Laterality: N/A;   WISDOM TOOTH EXTRACTION     Family History  Problem Relation Age of Onset   Diabetes Mother    Hyperlipidemia Mother    Heart disease Mother        CHF   Cervical cancer Mother    Stroke Father    Diabetes Sister    Osteoporosis Sister    Diabetes Brother    Breast cancer Paternal Aunt 96   Social History   Socioeconomic History   Marital status: Married    Spouse name: Glendell Docker   Number of children: Not on file   Years of education: Not on file   Highest education level: Not on file  Occupational History   Not on file  Tobacco Use   Smoking status: Never   Smokeless tobacco:  Never  Vaping Use   Vaping Use: Never used  Substance and Sexual Activity   Alcohol use: Yes    Alcohol/week: 5.0 standard drinks of alcohol    Types: 5 Glasses of wine per week    Comment: 5-6 glasses a week   Drug use: No   Sexual activity: Yes    Partners: Male    Comment: Husband   Other Topics Concern   Not on file  Social History Narrative   Works at [employer] husband    No children   2 dogs, 1 cat, All live inside    Right handed    Caffeine- 1 cup of coffee   Enjoys- showing and training dogs - GSP and collie.   Exercise- training dogs for dog show   Social Determinants of Health   Financial Resource Strain: Not on file  Food Insecurity: Not on file   Transportation Needs: Not on file  Physical Activity: Not on file  Stress: Not on file  Social Connections: Not on file  Intimate Partner Violence: Not on file   Current Meds  Medication Sig   Cholecalciferol (VITAMIN D) 2000 units CAPS Take 2,000 Units by mouth daily.   hydrOXYzine (ATARAX) 10 MG tablet Take 1 tablet (10 mg total) by mouth 2 (two) times daily as needed for anxiety.   ibuprofen (ADVIL,MOTRIN) 200 MG tablet Take 400 mg by mouth every 8 (eight) hours as needed for mild pain or moderate pain.    No Known Allergies No results found for this or any previous visit (from the past 2160 hour(s)). Objective  Body mass index is 47.27 kg/m. Wt Readings from Last 3 Encounters:  08/07/22 275 lb 6.4 oz (124.9 kg)  05/03/22 270 lb (122.5 kg)  02/08/22 276 lb 12.8 oz (125.6 kg)   Temp Readings from Last 3 Encounters:  08/07/22 98 F (36.7 C) (Oral)  05/03/22 (!) 97.2 F (36.2 C) (Temporal)  02/08/22 97.9 F (36.6 C) (Oral)   BP Readings from Last 3 Encounters:  08/07/22 130/80  05/03/22 (!) 147/86  02/08/22 110/70   Pulse Readings from Last 3 Encounters:  08/07/22 74  05/03/22 77  02/08/22 75    Physical Exam Vitals and nursing note reviewed.  Constitutional:      Appearance: Normal appearance. She is well-developed and well-groomed.  HENT:     Head: Normocephalic and atraumatic.  Eyes:     Conjunctiva/sclera: Conjunctivae normal.     Pupils: Pupils are equal, round, and reactive to light.  Cardiovascular:     Rate and Rhythm: Normal rate and regular rhythm.     Heart sounds: Normal heart sounds. No murmur heard. Pulmonary:     Effort: Pulmonary effort is normal.     Breath sounds: Normal breath sounds.  Abdominal:     General: Abdomen is flat. Bowel sounds are normal.     Tenderness: There is no abdominal tenderness.  Musculoskeletal:        General: No tenderness.  Skin:    General: Skin is warm and dry.  Neurological:     General: No focal deficit  present.     Mental Status: She is alert and oriented to person, place, and time. Mental status is at baseline.     Cranial Nerves: Cranial nerves 2-12 are intact.     Motor: Motor function is intact.     Coordination: Coordination is intact.     Gait: Gait is intact.  Psychiatric:        Attention and Perception: Attention and perception normal.        Mood and Affect: Mood and affect normal.        Speech: Speech normal.        Behavior: Behavior normal. Behavior is cooperative.        Thought Content: Thought content normal.        Cognition and Memory: Cognition and memory normal.        Judgment: Judgment normal.     Assessment  Plan  Epigastric abdominal pain - Plan: Comprehensive metabolic panel, CBC with Differential/Platelet, Urinalysis, Routine w reflex microscopic, US Abdomen Complete Consider CT ab/pelvis  Consider [place] surgery consult in the future   Hyperglycemia - Plan: Hemoglobin A1c  Thyroid disorder screen - Plan: TSH   Elevated BP  Repeat improved f/u with PCP  Provider: Dr. Olivia Mackie McLean-Scocuzza-[facility]

## 2022-08-07 NOTE — Patient Instructions (Signed)
Cholelithiasis  Cholelithiasis is a disease in which gallstones form in the gallbladder. The gallbladder is an organ that stores bile. Bile is a fluid that helps to digest fats. Gallstones begin as small crystals and can slowly grow into stones. They may cause no symptoms until they block the gallbladder duct, or cystic duct, when the gallbladder tightens (contracts) after food is eaten. This can cause pain and is known as a gallbladder attack, or biliary colic. There are two main types of gallstones: Cholesterol stones. These are the most common type of gallstone. These stones are made of hardened cholesterol and are usually yellow-green in color. Cholesterol is a fat-like substance that is made in the liver. Pigment stones. These are dark in color and are made of a red-yellow substance, called bilirubin,that forms when hemoglobin from red blood cells breaks down. What are the causes? This condition may be caused by an imbalance in the different parts that make bile. This can happen if the bile: Has too much bilirubin. This can happen in certain blood diseases, such as sickle cell anemia. Has too much cholesterol. Does not have enough bile salts. These salts help the body absorb and digest fats. In some cases, this condition can also be caused by the gallbladder not emptying completely or often enough. This is common during pregnancy. What increases the risk? The following factors may make you more likely to develop this condition: Being female. Having multiple pregnancies. Health care providers sometimes advise removing diseased gallbladders before future pregnancies. Eating a diet that is heavy in fried foods, fat, and refined carbohydrates, such as white bread and white rice. Being obese. Being older than age 40. Using medicines that contain female hormones (estrogen) for a long time. Losing weight quickly. Having a family history of gallstones. Having certain medical problems, such  as: Diabetes mellitus. Cystic fibrosis. Crohn's disease. Cirrhosis or other long-term (chronic) liver disease. Certain blood diseases, such as sickle cell anemia or leukemia. What are the signs or symptoms? In many cases, having gallstones causes no symptoms. When you have gallstones but do not have symptoms, you have silent gallstones. If a gallstone blocks your bile duct, it can cause a gallbladder attack. The main symptom of a gallbladder attack is sudden pain in the upper right part of the abdomen. The pain: Usually comes at night or after eating. Can last for one hour or more. Can spread to your right shoulder, back, or chest. Can feel like indigestion. This is discomfort, burning, or fullness in your upper abdomen. If the bile duct is blocked for more than a few hours, it can cause an infection or inflammation of your gallbladder (cholecystitis), liver, or pancreas. This can cause: Nausea or vomiting. Bloating. Pain in your abdomen that lasts for 5 hours or longer. Tenderness in your upper abdomen, often in the upper right section and under your rib cage. Fever or chills. Skin or the white parts of your eyes turning yellow (jaundice). This usually happens when a stone has blocked bile from passing through the common bile duct. Dark urine or light-colored stools. How is this diagnosed? This condition may be diagnosed based on: A physical exam. Your medical history. Ultrasound. CT scan. MRI. You may also have other tests, including: Blood tests to check for signs of an infection or inflammation. Cholescintigraphy, or HIDA scan. This is a scan of your gallbladder and bile ducts (biliary system) using non-harmful radioactive material and special cameras that can see the radioactive material. Endoscopic retrograde cholangiopancreatogram. This   involves inserting a small tube with a camera on the end (endoscope) through your mouth to look at bile ducts and check for blockages. How is  this treated? Treatment for this condition depends on the severity of the condition. Silent gallstones do not need treatment. Treatment may be needed if a blockage causes a gallbladder attack or other symptoms. Treatment may include: Home care, if symptoms are not severe. During a simple gallbladder attack, stop eating and drinking for 12-24 hours (except for water and clear liquids). This helps to "cool down" your gallbladder. After 1 or 2 days, you can start to eat a diet of simple or clear foods, such as broths and crackers. You may also need medicines for pain or nausea or both. If you have cholecystitis and an infection, you will need antibiotics. A hospital stay, if needed for pain control or for cholecystitis with severe infection. Cholecystectomy, or surgery to remove your gallbladder. This is the most common treatment if all other treatments have not worked. Medicines to break up gallstones. These are most effective at treating small gallstones. Medicines may be used for up to 6-12 months. Endoscopic retrograde cholangiopancreatogram. A small basket can be attached to the endoscope and used to capture and remove gallstones, mainly those that are in the common bile duct. Follow these instructions at home: Medicines Take over-the-counter and prescription medicines only as told by your health care provider. If you were prescribed an antibiotic medicine, take it as told by your health care provider. Do not stop taking the antibiotic even if you start to feel better. Ask your health care provider if the medicine prescribed to you requires you to avoid driving or using machinery. Eating and drinking Drink enough fluid to keep your urine pale yellow. This is important during a gallbladder attack. Water and clear liquids are preferred. Follow a healthy diet. This includes: Reducing fatty foods, such as fried food and foods high in cholesterol. Reducing refined carbohydrates, such as white bread  and white rice. Eating more fiber. Aim for foods such as almonds, fruit, and beans. Alcohol use If you drink alcohol: Limit how much you use to: 0-1 drink a day for nonpregnant women. 0-2 drinks a day for men. Be aware of how much alcohol is in your drink. In the [place] one drink equals one 12 oz bottle of beer (355 mL), one 5 oz glass of wine (148 mL), or one 1 oz glass of hard liquor (44 mL). General instructions Do not use any products that contain nicotine or tobacco, such as cigarettes, e-cigarettes, and chewing tobacco. If you need help quitting, ask your health care provider. Maintain a healthy weight. Keep all follow-up visits as told by your health care provider. These may include consultations with a surgeon or specialist. This is important. Where to find more information National Institute of Diabetes and Digestive and Kidney Diseases: www.niddk.nih.gov Contact a health care provider if: You think you have had a gallbladder attack. You have been diagnosed with silent gallstones and you develop pain in your abdomen or indigestion. You begin to have attacks more often. You have dark urine or light-colored stools. Get help right away if: You have pain from a gallbladder attack that lasts for more than 2 hours. You have pain in your abdomen that lasts for more than 5 hours or is getting worse. You have a fever or chills. You have nausea and vomiting that do not go away. You develop jaundice. Summary Cholelithiasis is a disease in which   gallstones form in the gallbladder. This condition may be caused by an imbalance in the different parts that make bile. This can happen if your bile has too much bilirubin or cholesterol, or does not have enough bile salts. Treatment for gallstones depends on the severity of the condition. Silent gallstones do not need treatment. If gallstones cause a gallbladder attack or other symptoms, treatment usually involves not eating or drinking anything.  Treatment may also include pain medicines and antibiotics, and it sometimes includes a hospital stay. Surgery to remove the gallbladder is common if all other treatments have not worked. This information is not intended to replace advice given to you by your health care provider. Make sure you discuss any questions you have with your health care provider. Document Revised: 10/18/2019 Document Reviewed: 10/18/2019 Elsevier Patient Education  [place].  Biliary Colic, Adult  Biliary colic is severe pain caused by a problem with the gallbladder. The gallbladder is a small organ in the upper right part of the abdomen. The gallbladder stores a digestive fluid produced in the liver (bile) that helps the body break down fat. Bile and other digestive enzymes are carried from the liver to the small intestine through tube-like structures called bile ducts. The gallbladder and the bile ducts form the biliary tract. Sometimes, hard deposits of digestive fluids (gallstones) form in the gallbladder and block the flow of bile from the gallbladder, causing biliary colic. This condition is also called a gallbladder attack. Gallstones can be as small as a grain of sand or as big as a golf ball. There could be just one gallstone in the gallbladder, or there could be many. What are the causes? This condition is usually caused by gallstones. Less often, a tumor could block the flow of bile from the gallbladder and trigger biliary colic. What increases the risk? The following factors may make you more likely to develop this condition: Being female. Having a family history of gallstones. Being obese. Losing weight suddenly or quickly. Eating a diet that is high in calories, low in fiber, and rich in refined carbohydrates, such as white bread and white rice. Having certain health conditions, such as: An intestinal disease that affects nutrient absorption, such as Crohn's disease. A metabolic condition, such  as diabetes or metabolic syndrome. Metabolic syndrome occurs when someone has high blood pressure, high cholesterol, and diabetes. A blood condition, such as hemolytic anemia or sickle cell disease. What are the signs or symptoms? The main symptom of this condition is severe pain in the upper right side of the abdomen. You may feel this pain below the chest but above the hip. This pain often occurs at night or after eating a meal that is high in fat. This pain may get worse for up to an hour and last as long as 12 hours. In most cases, the pain fades (subsides) within 2 hours. Other symptoms of this condition include: Nausea and vomiting. Pain under the right shoulder. How is this diagnosed? This condition is diagnosed based on your medical history, your symptoms, and a physical exam. You may also have tests, including: Blood tests to rule out infection or inflammation of the bile ducts, gallbladder, pancreas, or liver. Imaging studies, such as: An ultrasound. A CT scan. An MRI. In some cases, you may need to have an imaging study done using a small amount of radioactive material (nuclear medicine) to confirm the diagnosis. How is this treated? This condition may be treated with medicines to: Relieve  your pain or nausea. Dissolve the gallstones. It may take months or years before the gallstones are completely gone. If you have gallstones, or if you have a tumor in the gallbladder that is causing biliary colic, you may need surgery to remove the gallbladder (cholecystectomy). Follow these instructions at home: Eating and drinking Drink enough fluid to keep your urine pale yellow. Follow instructions from your health care provider about eating or drinking restrictions. These may include avoiding: Fatty, greasy, and fried foods. Any foods that make the pain worse. Overeating. Having a large meal after not eating for a while. General instructions Take over-the-counter and prescription  medicines only as told by your health care provider. Keep all follow-up visits as told by your health care provider. This is important. How is this prevented? Steps to prevent this condition include: Maintaining a healthy body weight. Getting regular exercise. Eating a healthy diet that is high in fiber and low in fat. Limiting how much sugar and refined carbohydrates you eat. Contact a health care provider if: Your pain lasts more than 5 hours. You vomit. You have a fever and chills. Your pain gets worse. Get help right away if: Your skin or the whites of your eyes look yellow (jaundice). Your have tea-colored urine and light-colored stools (feces). You are dizzy or you faint. Summary Biliary colic is severe pain caused by a problem with the gallbladder. The gallbladder is a small organ in the upper right part of your abdomen. Treatment for this condition may include medicine to relieve your pain or nausea, or medicine to slowly dissolve the gallstones. If you have gallstones, or if you have a tumor in the gallbladder that is causing biliary colic, you may need surgery to remove the gallbladder (cholecystectomy). This information is not intended to replace advice given to you by your health care provider. Make sure you discuss any questions you have with your health care provider. Document Revised: 12/07/2019 Document Reviewed: 09/28/2019 Elsevier Patient Education  [place].

## 2022-08-08 LAB — URINALYSIS, ROUTINE W REFLEX MICROSCOPIC
Bacteria, UA: NONE SEEN /HPF
Bilirubin Urine: NEGATIVE
Glucose, UA: NEGATIVE
Hgb urine dipstick: NEGATIVE
Hyaline Cast: NONE SEEN /LPF
Ketones, ur: NEGATIVE
Nitrite: NEGATIVE
Protein, ur: NEGATIVE
RBC / HPF: NONE SEEN /HPF (ref 0–2)
Specific Gravity, Urine: 1.02 (ref 1.001–1.035)
pH: 6 (ref 5.0–8.0)

## 2022-08-08 LAB — MICROSCOPIC MESSAGE

## 2022-08-09 ENCOUNTER — Telehealth: Payer: Self-pay

## 2022-08-09 DIAGNOSIS — R7309 Other abnormal glucose: Secondary | ICD-10-CM | POA: Diagnosis not present

## 2022-08-09 NOTE — Telephone Encounter (Signed)
LMOM for pt CB in regards to labs and a question Dr. Olivia Mackie had for her even though she saw results:   McLean-Scocuzza, Nino Glow, MD  Gracy Racer, CMA Liver kidneys normal  Blood cts normal  +prediabetes  -rec healthy diet and exercise     Thyroid lab normal  Urine cloudy make sure drinking water 55-64 ounces daily   US abdomen  Fatty liver  Gallbladder not completely seen  If having abdominal pain rec CT abdomen pelvis is she agreeable ?

## 2022-08-15 ENCOUNTER — Telehealth: Payer: Self-pay

## 2022-08-15 NOTE — Telephone Encounter (Signed)
LMOM for pt to CB in rewgards to Dr. Audrie Gallus question:  McLean-Scocuzza, Nino Glow, MD  Gracy Racer, CMA Do you want a referral to [facility] clinic surgery for evaluation?

## 2022-08-16 ENCOUNTER — Telehealth: Payer: Self-pay

## 2022-08-16 NOTE — Telephone Encounter (Signed)
LMOM for pt to CB in regards to Dr. Audrie Gallus second question for her this was my 2nd or 3rd attempt:    Miranda Aguilar, [facility]  08/15/2022  8:55 AM EDT Back to Top    [facility] for pt to CB   Delorise Jackson, MD  08/14/2022 12:39 PM EDT     Do you want a referral to [facility] clinic surgery for evaluation?    Miranda Aguilar, [facility]  08/14/2022  9:32 AM EDT     Pt has been notified and has verbalized understanding. Pt stated she is no longer having abdominal pain and does not want a CT done at this time. Pt stated if her pain comes back then she will consider it.    Miranda Aguilar, [place]  08/09/2022 10:05 AM EDT     LMOM for pt to Cb even though pt seen results need to see if she is agreeable to a CT

## 2022-08-17 ENCOUNTER — Encounter: Payer: Self-pay | Admitting: Internal Medicine

## 2022-08-26 NOTE — Telephone Encounter (Signed)
LVM  to call back to office to schedule 3 mth follow up for A1c recheck

## 2022-09-08 DIAGNOSIS — R7309 Other abnormal glucose: Secondary | ICD-10-CM | POA: Diagnosis not present

## 2022-10-03 ENCOUNTER — Ambulatory Visit (INDEPENDENT_AMBULATORY_CARE_PROVIDER_SITE_OTHER): Payer: Self-pay

## 2022-10-03 DIAGNOSIS — Z23 Encounter for immunization: Secondary | ICD-10-CM

## 2022-10-09 DIAGNOSIS — R7309 Other abnormal glucose: Secondary | ICD-10-CM | POA: Diagnosis not present

## 2022-10-20 IMAGING — MG MM DIGITAL SCREENING BILAT W/ TOMO AND CAD
6 of 12 series · 6 of 36 positions shown · non-contrast
Comparison: Previous exam(s).

ACR Breast Density Category a: The breast tissue is almost entirely
fatty.

CLINICAL DATA: Screening.

EXAM:
DIGITAL SCREENING BILATERAL MAMMOGRAM WITH TOMOSYNTHESIS AND CAD
TECHNIQUE: Bilateral screening digital craniocaudal and mediolateral oblique
mammograms were obtained. Bilateral screening digital breast
tomosynthesis was performed. The images were evaluated with
computer-aided detection.

[L CC synth-2D (1 of 2)]
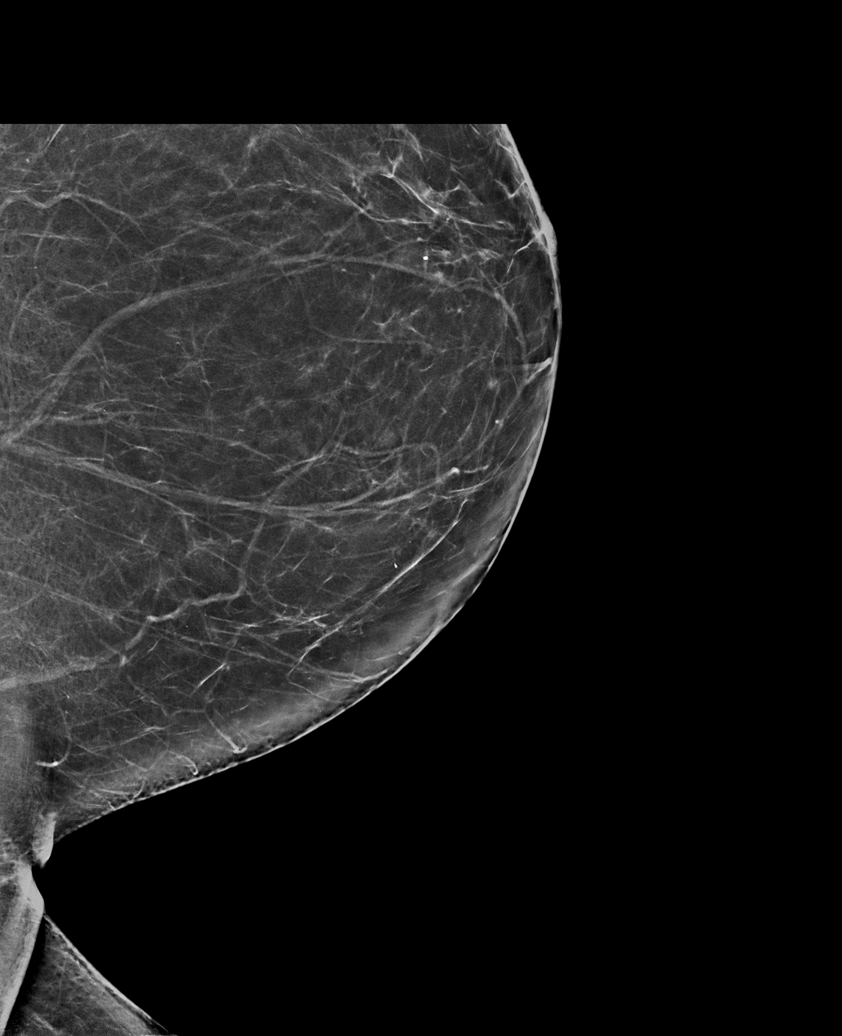

[L CC synth-2D (2 of 2)]
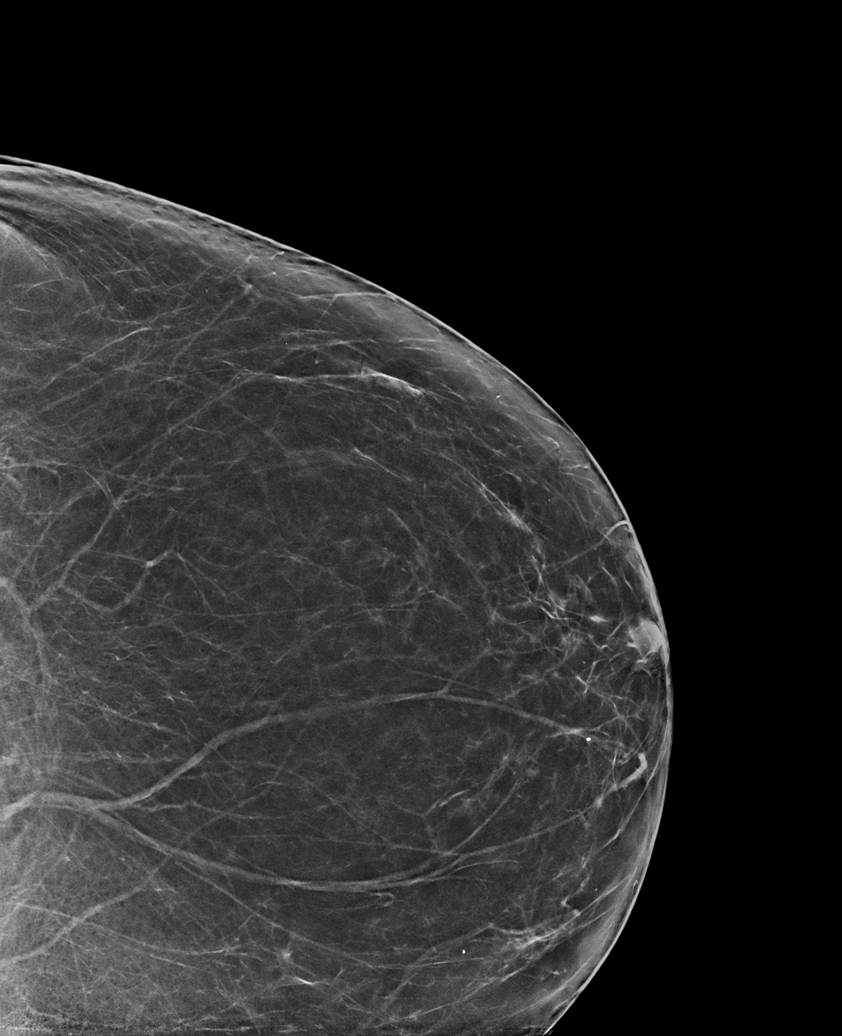

[R CV synth-2D]
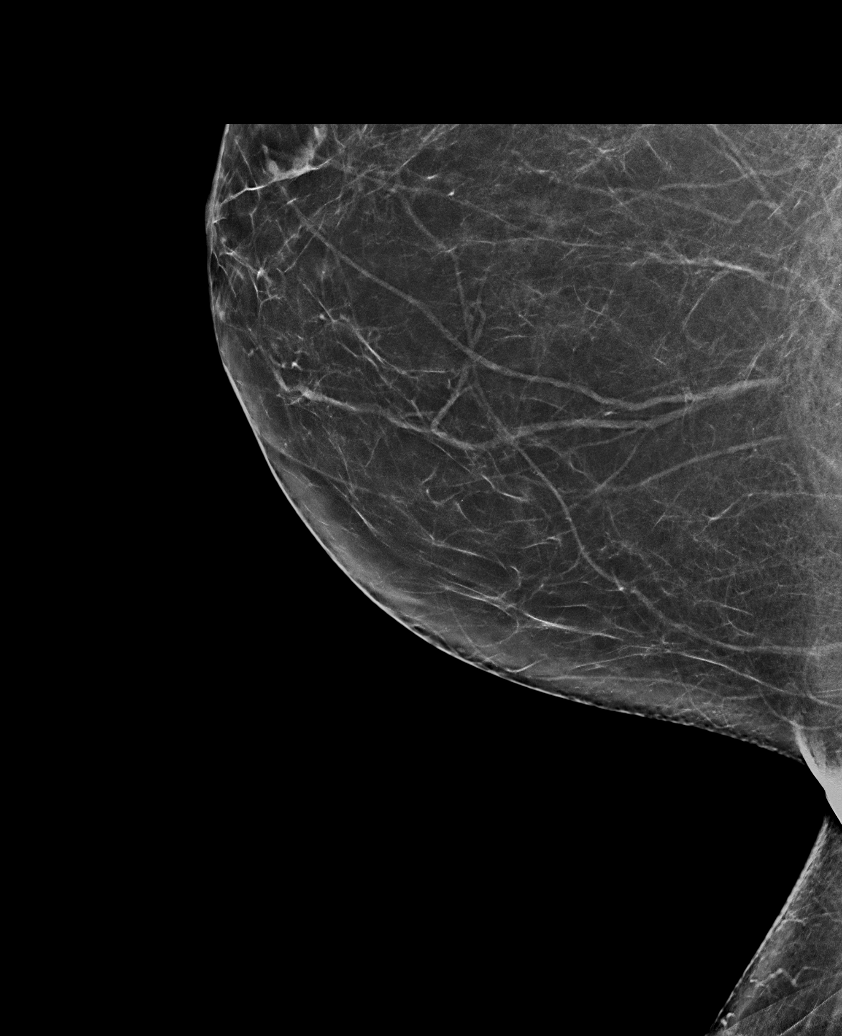

[R MLO synth-2D]
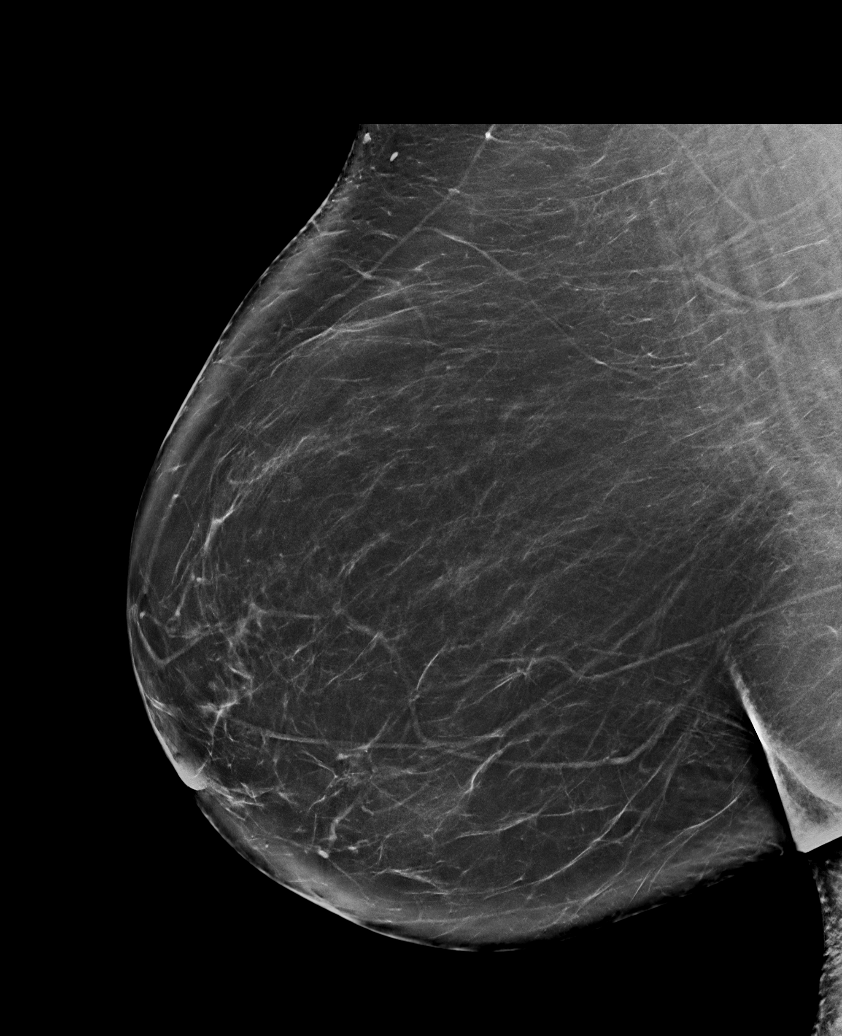

[R CC synth-2D]
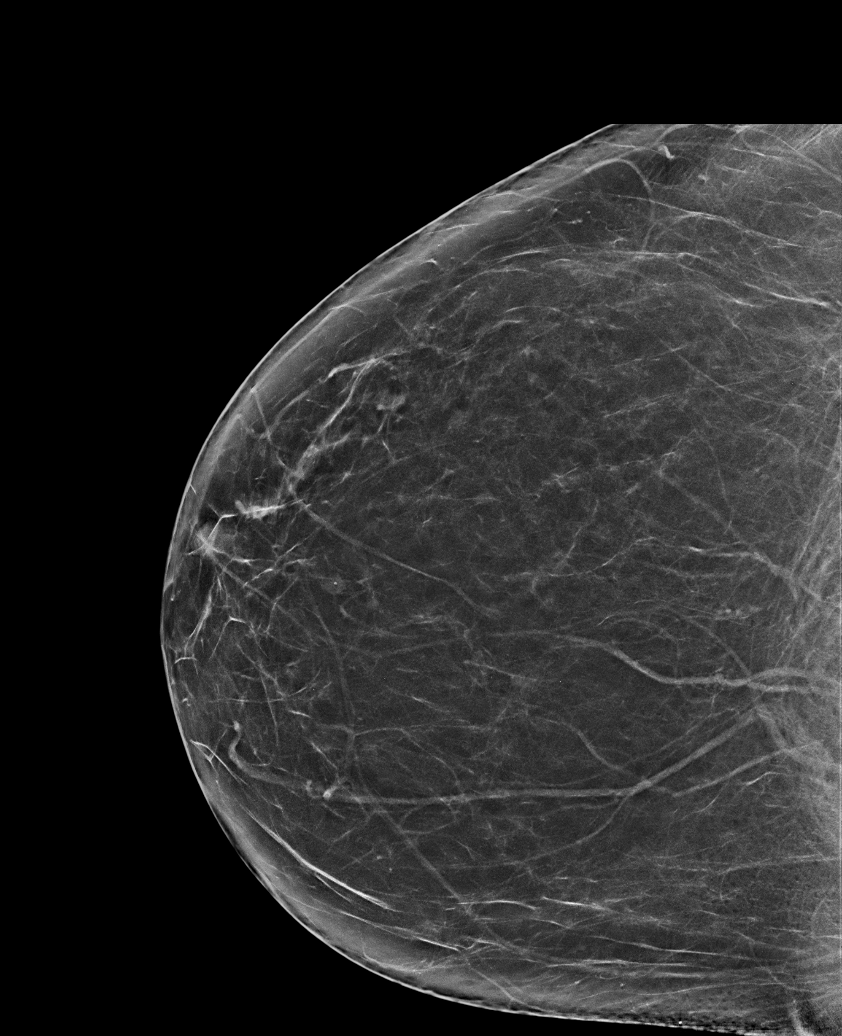

[L MLO synth-2D]
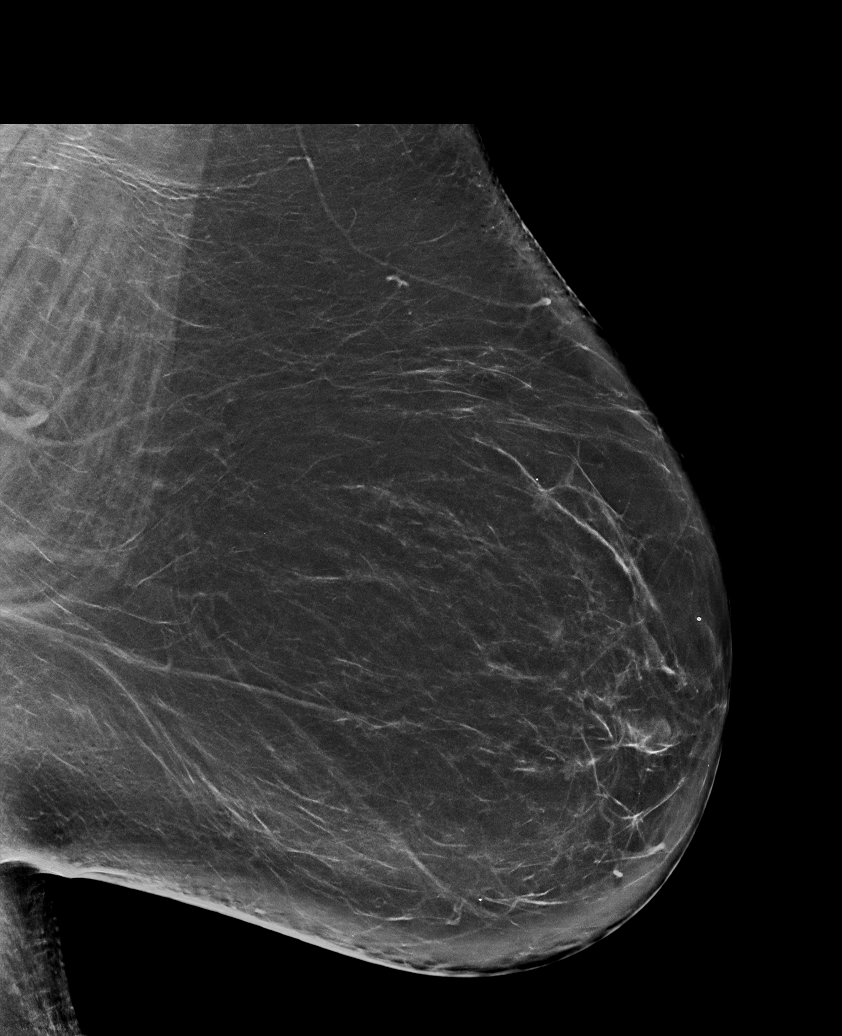

[6 of 36 positions shown; findings below may reference images not displayed]

FINDINGS: There are no findings suspicious for malignancy. The images were
evaluated with computer-aided detection.
IMPRESSION: No mammographic evidence of malignancy. A result letter of this
screening mammogram will be mailed directly to the patient.

RECOMMENDATION:
Screening mammogram in one year. (Code:JP-J-DD5)

BI-RADS CATEGORY  1: Negative.

## 2022-11-08 DIAGNOSIS — R7309 Other abnormal glucose: Secondary | ICD-10-CM | POA: Diagnosis not present

## 2022-11-25 ENCOUNTER — Ambulatory Visit: Payer: BC Managed Care – PPO | Admitting: Family

## 2022-12-09 DIAGNOSIS — R7309 Other abnormal glucose: Secondary | ICD-10-CM | POA: Diagnosis not present

## 2022-12-20 ENCOUNTER — Encounter: Payer: Self-pay | Admitting: Family

## 2022-12-20 ENCOUNTER — Ambulatory Visit: Payer: BC Managed Care – PPO | Admitting: Family

## 2022-12-20 VITALS — BP 134/86 | HR 77 | Temp 98.6°F | Ht 64.0 in | Wt 250.6 lb

## 2022-12-20 DIAGNOSIS — Z1231 Encounter for screening mammogram for malignant neoplasm of breast: Secondary | ICD-10-CM

## 2022-12-20 DIAGNOSIS — K8020 Calculus of gallbladder without cholecystitis without obstruction: Secondary | ICD-10-CM

## 2022-12-20 NOTE — Patient Instructions (Signed)
Please call  and schedule your 3D mammogram  as we discussed.   [facility]  ( new location in 2023)  [address], [place], [place] [address]  [place], [place]

## 2022-12-20 NOTE — Progress Notes (Signed)
Assessment & Plan:  Encounter for screening mammogram for malignant neoplasm of breast -     3D Screening Mammogram, Left and Right; Future  Symptomatic cholelithiasis Assessment & Plan: Epigastric pain completely resolved.  No further episodes.  Reviewed abdominal ultrasound obtained which had suboptimal view of gallbladder.  patient politely declines further investigation including CT ab and/or HIDA scan at this time in the absence of symptoms.  She will continue to monitor closely.       Return precautions given.   Risks, benefits, and alternatives of the medications and treatment plan prescribed today were discussed, and patient expressed understanding.   Education regarding symptom management and diagnosis given to patient on AVS either electronically or printed.  Return in about 4 months (around 04/20/2023).  Mable Paris, FNP  Subjective:    Patient ID: Miranda Aguilar, female    DOB: [birth year], 62 y.o.   MRN: [id number]  CC: Miranda Aguilar is a 62 y.o. female who presents today for follow up.   HPI: Feels well today.  She has a 'little cold' that she is not particularly concerned about.  No fever, shortness of breath.  Endorses postnasal drip, hoarseness.  She has been using Benadryl.   Follow-up abdominal pain last seen for suspected cholelithiasis 07/2022, Dr Aundra Dubin   She has had no episodes of abdominal pain.  She has been very diligent about a low-fat diet and lost weight.   She describes a 'gallbladder attack' every 3-4 years.   History of GERD Never smoker Allergies: Patient has no known allergies. Current Outpatient Medications on File Prior to Visit  Medication Sig Dispense Refill   Cholecalciferol (VITAMIN D) 2000 units CAPS Take 2,000 Units by mouth daily.     hydrOXYzine (ATARAX) 10 MG tablet Take 1 tablet (10 mg total) by mouth 2 (two) times daily as needed for anxiety. 90 tablet 1   ibuprofen (ADVIL,MOTRIN) 200 MG tablet Take 400 mg by mouth  every 8 (eight) hours as needed for mild pain or moderate pain.      Progesterone 100 MG SUPP      No current facility-administered medications on file prior to visit.    Review of Systems  Constitutional:  Negative for chills and fever.  HENT:  Positive for postnasal drip and voice change. Negative for congestion.   Respiratory:  Negative for cough.   Cardiovascular:  Negative for chest pain and palpitations.  Gastrointestinal:  Negative for abdominal pain, nausea and vomiting.      Objective:    BP 134/86   Pulse 77   Temp 98.6 F (37 C) (Oral)   Ht '5\' 4"'$  (1.626 m)   Wt 250 lb 9.6 oz (113.7 kg)   LMP 12/16/2017 (Approximate)   SpO2 98%   BMI 43.02 kg/m   BP Readings from Last 3 Encounters:  12/20/22 134/86  08/07/22 130/80  05/03/22 (!) 147/86   Wt Readings from Last 3 Encounters:  12/20/22 250 lb 9.6 oz (113.7 kg)  08/07/22 275 lb 6.4 oz (124.9 kg)  05/03/22 270 lb (122.5 kg)    Physical Exam Vitals reviewed.  Constitutional:      Appearance: She is well-developed.  HENT:     Head: Normocephalic and atraumatic.     Right Ear: Hearing, tympanic membrane, ear canal and external ear normal. No decreased hearing noted. No drainage, swelling or tenderness. No middle ear effusion. No foreign body. Tympanic membrane is not erythematous or bulging.     Left Ear: Hearing,  tympanic membrane, ear canal and external ear normal. No decreased hearing noted. No drainage, swelling or tenderness.  No middle ear effusion. No foreign body. Tympanic membrane is not erythematous or bulging.     Nose: Nose normal. No rhinorrhea.     Right Sinus: No maxillary sinus tenderness or frontal sinus tenderness.     Left Sinus: No maxillary sinus tenderness or frontal sinus tenderness.     Mouth/Throat:     Pharynx: Uvula midline. No oropharyngeal exudate or posterior oropharyngeal erythema.     Tonsils: No tonsillar abscesses.  Eyes:     Conjunctiva/sclera: Conjunctivae normal.   Cardiovascular:     Rate and Rhythm: Normal rate and regular rhythm.     Pulses: Normal pulses.     Heart sounds: Normal heart sounds.  Pulmonary:     Effort: Pulmonary effort is normal.     Breath sounds: Normal breath sounds. No wheezing, rhonchi or rales.  Lymphadenopathy:     Head:     Right side of head: No submental, submandibular, tonsillar, preauricular, posterior auricular or occipital adenopathy.     Left side of head: No submental, submandibular, tonsillar, preauricular, posterior auricular or occipital adenopathy.     Cervical: No cervical adenopathy.  Skin:    General: Skin is warm and dry.  Neurological:     Mental Status: She is alert.  Psychiatric:        Speech: Speech normal.        Behavior: Behavior normal.        Thought Content: Thought content normal.

## 2022-12-20 NOTE — Assessment & Plan Note (Signed)
Epigastric pain completely resolved.  No further episodes.  Reviewed abdominal ultrasound obtained which had suboptimal view of gallbladder.  patient politely declines further investigation including CT ab and/or HIDA scan at this time in the absence of symptoms.  She will continue to monitor closely.

## 2023-01-09 DIAGNOSIS — R7309 Other abnormal glucose: Secondary | ICD-10-CM | POA: Diagnosis not present

## 2023-01-27 ENCOUNTER — Ambulatory Visit: Payer: BC Managed Care – PPO | Admitting: Dermatology

## 2023-01-27 ENCOUNTER — Encounter: Payer: Self-pay | Admitting: Dermatology

## 2023-01-27 VITALS — BP 129/79 | HR 63

## 2023-01-27 DIAGNOSIS — L304 Erythema intertrigo: Secondary | ICD-10-CM | POA: Diagnosis not present

## 2023-01-27 DIAGNOSIS — D229 Melanocytic nevi, unspecified: Secondary | ICD-10-CM

## 2023-01-27 DIAGNOSIS — L578 Other skin changes due to chronic exposure to nonionizing radiation: Secondary | ICD-10-CM

## 2023-01-27 DIAGNOSIS — Z79899 Other long term (current) drug therapy: Secondary | ICD-10-CM

## 2023-01-27 DIAGNOSIS — L814 Other melanin hyperpigmentation: Secondary | ICD-10-CM

## 2023-01-27 DIAGNOSIS — Z1283 Encounter for screening for malignant neoplasm of skin: Secondary | ICD-10-CM | POA: Diagnosis not present

## 2023-01-27 DIAGNOSIS — L719 Rosacea, unspecified: Secondary | ICD-10-CM | POA: Diagnosis not present

## 2023-01-27 DIAGNOSIS — Z7189 Other specified counseling: Secondary | ICD-10-CM

## 2023-01-27 DIAGNOSIS — L300 Nummular dermatitis: Secondary | ICD-10-CM | POA: Diagnosis not present

## 2023-01-27 DIAGNOSIS — L821 Other seborrheic keratosis: Secondary | ICD-10-CM

## 2023-01-27 DIAGNOSIS — D220 Melanocytic nevi of lip: Secondary | ICD-10-CM

## 2023-01-27 MED ORDER — EUCRISA 2 % EX OINT: TOPICAL_OINTMENT | CUTANEOUS | 3 refills | Status: DC

## 2023-01-27 NOTE — Progress Notes (Unsigned)
Follow-Up Visit   Subjective  Miranda Aguilar is a 62 y.o. female who presents for the following: Annual Exam (1 year tbse, hx of rosacea ). The patient presents for Total-Body Skin Exam (TBSE) for skin cancer screening and mole check.  The patient has spots, moles and lesions to be evaluated, some may be new or changing and the patient has concerns that these could be cancer.  The following portions of the chart were reviewed this encounter and updated as appropriate:  Tobacco  Allergies  Meds  Problems  Med Hx  Surg Hx  Fam Hx     Review of Systems: No other skin or systemic complaints except as noted in HPI or Assessment and Plan.  Objective  Well appearing patient in no apparent distress; mood and affect are within normal limits.  A full examination was performed including scalp, head, eyes, ears, nose, lips, neck, chest, axillae, abdomen, back, buttocks, bilateral upper extremities, bilateral lower extremities, hands, feet, fingers, toes, fingernails, and toenails. All findings within normal limits unless otherwise noted below.  abdomen Scaly erythematous papules and plaques +/- edema and vesiculation.    Assessment & Plan  Rosacea face Rosacea is a chronic progressive skin condition usually affecting the face of adults, causing redness and/or acne bumps. It is treatable but not curable. It sometimes affects the eyes (ocular rosacea) as well. It may respond to topical and/or systemic medication and can flare with stress, sun exposure, alcohol, exercise and some foods.  Daily application of broad spectrum spf 30+ sunscreen to face is recommended to reduce flares.  Patient defers treatment   Discussed the treatment option of BBL/laser.  Typically we recommend 1-3 treatment sessions about 5-8 weeks apart for best results.  The patient's condition may require "maintenance treatments" in the future.  The fee for BBL / laser treatments is $350 per treatment session for the  whole face.  A fee can be quoted for other parts of the body. Insurance typically does not pay for BBL/laser treatments and therefore the fee is an out-of-pocket cost.  Erythema intertrigo b/l  Inframammary Fold Intertrigo is a chronic recurrent rash that occurs in skin fold areas that may be associated with friction; heat; moisture; yeast; fungus; and bacteria.  It is exacerbated by increased movement / activity; sweating; and higher atmospheric temperature.  Continue  OTC Zeasorb AF powder to body folds daily after shower.  It is often found in the athlete's foot section in the pharmacy.  Avoid using powders that contain cornstarch.   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 Condition is symptomatic / bothersome to patient. Not to goal. Atopic dermatitis (eczema) is a chronic, relapsing, pruritic condition that can significantly affect quality of life. It is often associated with allergic rhinitis and/or asthma and can require treatment with topical medications, phototherapy, or in severe cases biologic injectable medication (Dupixent; Adbry) or Oral JAK inhibitors.  Start Eucrisa 2 % ointment apply topically to affected areas daily for nummular dermatitis 3 rfs   Crisaborole (EUCRISA) 2 % OINT - abdomen Apply topically to aa of body for nummular dermatitis  Lentigines - Scattered tan macules - Due to sun exposure - Benign-appearing, observe - Recommend daily broad spectrum sunscreen SPF 30+ to sun-exposed areas, reapply every 2 hours as needed. - Call for any changes  Neoplasm of uncertain behavior -scar Right nose  at another doctor's office-patient has history of area treated with Ln2 and several weeks  later when it did not resolve completely it was biopsied. Pt reports biopsy did not show cancer. Area now healed without further growth. Benign-appearing.  Observation.  Call clinic for new or changing lesions.   Recommend daily use of broad spectrum spf 30+ sunscreen to sun-exposed areas.   Seborrheic Keratoses - Stuck-on, waxy, tan-brown papules and/or plaques  - Benign-appearing - Discussed benign etiology and prognosis. - Observe - Call for any changes  Melanocytic Nevi Left upper lip - Tan-brown and/or pink-flesh-colored symmetric macules and papules - Benign appearing on exam today - Observation - Call clinic for new or changing moles - Recommend daily use of broad spectrum spf 30+ sunscreen to sun-exposed areas.   Acrochordons (Skin Tags) - Fleshy, skin-colored pedunculated papules - Benign appearing.  - Observe. - If desired, they can be removed with an in office procedure that is not covered by insurance. - Please call the clinic if you notice any new or changing lesions.  Hemangiomas - Red papules - Discussed benign nature - Observe - Call for any changes  Actinic Damage - Chronic condition, secondary to cumulative UV/sun exposure - diffuse scaly erythematous macules with underlying dyspigmentation - Recommend daily broad spectrum sunscreen SPF 30+ to sun-exposed areas, reapply every 2 hours as needed.  - Staying in the shade or wearing long sleeves, sun glasses (UVA+UVB protection) and wide brim hats (4-inch brim around the entire circumference of the hat) are also recommended for sun protection.  - Call for new or changing lesions.  Skin cancer screening performed today. Return if symptoms worsen or fail to improve.  IRuthell Rummage, CMA, am acting as scribe for Sarina Ser, MD. Documentation: I have reviewed the above documentation for accuracy and completeness, and I agree with the above.  Sarina Ser, MD

## 2023-01-27 NOTE — Patient Instructions (Signed)
Melanoma ABCDEs  Melanoma is the most dangerous type of skin cancer, and is the leading cause of death from skin disease.  You are more likely to develop melanoma if you: Have light-colored skin, light-colored eyes, or red or blond hair Spend a lot of time in the sun Tan regularly, either outdoors or in a tanning bed Have had blistering sunburns, especially during childhood Have a close family member who has had a melanoma Have atypical moles or large birthmarks  Early detection of melanoma is key since treatment is typically straightforward and cure rates are extremely high if we catch it early.   The first sign of melanoma is often a change in a mole or a new dark spot.  The ABCDE system is a way of remembering the signs of melanoma.  A for asymmetry:  The two halves do not match. B for border:  The edges of the growth are irregular. C for color:  A mixture of colors are present instead of an even brown color. D for diameter:  Melanomas are usually (but not always) greater than 26m - the size of a pencil eraser. E for evolution:  The spot keeps changing in size, shape, and color.  Please check your skin once per month between visits. You can use a small mirror in front and a large mirror behind you to keep an eye on the back side or your body.   If you see any new or changing lesions before your next follow-up, please call to schedule a visit.  Please continue daily skin protection including broad spectrum sunscreen SPF 30+ to sun-exposed areas, reapplying every 2 hours as needed when you're outdoors.   Staying in the shade or wearing long sleeves, sun glasses (UVA+UVB protection) and wide brim hats (4-inch brim around the entire circumference of the hat) are also recommended for sun protection.    Due to recent changes in healthcare laws, you may see results of your pathology and/or laboratory studies on MyChart before the doctors have had a chance to review them. We understand that in  some cases there may be results that are confusing or concerning to you. Please understand that not all results are received at the same time and often the doctors may need to interpret multiple results in order to provide you with the best plan of care or course of treatment. Therefore, we ask that you please give u[place]2 business days to thoroughly review all your results before contacting the office for clarification. Should we see a critical lab result, you will be contacted sooner.   If You Need Anything After Your Visit  If you have any questions or concerns for your doctor, please call our main line at [phone] press option 4 to reach your doctor's medical assistant. If no one answers, please leave a voicemail as directed and we will return your call as soon as possible. Messages left after 4 pm will be answered the following business day.   You may also send [place] message via [place] We typically respond to MyChart messages within 1-2 business days.  For prescription refills, please ask your pharmacy to contact our office. Our fax number is [phone]  If you have an urgent issue when the clinic is closed that cannot wait until the next business day, you can page your doctor at the number below.    Please note that while we do our best to be available for urgent issues outside of office hours, we  are not available 24/7.   If you have an urgent issue and are unable to reach [place], you may choose to seek medical care at your doctor's office, retail clinic, urgent care center, or emergency room.  If you have a medical emergency, please immediately call 911 or go to the emergency department.  Pager Numbers  - Dr. Nehemiah Massed: [phone]  - Dr. Laurence Ferrari: [phone]  - Dr. Nicole Kindred: [phone]  In the event of inclement weather, please call our main line at [phone] for an update on the status of any delays or closures.  Dermatology Medication Tips: Please keep the boxes that  topical medications come in in order to help keep track of the instructions about where and how to use these. Pharmacies typically print the medication instructions only on the boxes and not directly on the medication tubes.   If your medication is too expensive, please contact our office at [phone] option 4 or send [place] a message through [place].   We are unable to tell what your co-pay for medications will be in advance as this is different depending on your insurance coverage. However, we may be able to find a substitute medication at lower cost or fill out paperwork to get insurance to cover a needed medication.   If a prior authorization is required to get your medication covered by your insurance company, please allow [place] 1-2 business days to complete this process.  Drug prices often vary depending on where the prescription is filled and some pharmacies may offer cheaper prices.  The website www.goodrx.com contains coupons for medications through different pharmacies. The prices here do not account for what the cost may be with help from insurance (it may be cheaper with your insurance), but the website can give you the price if you did not use any insurance.  - You can print the associated coupon and take it with your prescription to the pharmacy.  - You may also stop by our office during regular business hours and pick up a GoodRx coupon card.  - If you need your prescription sent electronically to a different pharmacy, notify our office through [facility] or by phone at [phone] option 4.     Si Usted Necesita Algo Despus de Su Visita  Tambin puede enviarnos un mensaje a travs de Pharmacist, community. Por lo general respondemos a los mensajes de MyChart en el transcurso de 1 a 2 das hbiles.  Para renovar recetas, por favor pida a su farmacia que se ponga en contacto con nuestra oficina. Harland Dingwall de fax es [place] [phone].  Si tiene un asunto urgente cuando la clnica  est cerrada y que no puede esperar hasta el siguiente da hbil, puede llamar/localizar a su doctor(a) al nmero que aparece a continuacin.   Por favor, tenga en cuenta que aunque hacemos todo lo posible para estar disponibles para asuntos urgentes fuera del horario de [place], no estamos disponibles las 24 horas del da, los 7 das de la [place].   Si tiene un problema urgente y no puede comunicarse con nosotros, puede optar por buscar atencin mdica  en el consultorio de su doctor(a), en una clnica privada, en un centro de atencin urgente o en una sala de emergencias.  Si tiene Engineering geologist, por favor llame inmediatamente al 911 o vaya a la sala de emergencias.  Nmeros de bper  - Dr. Nehemiah Massed: [phone]  - Dra. Moye: [phone]  - Dra. Nicole Kindred: [phone]  En caso de inclemencias del [place], por  favor llame a nuestra lnea principal al [phone] para una actualizacin sobre el [place] de cualquier retraso o cierre.  Consejos para la medicacin en dermatologa: Por favor, guarde las cajas en las que vienen los medicamentos de uso tpico para ayudarle a seguir las instrucciones sobre dnde y cmo usarlos. Las farmacias generalmente imprimen las instrucciones del medicamento slo en las cajas y no directamente en los tubos del [place].   Si su medicamento es muy caro, por favor, pngase en contacto con Zigmund Daniel llamando al [phone] y presione la opcin 4 o envenos un mensaje a travs de Pharmacist, community.   No podemos decirle cul ser su copago por los medicamentos por adelantado ya que esto es diferente dependiendo de la cobertura de su seguro. Sin embargo, es posible que podamos encontrar un medicamento sustituto a Electrical engineer un formulario para que el seguro cubra el medicamento que se considera necesario.   Si se requiere una autorizacin previa para que su compaa de seguros [place] su medicamento, por favor permtanos de 1 a 2 das hbiles para  completar este proceso.  Los precios de los medicamentos varan con frecuencia dependiendo del Environmental consultant de dnde se surte la receta y alguna farmacias pueden ofrecer precios ms baratos.  El sitio web www.goodrx.com tiene cupones para medicamentos de Airline pilot. Los precios aqu no tienen en cuenta lo que podra costar con la ayuda del seguro (puede ser ms barato con su seguro), pero el sitio web puede darle el precio si no utiliz Research scientist (physical sciences).  - Puede imprimir el cupn correspondiente y llevarlo con su receta a la farmacia.  - Tambin puede pasar por nuestra oficina durante el horario de atencin regular y Charity fundraiser una tarjeta de cupones de GoodRx.  - Si necesita que su receta se enve electrnicamente a una farmacia diferente, informe a nuestra oficina a travs de MyChart de [place] o por telfono llamando al [phone] y presione la opcin 4.

## 2023-01-28 ENCOUNTER — Encounter: Payer: Self-pay | Admitting: Dermatology

## 2023-02-07 DIAGNOSIS — R7309 Other abnormal glucose: Secondary | ICD-10-CM | POA: Diagnosis not present

## 2023-03-10 DIAGNOSIS — R7309 Other abnormal glucose: Secondary | ICD-10-CM | POA: Diagnosis not present

## 2023-03-14 ENCOUNTER — Ambulatory Visit
Admission: RE | Admit: 2023-03-14 | Discharge: 2023-03-14 | Disposition: A | Discharge status: Home / Self Care | Payer: BC Managed Care – PPO | Source: Ambulatory Visit | Attending: Family | Admitting: Family

## 2023-03-14 ENCOUNTER — Encounter: Payer: Self-pay | Admitting: Family

## 2023-03-14 DIAGNOSIS — Z1231 Encounter for screening mammogram for malignant neoplasm of breast: Secondary | ICD-10-CM | POA: Diagnosis not present

## 2023-04-09 DIAGNOSIS — R7309 Other abnormal glucose: Secondary | ICD-10-CM | POA: Diagnosis not present

## 2023-04-28 ENCOUNTER — Ambulatory Visit: Payer: BC Managed Care – PPO | Admitting: Family

## 2023-05-10 DIAGNOSIS — R7309 Other abnormal glucose: Secondary | ICD-10-CM | POA: Diagnosis not present

## 2023-06-06 ENCOUNTER — Ambulatory Visit: Payer: BC Managed Care – PPO | Admitting: Family

## 2023-06-09 DIAGNOSIS — R7309 Other abnormal glucose: Secondary | ICD-10-CM | POA: Diagnosis not present

## 2023-07-10 DIAGNOSIS — R7309 Other abnormal glucose: Secondary | ICD-10-CM | POA: Diagnosis not present

## 2023-07-24 ENCOUNTER — Encounter (INDEPENDENT_AMBULATORY_CARE_PROVIDER_SITE_OTHER): Payer: Self-pay

## 2023-08-10 DIAGNOSIS — R7309 Other abnormal glucose: Secondary | ICD-10-CM | POA: Diagnosis not present

## 2023-08-15 ENCOUNTER — Ambulatory Visit: Payer: BC Managed Care – PPO | Admitting: Family

## 2023-08-15 ENCOUNTER — Encounter: Payer: Self-pay | Admitting: Family

## 2023-08-15 ENCOUNTER — Other Ambulatory Visit (HOSPITAL_COMMUNITY)
Admission: RE | Admit: 2023-08-15 | Discharge: 2023-08-15 | Disposition: A | Discharge status: Home / Self Care | Payer: BC Managed Care – PPO | Source: Ambulatory Visit | Attending: Family | Admitting: Family

## 2023-08-15 VITALS — BP 130/80 | HR 72 | Temp 98.0°F | Ht 64.0 in | Wt 268.8 lb

## 2023-08-15 DIAGNOSIS — I10 Essential (primary) hypertension: Secondary | ICD-10-CM | POA: Diagnosis not present

## 2023-08-15 DIAGNOSIS — Z0000 Encounter for general adult medical examination without abnormal findings: Secondary | ICD-10-CM

## 2023-08-15 DIAGNOSIS — E559 Vitamin D deficiency, unspecified: Secondary | ICD-10-CM

## 2023-08-15 DIAGNOSIS — Z124 Encounter for screening for malignant neoplasm of cervix: Secondary | ICD-10-CM | POA: Diagnosis not present

## 2023-08-15 LAB — COMPREHENSIVE METABOLIC PANEL
ALT: 23 U/L (ref 0–35)
AST: 22 U/L (ref 0–37)
Albumin: 4.2 g/dL (ref 3.5–5.2)
Alkaline Phosphatase: 54 U/L (ref 39–117)
BUN: 16 mg/dL (ref 6–23)
CO2: 27 meq/L (ref 19–32)
Calcium: 9.7 mg/dL (ref 8.4–10.5)
Chloride: 105 meq/L (ref 96–112)
Creatinine, Ser: 0.67 mg/dL (ref 0.40–1.20)
GFR: 93.75 mL/min (ref >60.00)
Glucose, Bld: 103 mg/dL — ABNORMAL HIGH (ref 70–99)
Potassium: 4.2 meq/L (ref 3.5–5.1)
Sodium: 140 meq/L (ref 135–145)
Total Bilirubin: 0.7 mg/dL (ref 0.2–1.2)
Total Protein: 7.5 g/dL (ref 6.0–8.3)

## 2023-08-15 LAB — HEMOGLOBIN A1C: Hgb A1c MFr Bld: 5.9 % (ref 4.6–6.5)

## 2023-08-15 LAB — VITAMIN D 25 HYDROXY (VIT D DEFICIENCY, FRACTURES): VITD: 46.68 ng/mL (ref 30.00–100.00)

## 2023-08-15 LAB — LIPID PANEL
Cholesterol: 193 mg/dL (ref 0–200)
HDL: 65 mg/dL (ref >39.00)
LDL Cholesterol: 114 mg/dL — ABNORMAL HIGH (ref 0–99)
NonHDL: 128.23
Total CHOL/HDL Ratio: 3
Triglycerides: 69 mg/dL (ref 0.0–149.0)
VLDL: 13.8 mg/dL (ref 0.0–40.0)

## 2023-08-15 NOTE — Progress Notes (Unsigned)
Assessment & Plan:  Screening for cervical cancer -     Cytology - PAP  Essential hypertension -     VITAMIN D 25 Hydroxy (Vit-D Deficiency, Fractures) -     Hemoglobin A1c -     Comprehensive metabolic panel -     Lipid panel  Vitamin D deficiency  Routine physical examination Assessment & Plan: Clinical breast exam performed today, Pap smear obtained.  Encouraged continued walking.        Return precautions given.   Risks, benefits, and alternatives of the medications and treatment plan prescribed today were discussed, and patient expressed understanding.   Education regarding symptom management and diagnosis given to patient on AVS either electronically or printed.  Return in about 1 year (around 08/14/2024) for Complete Physical Exam.  Rennie Plowman, FNP  Subjective:    Patient ID: Miranda Aguilar, female    DOB: Sep 19, 1961, 62 y.o.   MRN: [id number]  CC: Miranda Aguilar is a 62 y.o. female who presents today for physical exam.    HPI: Overall feels well today.  She very rarely uses hydroxyzine 10 mg   History of basal cell carcinoma.  Continues to follow with dermatology Colorectal Cancer Screening: UTD , 05/22/2015 Breast Cancer Screening: Mammogram UTD Cervical Cancer Screening: UTD, 05/02/2021 atrophy present, negative malignancy, negative HPV  Bone Health screening/DEXA for 65+: No increased fracture risk. Defer screening at this time        Tetanus - UTD         Exercise: Gets regular exercise, walking.   Alcohol use:  glass of wine ,5-6 nights per week Smoking/tobacco use: Nonsmoker.    Health Maintenance  Topic Date Due   Pap Smear  02/09/2023   COVID-19 Vaccine (6 - 2023-24 season) 08/10/2023   Flu Shot  03/08/2024*   Mammogram  03/13/2025   DTaP/Tdap/Td vaccine (2 - Td or Tdap) 03/19/2025   Colon Cancer Screening  05/21/2025   Hepatitis C Screening  Completed   HIV Screening  Completed   Zoster (Shingles) Vaccine  Completed   HPV  Vaccine  Aged Out  *Topic was postponed. The date shown is not the original due date.    ALLERGIES: Patient has no known allergies.  Current Outpatient Medications on File Prior to Visit  Medication Sig Dispense Refill   Cholecalciferol (VITAMIN D) 2000 units CAPS Take 2,000 Units by mouth daily.     hydrOXYzine (ATARAX) 10 MG tablet Take 1 tablet (10 mg total) by mouth 2 (two) times daily as needed for anxiety. 90 tablet 1   ibuprofen (ADVIL,MOTRIN) 200 MG tablet Take 400 mg by mouth every 8 (eight) hours as needed for mild pain or moderate pain.      No current facility-administered medications on file prior to visit.    Review of Systems  Constitutional:  Negative for chills and fever.  Respiratory:  Negative for cough.   Cardiovascular:  Negative for chest pain and palpitations.  Gastrointestinal:  Negative for nausea and vomiting.      Objective:    BP 130/80   Pulse 72   Temp 98 F (36.7 C) (Oral)   Ht 5\' 4"  (1.626 m)   Wt 268 lb 12.8 oz (121.9 kg)   LMP 12/16/2017 (Approximate)   SpO2 97%   BMI 46.14 kg/m   BP Readings from Last 3 Encounters:  08/15/23 130/80  01/27/23 129/79  12/20/22 134/86   Wt Readings from Last 3 Encounters:  08/15/23 268 lb 12.8 oz (  121.9 kg)  12/20/22 250 lb 9.6 oz (113.7 kg)  08/07/22 275 lb 6.4 oz (124.9 kg)      08/15/2023   10:29 AM 12/20/2022    3:03 PM 08/07/2022   10:45 AM  Depression screen PHQ 2/9  Decreased Interest 0 0 0  Down, Depressed, Hopeless 0 0 0  PHQ - 2 Score 0 0 0  Altered sleeping 0 0   Tired, decreased energy 0 0   Change in appetite 0 0   Feeling bad or failure about yourself  0 0   Trouble concentrating 0 0   Moving slowly or fidgety/restless 0 0   Suicidal thoughts 0 0   PHQ-9 Score 0 0   Difficult doing work/chores Not difficult at all Not difficult at all      Physical Exam Vitals reviewed.  Constitutional:      Appearance: Normal appearance. She is well-developed.  Eyes:      Conjunctiva/sclera: Conjunctivae normal.  Neck:     Thyroid: No thyroid mass or thyromegaly.  Cardiovascular:     Rate and Rhythm: Normal rate and regular rhythm.     Pulses: Normal pulses.     Heart sounds: Normal heart sounds.  Pulmonary:     Effort: Pulmonary effort is normal.     Breath sounds: Normal breath sounds. No wheezing, rhonchi or rales.  Chest:  Breasts:    Breasts are symmetrical.     Right: No inverted nipple, mass, nipple discharge, skin change or tenderness.     Left: No inverted nipple, mass, nipple discharge, skin change or tenderness.  Abdominal:     General: Bowel sounds are normal. There is no distension.     Palpations: Abdomen is soft. Abdomen is not rigid. There is no fluid wave or mass.     Tenderness: There is no abdominal tenderness. There is no guarding or rebound.  Genitourinary:    Cervix: No cervical motion tenderness, discharge or friability.     Uterus: Not enlarged, not fixed and not tender.      Adnexa:        Right: No mass, tenderness or fullness.         Left: No mass, tenderness or fullness.       Comments: Pap performed. No CMT. Unable to appreciated ovaries. Lymphadenopathy:     Head:     Right side of head: No submental, submandibular, tonsillar, preauricular, posterior auricular or occipital adenopathy.     Left side of head: No submental, submandibular, tonsillar, preauricular, posterior auricular or occipital adenopathy.     Cervical: No cervical adenopathy.     Right cervical: No superficial, deep or posterior cervical adenopathy.    Left cervical: No superficial, deep or posterior cervical adenopathy.     Upper Body:     Right upper body: No pectoral adenopathy.     Left upper body: No pectoral adenopathy.  Skin:    General: Skin is warm and dry.  Neurological:     Mental Status: She is alert.  Psychiatric:        Speech: Speech normal.        Behavior: Behavior normal.        Thought Content: Thought content normal.

## 2023-08-15 NOTE — Patient Instructions (Signed)
Health Maintenance for Postmenopausal Women Menopause is a normal process in which your ability to get pregnant comes to an end. This process happens slowly over many months or years, usually between the ages of 48 and 55. Menopause is complete when you have missed your menstrual period for 12 months. It is important to talk with your health care provider about some of the most common conditions that affect women after menopause (postmenopausal women). These include heart disease, cancer, and bone loss (osteoporosis). Adopting a healthy lifestyle and getting preventive care can help to promote your health and wellness. The actions you take can also lower your chances of developing some of these common conditions. What are the signs and symptoms of menopause? During menopause, you may have the following symptoms: Hot flashes. These can be moderate or severe. Night sweats. Decrease in sex drive. Mood swings. Headaches. Tiredness (fatigue). Irritability. Memory problems. Problems falling asleep or staying asleep. Talk with your health care provider about treatment options for your symptoms. Do I need hormone replacement therapy? Hormone replacement therapy is effective in treating symptoms that are caused by menopause, such as hot flashes and night sweats. Hormone replacement carries certain risks, especially as you become older. If you are thinking about using estrogen or estrogen with progestin, discuss the benefits and risks with your health care provider. How can I reduce my risk for heart disease and stroke? The risk of heart disease, heart attack, and stroke increases as you age. One of the causes may be a change in the body's hormones during menopause. This can affect how your body uses dietary fats, triglycerides, and cholesterol. Heart attack and stroke are medical emergencies. There are many things that you can do to help prevent heart disease and stroke. Watch your blood pressure High  blood pressure causes heart disease and increases the risk of stroke. This is more likely to develop in people who have high blood pressure readings or are overweight. Have your blood pressure checked: Every 3-5 years if you are 18-39 years of age. Every year if you are 40 years old or older. Eat a healthy diet  Eat a diet that includes plenty of vegetables, fruits, low-fat dairy products, and lean protein. Do not eat a lot of foods that are high in solid fats, added sugars, or sodium. Get regular exercise Get regular exercise. This is one of the most important things you can do for your health. Most adults should: Try to exercise for at least 150 minutes each week. The exercise should increase your heart rate and make you sweat (moderate-intensity exercise). Try to do strengthening exercises at least twice each week. Do these in addition to the moderate-intensity exercise. Spend less time sitting. Even light physical activity can be beneficial. Other tips Work with your health care provider to achieve or maintain a healthy weight. Do not use any products that contain nicotine or tobacco. These products include cigarettes, chewing tobacco, and vaping devices, such as e-cigarettes. If you need help quitting, ask your health care provider. Know your numbers. Ask your health care provider to check your cholesterol and your blood sugar (glucose). Continue to have your blood tested as directed by your health care provider. Do I need screening for cancer? Depending on your health history and family history, you may need to have cancer screenings at different stages of your life. This may include screening for: Breast cancer. Cervical cancer. Lung cancer. Colorectal cancer. What is my risk for osteoporosis? After menopause, you may be   at increased risk for osteoporosis. Osteoporosis is a condition in which bone destruction happens more quickly than new bone creation. To help prevent osteoporosis or  the bone fractures that can happen because of osteoporosis, you may take the following actions: If you are 19-50 years old, get at least 1,000 mg of calcium and at least 600 international units (IU) of vitamin D per day. If you are older than age 50 but younger than age 70, get at least 1,200 mg of calcium and at least 600 international units (IU) of vitamin D per day. If you are older than age 70, get at least 1,200 mg of calcium and at least 800 international units (IU) of vitamin D per day. Smoking and drinking excessive alcohol increase the risk of osteoporosis. Eat foods that are rich in calcium and vitamin D, and do weight-bearing exercises several times each week as directed by your health care provider. How does menopause affect my mental health? Depression may occur at any age, but it is more common as you become older. Common symptoms of depression include: Feeling depressed. Changes in sleep patterns. Changes in appetite or eating patterns. Feeling an overall lack of motivation or enjoyment of activities that you previously enjoyed. Frequent crying spells. Talk with your health care provider if you think that you are experiencing any of these symptoms. General instructions See your health care provider for regular wellness exams and vaccines. This may include: Scheduling regular health, dental, and eye exams. Getting and maintaining your vaccines. These include: Influenza vaccine. Get this vaccine each year before the flu season begins. Pneumonia vaccine. Shingles vaccine. Tetanus, diphtheria, and pertussis (Tdap) booster vaccine. Your health care provider may also recommend other immunizations. Tell your health care provider if you have ever been abused or do not feel safe at home. Summary Menopause is a normal process in which your ability to get pregnant comes to an end. This condition causes hot flashes, night sweats, decreased interest in sex, mood swings, headaches, or lack  of sleep. Treatment for this condition may include hormone replacement therapy. Take actions to keep yourself healthy, including exercising regularly, eating a healthy diet, watching your weight, and checking your blood pressure and blood sugar levels. Get screened for cancer and depression. Make sure that you are up to date with all your vaccines. This information is not intended to replace advice given to you by your health care provider. Make sure you discuss any questions you have with your health care provider. Document Revised: 04/16/2021 Document Reviewed: 04/16/2021 Elsevier Patient Education  2024 [employer]  

## 2023-08-18 NOTE — Assessment & Plan Note (Signed)
Clinical breast exam performed today, Pap smear obtained.  Encouraged continued walking.

## 2023-08-21 LAB — CYTOLOGY - PAP
Comment: NEGATIVE
Diagnosis: NEGATIVE
Diagnosis: REACTIVE
High risk HPV: NEGATIVE

## 2023-09-04 ENCOUNTER — Encounter: Payer: Self-pay | Admitting: *Deleted

## 2023-09-06 ENCOUNTER — Encounter: Payer: Self-pay | Admitting: Family

## 2023-09-09 DIAGNOSIS — R7309 Other abnormal glucose: Secondary | ICD-10-CM | POA: Diagnosis not present

## 2023-10-10 DIAGNOSIS — R7309 Other abnormal glucose: Secondary | ICD-10-CM | POA: Diagnosis not present

## 2023-11-09 DIAGNOSIS — R7309 Other abnormal glucose: Secondary | ICD-10-CM | POA: Diagnosis not present

## 2023-11-19 ENCOUNTER — Emergency Department (HOSPITAL_COMMUNITY): Payer: BC Managed Care – PPO

## 2023-11-19 ENCOUNTER — Emergency Department (HOSPITAL_COMMUNITY)
Admission: EM | Admit: 2023-11-19 | Discharge: 2023-11-19 | Disposition: A | Discharge status: Home / Self Care | Payer: BC Managed Care – PPO | Source: Home / Self Care | Attending: Emergency Medicine | Admitting: Emergency Medicine

## 2023-11-19 ENCOUNTER — Other Ambulatory Visit: Payer: Self-pay

## 2023-11-19 DIAGNOSIS — Z823 Family history of stroke: Secondary | ICD-10-CM | POA: Diagnosis not present

## 2023-11-19 DIAGNOSIS — Z8249 Family history of ischemic heart disease and other diseases of the circulatory system: Secondary | ICD-10-CM | POA: Diagnosis not present

## 2023-11-19 DIAGNOSIS — I517 Cardiomegaly: Secondary | ICD-10-CM | POA: Diagnosis not present

## 2023-11-19 DIAGNOSIS — I10 Essential (primary) hypertension: Secondary | ICD-10-CM | POA: Diagnosis not present

## 2023-11-19 DIAGNOSIS — Z85828 Personal history of other malignant neoplasm of skin: Secondary | ICD-10-CM | POA: Diagnosis not present

## 2023-11-19 DIAGNOSIS — E869 Volume depletion, unspecified: Secondary | ICD-10-CM | POA: Diagnosis not present

## 2023-11-19 DIAGNOSIS — Q631 Lobulated, fused and horseshoe kidney: Secondary | ICD-10-CM | POA: Diagnosis not present

## 2023-11-19 DIAGNOSIS — Z803 Family history of malignant neoplasm of breast: Secondary | ICD-10-CM | POA: Diagnosis not present

## 2023-11-19 DIAGNOSIS — R1011 Right upper quadrant pain: Secondary | ICD-10-CM | POA: Diagnosis not present

## 2023-11-19 DIAGNOSIS — K56609 Unspecified intestinal obstruction, unspecified as to partial versus complete obstruction: Secondary | ICD-10-CM

## 2023-11-19 DIAGNOSIS — K429 Umbilical hernia without obstruction or gangrene: Secondary | ICD-10-CM | POA: Diagnosis not present

## 2023-11-19 DIAGNOSIS — E876 Hypokalemia: Secondary | ICD-10-CM | POA: Diagnosis not present

## 2023-11-19 DIAGNOSIS — K430 Incisional hernia with obstruction, without gangrene: Secondary | ICD-10-CM | POA: Diagnosis not present

## 2023-11-19 DIAGNOSIS — K838 Other specified diseases of biliary tract: Secondary | ICD-10-CM | POA: Diagnosis not present

## 2023-11-19 DIAGNOSIS — K7689 Other specified diseases of liver: Secondary | ICD-10-CM | POA: Diagnosis not present

## 2023-11-19 DIAGNOSIS — K219 Gastro-esophageal reflux disease without esophagitis: Secondary | ICD-10-CM | POA: Diagnosis not present

## 2023-11-19 DIAGNOSIS — R0989 Other specified symptoms and signs involving the circulatory and respiratory systems: Secondary | ICD-10-CM | POA: Diagnosis not present

## 2023-11-19 DIAGNOSIS — R0902 Hypoxemia: Secondary | ICD-10-CM | POA: Diagnosis not present

## 2023-11-19 DIAGNOSIS — Z6841 Body Mass Index (BMI) 40.0 and over, adult: Secondary | ICD-10-CM | POA: Diagnosis not present

## 2023-11-19 DIAGNOSIS — Z8049 Family history of malignant neoplasm of other genital organs: Secondary | ICD-10-CM | POA: Diagnosis not present

## 2023-11-19 DIAGNOSIS — Z83438 Family history of other disorder of lipoprotein metabolism and other lipidemia: Secondary | ICD-10-CM | POA: Diagnosis not present

## 2023-11-19 DIAGNOSIS — Z833 Family history of diabetes mellitus: Secondary | ICD-10-CM | POA: Diagnosis not present

## 2023-11-19 DIAGNOSIS — K420 Umbilical hernia with obstruction, without gangrene: Secondary | ICD-10-CM | POA: Insufficient documentation

## 2023-11-19 DIAGNOSIS — K5651 Intestinal adhesions [bands], with partial obstruction: Secondary | ICD-10-CM | POA: Diagnosis not present

## 2023-11-19 DIAGNOSIS — R112 Nausea with vomiting, unspecified: Secondary | ICD-10-CM | POA: Diagnosis not present

## 2023-11-19 DIAGNOSIS — E66813 Obesity, class 3: Secondary | ICD-10-CM | POA: Diagnosis not present

## 2023-11-19 DIAGNOSIS — Z4682 Encounter for fitting and adjustment of non-vascular catheter: Secondary | ICD-10-CM | POA: Diagnosis not present

## 2023-11-19 DIAGNOSIS — K436 Other and unspecified ventral hernia with obstruction, without gangrene: Secondary | ICD-10-CM | POA: Diagnosis not present

## 2023-11-19 LAB — CBC WITH DIFFERENTIAL/PLATELET
Abs Immature Granulocytes: 0.02 10*3/uL (ref 0.00–0.07)
Basophils Absolute: 0 10*3/uL (ref 0.0–0.1)
Basophils Relative: 0 %
Eosinophils Absolute: 0 10*3/uL (ref 0.0–0.5)
Eosinophils Relative: 1 %
HCT: 44.1 % (ref 36.0–46.0)
Hemoglobin: 15.5 g/dL — ABNORMAL HIGH (ref 12.0–15.0)
Immature Granulocytes: 0 %
Lymphocytes Relative: 9 %
Lymphs Abs: 0.7 10*3/uL (ref 0.7–4.0)
MCH: 32 pg (ref 26.0–34.0)
MCHC: 35.1 g/dL (ref 30.0–36.0)
MCV: 91.1 fL (ref 80.0–100.0)
Monocytes Absolute: 0.6 10*3/uL (ref 0.1–1.0)
Monocytes Relative: 7 %
Neutro Abs: 6.5 10*3/uL (ref 1.7–7.7)
Neutrophils Relative %: 83 %
Platelets: 214 10*3/uL (ref 150–400)
RBC: 4.84 MIL/uL (ref 3.87–5.11)
RDW: 12.8 % (ref 11.5–15.5)
WBC: 7.8 10*3/uL (ref 4.0–10.5)
nRBC: 0 % (ref 0.0–0.2)

## 2023-11-19 LAB — COMPREHENSIVE METABOLIC PANEL
ALT: 21 U/L (ref 0–44)
AST: 24 U/L (ref 15–41)
Albumin: 4.5 g/dL (ref 3.5–5.0)
Alkaline Phosphatase: 62 U/L (ref 38–126)
Anion gap: 9 (ref 5–15)
BUN: 18 mg/dL (ref 8–23)
CO2: 22 mmol/L (ref 22–32)
Calcium: 9.5 mg/dL (ref 8.9–10.3)
Chloride: 105 mmol/L (ref 98–111)
Creatinine, Ser: 0.71 mg/dL (ref 0.44–1.00)
GFR, Estimated: >60 mL/min (ref >60)
Glucose, Bld: 163 mg/dL — ABNORMAL HIGH (ref 70–99)
Potassium: 3.6 mmol/L (ref 3.5–5.1)
Sodium: 136 mmol/L (ref 135–145)
Total Bilirubin: 1 mg/dL (ref <1.2)
Total Protein: 8.1 g/dL (ref 6.5–8.1)

## 2023-11-19 LAB — LIPASE, BLOOD: Lipase: 21 U/L (ref 11–51)

## 2023-11-19 MED ORDER — ONDANSETRON HCL 4 MG/2ML IJ SOLN
4.0000 mg | Freq: Once | INTRAMUSCULAR | Status: AC
  Administered 2023-11-19: 4 mg via INTRAVENOUS
  Filled 2023-11-19: qty 2

## 2023-11-19 MED ORDER — HYDROCODONE-ACETAMINOPHEN 5-325 MG PO TABS: 1.0000 | ORAL_TABLET | Freq: Four times a day (QID) | ORAL | 0 refills | Status: DC | PRN

## 2023-11-19 MED ORDER — ONDANSETRON 4 MG PO TBDP: 4.0000 mg | ORAL_TABLET | Freq: Three times a day (TID) | ORAL | 1 refills | Status: DC | PRN

## 2023-11-19 MED ORDER — IOHEXOL 300 MG/ML  SOLN
100.0000 mL | Freq: Once | INTRAMUSCULAR | Status: AC | PRN
  Administered 2023-11-19: 100 mL via INTRAVENOUS

## 2023-11-19 MED ORDER — HYDROMORPHONE HCL 1 MG/ML IJ SOLN
1.0000 mg | Freq: Once | INTRAMUSCULAR | Status: AC
  Administered 2023-11-19: 1 mg via INTRAVENOUS
  Filled 2023-11-19: qty 1

## 2023-11-19 NOTE — Discharge Instructions (Addendum)
Give Dr. Lovell Sheehan a call.  He will see you in the office next week.  Recommend clear liquid diet for the next 2 days and bland diet.  Take the hydrocodone as needed for pain.  Prepack provided here today.  And take the Zofran as needed for nausea vomiting.  Return for any new or worse symptoms.  Strictly if there is persistent vomiting or fevers.

## 2023-11-19 NOTE — ED Notes (Signed)
Patient transported to CT 

## 2023-11-19 NOTE — ED Triage Notes (Signed)
Pt c/o abd pain that started Monday, intermittent, describes it as "gallbladder attacks and endorses Hx of "gallbladder attacks". States some N/V. Stated URQ pain and R side pain rates pain 4/10

## 2023-11-19 NOTE — ED Provider Notes (Addendum)
[place] EMERGENCY DEPARTMENT AT [facility] Provider Note   CSN: [id number] Arrival date & time: 11/19/23  0865     History  Chief Complaint  Patient presents with   Abdominal Pain    Miranda Aguilar is a 62 y.o. female.  Patient with a complaint of epigastric right upper quadrant and right sided abdominal pain intermittently since Monday.  But in the last 12 hours has become more constant.  Some nausea vomiting.  Vomit is bile in nature.  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  Gallbladder was not removed at that time because there was no gallstones in the gallbladder.  Patient's had some attacks like this intermittently like once a year.  But usually resolves within hours.  Patient does not know whether she has gallstones currently.  Past medical history sniffing for gallstone ileus hypertension obesity gastroesophageal reflux disease.  Patient's laparotomy for the gallstone ileus was in 2016 was done by Dr. Lovell Sheehan and it was done here at [facility].       Home Medications Prior to Admission medications   Medication Sig Start Date End Date Taking? Authorizing Provider  acetaminophen (TYLENOL) 500 MG tablet Take 1,000 mg by mouth every 6 (six) hours as needed for moderate pain (pain score 4-6).   Yes [provider]  Cholecalciferol (VITAMIN D) 2000 units CAPS Take 2,000 Units by mouth daily.   Yes [provider]  hydrOXYzine (ATARAX) 10 MG tablet Take 1 tablet (10 mg total) by mouth 2 (two) times daily as needed for anxiety. 01/01/22  Yes Arnett, Lyn Records, FNP  ibuprofen (ADVIL,MOTRIN) 200 MG tablet Take 400 mg by mouth every 8 (eight) hours as needed for mild pain or moderate pain.    Yes [provider]      Allergies    Patient has no known allergies.    Review of Systems   Review of Systems  Constitutional:  Negative for chills and fever.  HENT:  Negative for rhinorrhea and sore throat.    Eyes:  Negative for visual disturbance.  Respiratory:  Negative for cough and shortness of breath.   Cardiovascular:  Negative for chest pain and leg swelling.  Gastrointestinal:  Positive for abdominal pain, nausea and vomiting. Negative for diarrhea.  Genitourinary:  Negative for dysuria.  Musculoskeletal:  Negative for back pain and neck pain.  Skin:  Negative for rash.  Neurological:  Negative for dizziness, light-headedness and headaches.  Hematological:  Does not bruise/bleed easily.  Psychiatric/Behavioral:  Negative for confusion.     Physical Exam Updated Vital Signs BP (!) 157/106   Pulse 70   Temp 98.3 F (36.8 C) (Miranda)   LMP 12/16/2017 (Approximate)   SpO2 93%  Physical Exam Vitals and nursing note reviewed.  Constitutional:      General: She is not in acute distress.    Appearance: Normal appearance. She is well-developed.  HENT:     Head: Normocephalic and atraumatic.  Eyes:     Conjunctiva/sclera: Conjunctivae normal.  Cardiovascular:     Rate and Rhythm: Normal rate and regular rhythm.     Heart sounds: No murmur heard. Pulmonary:     Effort: Pulmonary effort is normal. No respiratory distress.     Breath sounds: Normal breath sounds.  Abdominal:     Palpations: Abdomen is soft.     Tenderness: There is abdominal tenderness. There is no guarding.     Hernia: A hernia is present.  Comments: Tenderness to palpation right upper quadrant.  No guarding.  Palpable ventral hernia above the umbilicus.  Nontender.  No erythema.  The 1 area seems larger and there is seems small the large area is easily reducible it is difficult to say whether the small area is reducible or not.  Musculoskeletal:        General: No swelling.     Cervical back: Neck supple.  Skin:    General: Skin is warm and dry.     Capillary Refill: Capillary refill takes less than 2 seconds.  Neurological:     General: No focal deficit present.     Mental Status: She is alert and oriented  to person, place, and time.  Psychiatric:        Mood and Affect: Mood normal.     ED Results / Procedures / Treatments   Labs (all labs ordered are listed, but only abnormal results are displayed) Labs Reviewed  CBC WITH DIFFERENTIAL/PLATELET - Abnormal; Notable for the following components:      Result Value   Hemoglobin 15.5 (*)    All other components within normal limits  COMPREHENSIVE METABOLIC PANEL - Abnormal; Notable for the following components:   Glucose, Bld 163 (*)    All other components within normal limits  LIPASE, BLOOD    EKG None  Radiology CT ABDOMEN PELVIS W CONTRAST  Result Date: 11/19/2023 CLINICAL DATA:  Intermittent right-sided abdominal pain since Monday. EXAM: CT ABDOMEN AND PELVIS WITH CONTRAST TECHNIQUE: Multidetector CT imaging of the abdomen and pelvis was performed using the standard protocol following bolus administration of intravenous contrast. RADIATION DOSE REDUCTION: This exam was performed according to the departmental dose-optimization program which includes automated exposure control, adjustment of the mA and/or kV according to patient size and/or use of iterative reconstruction technique. CONTRAST:  OMNIPAQUE IOHEXOL 300 MG/ML  SOLN COMPARISON:  Right upper quadrant ultrasound from same day. CT abdomen pelvis dated January 06, 2019. FINDINGS: Lower chest: No acute abnormality. Hepatobiliary: No focal liver abnormality. Chronically contracted gallbladder, unchanged in appearance since 2020. No radiopaque gallstones or wall thickening. New mild central intrahepatic and proximal common bile duct biliary dilatation with pneumobilia. Pancreas: Unremarkable. No pancreatic ductal dilatation or surrounding inflammatory changes. Spleen: Normal in size without focal abnormality. Adrenals/Urinary Tract: Adrenal glands are unremarkable. Horseshoe kidney again noted. No renal mass, calculi, or hydronephrosis. Small right posterolateral bladder  diverticulum again noted. The bladder is otherwise unremarkable. Stomach/Bowel: Distended stomach. Multiple dilated loops of small bowel, with multiple transition points involving a complex umbilical hernia. There is a small amount of ascites surrounding the herniated small bowel loops without wall thickening or pneumatosis. The distal small bowel is decompressed. Majority of the colon is decompressed. Diminutive or absent appendix. Left-sided colonic diverticulosis. Vascular/Lymphatic: Aortic atherosclerosis. No enlarged abdominal or pelvic lymph nodes. Reproductive: Uterus and bilateral adnexa are unremarkable. Other: Additional wide mouth supraumbilical hernia containing nondilated transverse colon. No pneumoperitoneum. Musculoskeletal: No acute or significant osseous findings. IMPRESSION: 1. Small-bowel obstruction secondary to complex umbilical hernia. Small amount of ascites surrounding the herniated small bowel loops without wall thickening or pneumatosis. 2. Additional wide mouth supraumbilical hernia containing non-dilated transverse colon. 3. Chronically contracted gallbladder, similar in appearance to 2020. No evidence of cholelithiasis. 4. New mild central intrahepatic and proximal common bile duct biliary dilatation with pneumobilia. Correlate with LFTs and history of prior sphincterotomy. 5. Horseshoe kidney. 6.  Aortic Atherosclerosis (ICD10-I70.0). Electronically Signed   By: Vickki Hearing.D.  On: 11/19/2023 12:06   US Abdomen Limited  Result Date: 11/19/2023 CLINICAL DATA:  Right upper quadrant abdominal pain for 3 days EXAM: ULTRASOUND ABDOMEN LIMITED RIGHT UPPER QUADRANT COMPARISON:  08/07/2022 FINDINGS: Gallbladder: Gallbladder not well visualized, likely due to collapse configuration. Common bile duct: Diameter: 3 mm Liver: Parenchymal echogenicity: Diffusely increased in echogenicity. Contours: Normal Lesions: None Portal vein: Patent.  Hepatopetal flow Other: None. IMPRESSION: 1.  Diffuse increased echogenicity of the hepatic parenchyma is a nonspecific indicator of hepatocellular dysfunction, most commonly steatosis. 2. Unable to evaluate gallbladder, likely due to collapse configuration. It is possible that the gallbladder is filled with stones and sludge. There is continued concern for gallbladder pathology, further evaluation with contrast enhanced CT should be performed. Electronically Signed   By: Acquanetta Belling M.D.   On: 11/19/2023 10:23    Procedures Procedures    Medications Ordered in ED Medications  HYDROmorphone (DILAUDID) injection 1 mg (1 mg Intravenous Given 11/19/23 0850)  ondansetron (ZOFRAN) injection 4 mg (4 mg Intravenous Given 11/19/23 0851)  iohexol (OMNIPAQUE) 300 MG/ML solution 100 mL (100 mLs Intravenous Contrast Given 11/19/23 1114)    ED Course/ Medical Decision Making/ A&P                                 Medical Decision Making Amount and/or Complexity of Data Reviewed Labs: ordered. Radiology: ordered.  Risk Prescription drug management. Decision regarding hospitalization.   IV established will give IV pain medicine and antinausea medicine.  CBC no leukocytosis hemoglobin 15.5 platelets are 214 complete metabolic panel liver function test are normal total bili is 1 alk phos is 62 renal functions normal.  Lipase also normal at 21.  Will start with ultrasound right upper quadrant based on her past history and tenderness in the right upper quadrant if negative will proceed to CT scan abdomen pelvis.  Labs are very reassuring.  No liver function test abnormalities no leukocytosis.  Lipase normal as well.   Ultrasound without distinct abnormal findings common bile duct 3 mm.  With not able to evaluate the gallbladder very well because it somewhat collapsed.  They are actually recommending further evaluation with CT abdomen pelvis with IV contrast.  Will order that.  CT scan of the abdomen shows a complex umbilical hernia with small  bowel obstruction.  There is also a larger ventral hernia with some transverse colon it without obstruction.  Will discuss with general surgery.  Discussed with Dr. Lovell Sheehan.  Despite the CT findings patient is not acting like she has a significant small bowel obstruction.  She does have a complex ventral hernia that he feels he would like to make a referral to [facility] but does not have to happen today.  He knows that they do a specialized repair there.  He like to see her in the office next week.  Patient will be discharged with a prepack of hydrocodone and a prescription for some Zofran.  Clear liquid diet for couple days then advance to bland diet.  She will return for any new or worse symptoms.  I agree that patient does not seem to be in any distress.  On the abdominal wall there is clearly a ventral hernia has been there for several years.  It is nontender.   Final Clinical Impression(s) / ED Diagnoses Final diagnoses:  Right upper quadrant abdominal pain  SBO (small bowel obstruction) (HCC)  Umbilical hernia with obstruction  Rx / DC Orders ED Discharge Orders     None         Vanetta Mulders, MD 11/19/23 8119    Vanetta Mulders, MD 11/19/23 1101    Vanetta Mulders, MD 11/19/23 1238    Vanetta Mulders, MD 11/19/23 1345

## 2023-11-20 ENCOUNTER — Inpatient Hospital Stay (HOSPITAL_COMMUNITY)
Admission: EM | Admit: 2023-11-20 | Discharge: 2023-11-26 | DRG: 354 | Disposition: A | Discharge status: Home / Self Care | Payer: BC Managed Care – PPO | Attending: General Surgery | Admitting: General Surgery

## 2023-11-20 ENCOUNTER — Encounter (HOSPITAL_COMMUNITY)
Admission: EM | Disposition: A | Discharge status: Home / Self Care | Payer: Self-pay | Source: Home / Self Care | Attending: General Surgery

## 2023-11-20 ENCOUNTER — Other Ambulatory Visit: Payer: Self-pay

## 2023-11-20 ENCOUNTER — Emergency Department (HOSPITAL_COMMUNITY): Payer: BC Managed Care – PPO | Admitting: Anesthesiology

## 2023-11-20 ENCOUNTER — Emergency Department (HOSPITAL_COMMUNITY): Payer: BC Managed Care – PPO

## 2023-11-20 ENCOUNTER — Encounter (HOSPITAL_COMMUNITY): Payer: Self-pay | Admitting: Anesthesiology

## 2023-11-20 DIAGNOSIS — Z85828 Personal history of other malignant neoplasm of skin: Secondary | ICD-10-CM | POA: Diagnosis not present

## 2023-11-20 DIAGNOSIS — Z8249 Family history of ischemic heart disease and other diseases of the circulatory system: Secondary | ICD-10-CM

## 2023-11-20 DIAGNOSIS — I10 Essential (primary) hypertension: Secondary | ICD-10-CM | POA: Diagnosis present

## 2023-11-20 DIAGNOSIS — Z6841 Body Mass Index (BMI) 40.0 and over, adult: Secondary | ICD-10-CM | POA: Diagnosis not present

## 2023-11-20 DIAGNOSIS — K430 Incisional hernia with obstruction, without gangrene: Secondary | ICD-10-CM | POA: Diagnosis present

## 2023-11-20 DIAGNOSIS — K5651 Intestinal adhesions [bands], with partial obstruction: Secondary | ICD-10-CM | POA: Diagnosis present

## 2023-11-20 DIAGNOSIS — R112 Nausea with vomiting, unspecified: Secondary | ICD-10-CM | POA: Diagnosis not present

## 2023-11-20 DIAGNOSIS — K436 Other and unspecified ventral hernia with obstruction, without gangrene: Secondary | ICD-10-CM | POA: Diagnosis not present

## 2023-11-20 DIAGNOSIS — K219 Gastro-esophageal reflux disease without esophagitis: Secondary | ICD-10-CM | POA: Diagnosis present

## 2023-11-20 DIAGNOSIS — Z8049 Family history of malignant neoplasm of other genital organs: Secondary | ICD-10-CM

## 2023-11-20 DIAGNOSIS — E869 Volume depletion, unspecified: Secondary | ICD-10-CM | POA: Diagnosis present

## 2023-11-20 DIAGNOSIS — R0902 Hypoxemia: Secondary | ICD-10-CM | POA: Diagnosis not present

## 2023-11-20 DIAGNOSIS — Z4682 Encounter for fitting and adjustment of non-vascular catheter: Secondary | ICD-10-CM | POA: Diagnosis not present

## 2023-11-20 DIAGNOSIS — Z83438 Family history of other disorder of lipoprotein metabolism and other lipidemia: Secondary | ICD-10-CM

## 2023-11-20 DIAGNOSIS — E876 Hypokalemia: Secondary | ICD-10-CM | POA: Diagnosis present

## 2023-11-20 DIAGNOSIS — E66813 Obesity, class 3: Secondary | ICD-10-CM | POA: Diagnosis present

## 2023-11-20 DIAGNOSIS — Z803 Family history of malignant neoplasm of breast: Secondary | ICD-10-CM

## 2023-11-20 DIAGNOSIS — Z833 Family history of diabetes mellitus: Secondary | ICD-10-CM

## 2023-11-20 DIAGNOSIS — Z823 Family history of stroke: Secondary | ICD-10-CM | POA: Diagnosis not present

## 2023-11-20 DIAGNOSIS — R0989 Other specified symptoms and signs involving the circulatory and respiratory systems: Secondary | ICD-10-CM | POA: Diagnosis not present

## 2023-11-20 DIAGNOSIS — I517 Cardiomegaly: Secondary | ICD-10-CM | POA: Diagnosis not present

## 2023-11-20 DIAGNOSIS — K432 Incisional hernia without obstruction or gangrene: Secondary | ICD-10-CM | POA: Diagnosis present

## 2023-11-20 HISTORY — PX: INCISIONAL HERNIA REPAIR: SHX193

## 2023-11-20 LAB — COMPREHENSIVE METABOLIC PANEL
ALT: 20 U/L (ref 0–44)
AST: 23 U/L (ref 15–41)
Albumin: 4.4 g/dL (ref 3.5–5.0)
Alkaline Phosphatase: 54 U/L (ref 38–126)
Anion gap: 14 (ref 5–15)
BUN: 27 mg/dL — ABNORMAL HIGH (ref 8–23)
CO2: 25 mmol/L (ref 22–32)
Calcium: 9.7 mg/dL (ref 8.9–10.3)
Chloride: 98 mmol/L (ref 98–111)
Creatinine, Ser: 0.84 mg/dL (ref 0.44–1.00)
GFR, Estimated: >60 mL/min (ref >60)
Glucose, Bld: 169 mg/dL — ABNORMAL HIGH (ref 70–99)
Potassium: 3.4 mmol/L — ABNORMAL LOW (ref 3.5–5.1)
Sodium: 137 mmol/L (ref 135–145)
Total Bilirubin: 1.4 mg/dL — ABNORMAL HIGH (ref <1.2)
Total Protein: 8.1 g/dL (ref 6.5–8.1)

## 2023-11-20 LAB — LACTIC ACID, PLASMA: Lactic Acid, Venous: 1.4 mmol/L (ref 0.5–1.9)

## 2023-11-20 LAB — CBC WITH DIFFERENTIAL/PLATELET
Abs Immature Granulocytes: 0 10*3/uL (ref 0.00–0.07)
Basophils Absolute: 0 10*3/uL (ref 0.0–0.1)
Basophils Relative: 1 %
Eosinophils Absolute: 0 10*3/uL (ref 0.0–0.5)
Eosinophils Relative: 1 %
HCT: 47.2 % — ABNORMAL HIGH (ref 36.0–46.0)
Hemoglobin: 16.2 g/dL — ABNORMAL HIGH (ref 12.0–15.0)
Immature Granulocytes: 0 %
Lymphocytes Relative: 18 %
Lymphs Abs: 0.5 10*3/uL — ABNORMAL LOW (ref 0.7–4.0)
MCH: 31.6 pg (ref 26.0–34.0)
MCHC: 34.3 g/dL (ref 30.0–36.0)
MCV: 92 fL (ref 80.0–100.0)
Monocytes Absolute: 0.5 10*3/uL (ref 0.1–1.0)
Monocytes Relative: 18 %
Neutro Abs: 1.6 10*3/uL — ABNORMAL LOW (ref 1.7–7.7)
Neutrophils Relative %: 62 %
Platelets: 216 10*3/uL (ref 150–400)
RBC: 5.13 MIL/uL — ABNORMAL HIGH (ref 3.87–5.11)
RDW: 12.8 % (ref 11.5–15.5)
WBC: 2.5 10*3/uL — ABNORMAL LOW (ref 4.0–10.5)
nRBC: 0 % (ref 0.0–0.2)

## 2023-11-20 LAB — URINALYSIS, ROUTINE W REFLEX MICROSCOPIC
Bacteria, UA: NONE SEEN
Bilirubin Urine: NEGATIVE
Glucose, UA: NEGATIVE mg/dL
Ketones, ur: 20 mg/dL — AB
Leukocytes,Ua: NEGATIVE
Nitrite: NEGATIVE
Protein, ur: 100 mg/dL — AB
Specific Gravity, Urine: 1.031 — ABNORMAL HIGH (ref 1.005–1.030)
pH: 5 (ref 5.0–8.0)

## 2023-11-20 LAB — LIPASE, BLOOD: Lipase: 27 U/L (ref 11–51)

## 2023-11-20 SURGERY — REPAIR, HERNIA, INCISIONAL
Anesthesia: General | Site: Abdomen

## 2023-11-20 MED ORDER — ONDANSETRON 4 MG PO TBDP
4.0000 mg | ORAL_TABLET | Freq: Four times a day (QID) | ORAL | Status: DC | PRN
  Administered 2023-11-24 – 2023-11-25 (×3): 4 mg via ORAL
  Filled 2023-11-20 (×3): qty 1

## 2023-11-20 MED ORDER — FENTANYL CITRATE PF 50 MCG/ML IJ SOSY
25.0000 ug | PREFILLED_SYRINGE | INTRAMUSCULAR | Status: DC | PRN
  Administered 2023-11-20 (×2): 50 ug via INTRAVENOUS
  Filled 2023-11-20 (×2): qty 1

## 2023-11-20 MED ORDER — CEFAZOLIN IN SODIUM CHLORIDE 3-0.9 GM/100ML-% IV SOLN
3.0000 g | INTRAVENOUS | Status: AC
  Administered 2023-11-20: 3 g via INTRAVENOUS

## 2023-11-20 MED ORDER — PHENYLEPHRINE 80 MCG/ML (10ML) SYRINGE FOR IV PUSH (FOR BLOOD PRESSURE SUPPORT)
PREFILLED_SYRINGE | INTRAVENOUS | Status: AC
  Filled 2023-11-20: qty 10

## 2023-11-20 MED ORDER — LACTATED RINGERS IV SOLN: INTRAVENOUS | Status: DC

## 2023-11-20 MED ORDER — ONDANSETRON HCL 4 MG/2ML IJ SOLN
4.0000 mg | Freq: Once | INTRAMUSCULAR | Status: AC
  Administered 2023-11-20: 4 mg via INTRAVENOUS
  Filled 2023-11-20: qty 2

## 2023-11-20 MED ORDER — LIDOCAINE HCL (PF) 2 % IJ SOLN
INTRAMUSCULAR | Status: DC | PRN
  Administered 2023-11-20: 100 mg via INTRADERMAL

## 2023-11-20 MED ORDER — PROPOFOL 10 MG/ML IV BOLUS
INTRAVENOUS | Status: DC | PRN
  Administered 2023-11-20: 150 mg via INTRAVENOUS

## 2023-11-20 MED ORDER — DIPHENHYDRAMINE HCL 50 MG/ML IJ SOLN: 12.5000 mg | Freq: Four times a day (QID) | INTRAMUSCULAR | Status: DC | PRN

## 2023-11-20 MED ORDER — PHENYLEPHRINE 80 MCG/ML (10ML) SYRINGE FOR IV PUSH (FOR BLOOD PRESSURE SUPPORT)
PREFILLED_SYRINGE | INTRAVENOUS | Status: DC | PRN
  Administered 2023-11-20: 80 ug via INTRAVENOUS

## 2023-11-20 MED ORDER — SIMETHICONE 80 MG PO CHEW: 40.0000 mg | CHEWABLE_TABLET | Freq: Four times a day (QID) | ORAL | Status: DC | PRN

## 2023-11-20 MED ORDER — ONDANSETRON HCL 4 MG/2ML IJ SOLN
4.0000 mg | Freq: Four times a day (QID) | INTRAMUSCULAR | Status: DC | PRN
  Administered 2023-11-21 – 2023-11-24 (×10): 4 mg via INTRAVENOUS
  Filled 2023-11-20 (×9): qty 2

## 2023-11-20 MED ORDER — LACTATED RINGERS IV SOLN: INTRAVENOUS | Status: DC | PRN

## 2023-11-20 MED ORDER — KETOROLAC TROMETHAMINE 30 MG/ML IJ SOLN
30.0000 mg | Freq: Four times a day (QID) | INTRAMUSCULAR | Status: AC
  Administered 2023-11-20: 30 mg via INTRAVENOUS
  Filled 2023-11-20: qty 1

## 2023-11-20 MED ORDER — METHOCARBAMOL 500 MG PO TABS
500.0000 mg | ORAL_TABLET | Freq: Four times a day (QID) | ORAL | Status: DC | PRN
  Administered 2023-11-20 – 2023-11-21 (×2): 500 mg via ORAL
  Filled 2023-11-20 (×2): qty 1

## 2023-11-20 MED ORDER — BUPIVACAINE HCL (PF) 0.5 % IJ SOLN
INTRAMUSCULAR | Status: DC | PRN
  Administered 2023-11-20: 30 mL

## 2023-11-20 MED ORDER — ENOXAPARIN SODIUM 40 MG/0.4ML IJ SOSY
PREFILLED_SYRINGE | INTRAMUSCULAR | Status: AC
  Administered 2023-11-20: 40 mg via SUBCUTANEOUS
  Filled 2023-11-20: qty 0.4

## 2023-11-20 MED ORDER — OXYCODONE HCL 5 MG PO TABS
5.0000 mg | ORAL_TABLET | Freq: Once | ORAL | Status: AC | PRN
  Administered 2023-11-20: 5 mg via ORAL
  Filled 2023-11-20: qty 1

## 2023-11-20 MED ORDER — LIDOCAINE HCL (PF) 2 % IJ SOLN
INTRAMUSCULAR | Status: AC
  Filled 2023-11-20: qty 10

## 2023-11-20 MED ORDER — OXYCODONE HCL 5 MG PO TABS
5.0000 mg | ORAL_TABLET | ORAL | Status: DC | PRN
  Administered 2023-11-20 – 2023-11-21 (×4): 5 mg via ORAL
  Administered 2023-11-22 – 2023-11-24 (×5): 10 mg via ORAL
  Administered 2023-11-25 (×2): 5 mg via ORAL
  Filled 2023-11-20 (×2): qty 2
  Filled 2023-11-20: qty 1
  Filled 2023-11-20: qty 2
  Filled 2023-11-20: qty 1
  Filled 2023-11-20 (×2): qty 2
  Filled 2023-11-20: qty 1
  Filled 2023-11-20 (×3): qty 2
  Filled 2023-11-20: qty 1
  Filled 2023-11-20: qty 2
  Filled 2023-11-20: qty 1

## 2023-11-20 MED ORDER — DEXAMETHASONE SODIUM PHOSPHATE 10 MG/ML IJ SOLN
INTRAMUSCULAR | Status: DC | PRN
  Administered 2023-11-20: 4 mg via INTRAVENOUS

## 2023-11-20 MED ORDER — ROCURONIUM BROMIDE 10 MG/ML (PF) SYRINGE
PREFILLED_SYRINGE | INTRAVENOUS | Status: AC
  Filled 2023-11-20: qty 10

## 2023-11-20 MED ORDER — ENOXAPARIN SODIUM 40 MG/0.4ML IJ SOSY: 40.0000 mg | PREFILLED_SYRINGE | Freq: Once | INTRAMUSCULAR | Status: AC

## 2023-11-20 MED ORDER — FENTANYL CITRATE (PF) 100 MCG/2ML IJ SOLN
INTRAMUSCULAR | Status: AC
  Filled 2023-11-20: qty 2

## 2023-11-20 MED ORDER — ENOXAPARIN SODIUM 40 MG/0.4ML IJ SOSY
40.0000 mg | PREFILLED_SYRINGE | INTRAMUSCULAR | Status: DC
  Administered 2023-11-21 – 2023-11-25 (×5): 40 mg via SUBCUTANEOUS
  Filled 2023-11-20 (×6): qty 0.4

## 2023-11-20 MED ORDER — HYDROMORPHONE HCL 1 MG/ML IJ SOLN
1.0000 mg | INTRAMUSCULAR | Status: DC | PRN
  Administered 2023-11-21 – 2023-11-23 (×11): 1 mg via INTRAVENOUS
  Filled 2023-11-20 (×12): qty 1

## 2023-11-20 MED ORDER — ACETAMINOPHEN 325 MG PO TABS
650.0000 mg | ORAL_TABLET | Freq: Four times a day (QID) | ORAL | Status: DC | PRN
  Administered 2023-11-20 – 2023-11-25 (×2): 650 mg via ORAL
  Filled 2023-11-20: qty 2

## 2023-11-20 MED ORDER — CEFAZOLIN IN SODIUM CHLORIDE 3-0.9 GM/100ML-% IV SOLN
INTRAVENOUS | Status: AC
  Filled 2023-11-20: qty 100

## 2023-11-20 MED ORDER — SUGAMMADEX SODIUM 200 MG/2ML IV SOLN
INTRAVENOUS | Status: DC | PRN
  Administered 2023-11-20: 300 mg via INTRAVENOUS

## 2023-11-20 MED ORDER — ONDANSETRON HCL 4 MG/2ML IJ SOLN: 4.0000 mg | Freq: Once | INTRAMUSCULAR | Status: DC | PRN

## 2023-11-20 MED ORDER — CHLORHEXIDINE GLUCONATE CLOTH 2 % EX PADS: 6.0000 | MEDICATED_PAD | Freq: Once | CUTANEOUS | Status: DC

## 2023-11-20 MED ORDER — SEVOFLURANE IN SOLN
RESPIRATORY_TRACT | Status: AC
  Filled 2023-11-20: qty 500

## 2023-11-20 MED ORDER — CHLORHEXIDINE GLUCONATE CLOTH 2 % EX PADS
6.0000 | MEDICATED_PAD | Freq: Once | CUTANEOUS | Status: AC
Start: 2023-11-20 — End: 2023-11-20
  Administered 2023-11-20: 6 via TOPICAL

## 2023-11-20 MED ORDER — DIPHENHYDRAMINE HCL 12.5 MG/5ML PO ELIX: 12.5000 mg | ORAL_SOLUTION | Freq: Four times a day (QID) | ORAL | Status: DC | PRN

## 2023-11-20 MED ORDER — ACETAMINOPHEN 650 MG RE SUPP: 650.0000 mg | Freq: Four times a day (QID) | RECTAL | Status: DC | PRN

## 2023-11-20 MED ORDER — KETOROLAC TROMETHAMINE 30 MG/ML IJ SOLN
30.0000 mg | Freq: Four times a day (QID) | INTRAMUSCULAR | Status: AC | PRN
  Administered 2023-11-21 – 2023-11-24 (×5): 30 mg via INTRAVENOUS
  Filled 2023-11-20 (×5): qty 1

## 2023-11-20 MED ORDER — CHLORHEXIDINE GLUCONATE 0.12 % MT SOLN: 15.0000 mL | Freq: Once | OROMUCOSAL | Status: AC

## 2023-11-20 MED ORDER — MIDAZOLAM HCL 2 MG/2ML IJ SOLN
INTRAMUSCULAR | Status: AC
  Filled 2023-11-20: qty 2

## 2023-11-20 MED ORDER — DEXTROSE 5 % IV SOLN: INTRAVENOUS | Status: DC | PRN

## 2023-11-20 MED ORDER — 0.9 % SODIUM CHLORIDE (POUR BTL) OPTIME
TOPICAL | Status: DC | PRN
  Administered 2023-11-20: 1000 mL

## 2023-11-20 MED ORDER — ONDANSETRON HCL 4 MG/2ML IJ SOLN
INTRAMUSCULAR | Status: DC | PRN
  Administered 2023-11-20: 4 mg via INTRAVENOUS

## 2023-11-20 MED ORDER — SODIUM CHLORIDE 0.9 % IV BOLUS
1000.0000 mL | Freq: Once | INTRAVENOUS | Status: AC
  Administered 2023-11-20: 1000 mL via INTRAVENOUS

## 2023-11-20 MED ORDER — ROCURONIUM BROMIDE 10 MG/ML (PF) SYRINGE
PREFILLED_SYRINGE | INTRAVENOUS | Status: DC | PRN
  Administered 2023-11-20: 10 mg via INTRAVENOUS
  Administered 2023-11-20: 40 mg via INTRAVENOUS
  Administered 2023-11-20: 10 mg via INTRAVENOUS

## 2023-11-20 MED ORDER — HYDROMORPHONE HCL 1 MG/ML IJ SOLN
1.0000 mg | Freq: Once | INTRAMUSCULAR | Status: AC
  Administered 2023-11-20: 1 mg via INTRAVENOUS
  Filled 2023-11-20: qty 1

## 2023-11-20 MED ORDER — SUCCINYLCHOLINE CHLORIDE 200 MG/10ML IV SOSY
PREFILLED_SYRINGE | INTRAVENOUS | Status: DC | PRN
  Administered 2023-11-20: 120 mg via INTRAVENOUS

## 2023-11-20 MED ORDER — OXYCODONE HCL 5 MG/5ML PO SOLN
5.0000 mg | Freq: Once | ORAL | Status: AC | PRN
Start: 2023-11-20 — End: 2023-11-20

## 2023-11-20 MED ORDER — CHLORHEXIDINE GLUCONATE 0.12 % MT SOLN
OROMUCOSAL | Status: AC
  Administered 2023-11-20: 15 mL via OROMUCOSAL
  Filled 2023-11-20: qty 15

## 2023-11-20 MED ORDER — FENTANYL CITRATE (PF) 100 MCG/2ML IJ SOLN
INTRAMUSCULAR | Status: DC | PRN
  Administered 2023-11-20 (×3): 50 ug via INTRAVENOUS
  Administered 2023-11-20: 100 ug via INTRAVENOUS
  Administered 2023-11-20: 50 ug via INTRAVENOUS

## 2023-11-20 MED ORDER — HYDROXYZINE HCL 10 MG PO TABS
10.0000 mg | ORAL_TABLET | Freq: Two times a day (BID) | ORAL | Status: DC | PRN
  Administered 2023-11-24: 10 mg via ORAL
  Filled 2023-11-20: qty 1

## 2023-11-20 MED ORDER — BUPIVACAINE HCL (PF) 0.5 % IJ SOLN
INTRAMUSCULAR | Status: AC
  Filled 2023-11-20: qty 30

## 2023-11-20 MED ORDER — MIDAZOLAM HCL 2 MG/2ML IJ SOLN
INTRAMUSCULAR | Status: DC | PRN
  Administered 2023-11-20: 2 mg via INTRAVENOUS

## 2023-11-20 MED FILL — Hydrocodone-Acetaminophen Tab 5-325 MG: ORAL | Qty: 6 | Status: AC

## 2023-11-20 SURGICAL SUPPLY — 39 items
BINDER ABDOMINAL 12 XL 75-84 (SOFTGOODS) IMPLANT
BLADE SURG 15 STRL LF DISP TIS (BLADE) IMPLANT
CHLORAPREP W/TINT 26 (MISCELLANEOUS) ×2 IMPLANT
CLOTH BEACON ORANGE TIMEOUT ST (SAFETY) ×2 IMPLANT
COVER LIGHT HANDLE STERIS (MISCELLANEOUS) ×2 IMPLANT
DRSG OPSITE POSTOP 4X10 (GAUZE/BANDAGES/DRESSINGS) IMPLANT
ELECT REM PT RETURN 9FT ADLT (ELECTROSURGICAL) ×1
ELECTRODE REM PT RTRN 9FT ADLT (ELECTROSURGICAL) ×1 IMPLANT
EVACUATOR DRAINAGE 10X20 100CC (DRAIN) IMPLANT
GAUZE 4X4 16PLY ~~LOC~~+RFID DBL (SPONGE) ×1 IMPLANT
GLOVE BIO SURGEON STRL SZ7 (GLOVE) IMPLANT
GLOVE BIOGEL PI IND STRL 7.0 (GLOVE) ×2 IMPLANT
GLOVE BIOGEL PI IND STRL 7.5 (GLOVE) IMPLANT
GLOVE SURG SS PI 7.0 STRL IVOR (GLOVE) IMPLANT
GLOVE SURG SS PI 7.5 STRL IVOR (GLOVE) ×2 IMPLANT
GOWN STRL REUS W/TWL LRG LVL3 (GOWN DISPOSABLE) ×3 IMPLANT
INST SET MAJOR GENERAL (KITS) IMPLANT
KIT TURNOVER KIT A (KITS) ×1 IMPLANT
LIGASURE IMPACT 36 18CM CVD LR (INSTRUMENTS) IMPLANT
MANIFOLD NEPTUNE II (INSTRUMENTS) ×2 IMPLANT
MESH VENTRALIGHT ST 8X10 (Mesh General) IMPLANT
NDL HYPO 21X1.5 SAFETY (NEEDLE) ×1 IMPLANT
NEEDLE HYPO 21X1.5 SAFETY (NEEDLE) ×1 IMPLANT
NS IRRIG 1000ML POUR BTL (IV SOLUTION) ×1 IMPLANT
PACK MAJOR ABDOMINAL (CUSTOM PROCEDURE TRAY) IMPLANT
PAD ARMBOARD 7.5X6 YLW CONV (MISCELLANEOUS) ×1 IMPLANT
POSITIONER HEAD 8X9X4 ADT (SOFTGOODS) ×1 IMPLANT
SET BASIN LINEN APH (SET/KITS/TRAYS/PACK) ×1 IMPLANT
SPONGE DRAIN TRACH 4X4 STRL 2S (GAUZE/BANDAGES/DRESSINGS) IMPLANT
STAPLER VISISTAT (STAPLE) IMPLANT
SUT ETHIBOND 0 MO6 C/R (SUTURE) IMPLANT
SUT ETHILON 3 0 FSL (SUTURE) IMPLANT
SUT MNCRL AB 4-0 PS2 18 (SUTURE) ×2 IMPLANT
SUT PDS AB 0 CTX 60 (SUTURE) IMPLANT
SUT PROLENE 2 0 CR (SUTURE) IMPLANT
SUT SILK 3 0 SH CR/8 (SUTURE) IMPLANT
SUT VIC AB 2-0 CT1 TAPERPNT 27 (SUTURE) ×1 IMPLANT
SUT VIC AB 3-0 SH 27X BRD (SUTURE) ×1 IMPLANT
SYR 30ML LL (SYRINGE) ×2 IMPLANT

## 2023-11-20 NOTE — ED Notes (Signed)
Pt aware of need for UA, unable to provide sample at this time.

## 2023-11-20 NOTE — Op Note (Signed)
Patient:  Miranda Aguilar  DOB:  [birth year]  MRN:  [id number]   Preop Diagnosis: Partial small bowel obstruction, incarcerated recurrent incisional hernia  Postop Diagnosis: Same  Procedure: Recurrent incisional herniorrhaphy with mesh  Surgeon: Franky Macho, MD  Anes: General Endotracheal  Indications: Patient is a 62 year old white female who presents back to the emergency room with an incarcerated incisional hernia with evidence of a partial small bowel obstruction.  She has a known history of hernia repair in the past.  The risks and benefits of the procedure including bleeding, infection, mesh use, the possibility of recurrence of the hernia were fully explained to the patient, who gave informed consent.  Procedure note: The patient was placed in the supine position.  After induction of general endotracheal anesthesia, the abdomen was prepped and draped using the usual sterile technique with ChloraPrep.  Surgical site confirmation was performed.  A midline incision was made through the previous surgical midline scar.  This was taken down to the fascia.  I was able to enter the abdominal cavity and realized that she had multiple Swiss cheeselike hernias along her midline.  I was able to reduce both the transverse colon as well as the small bowel.  All the hernias were made into 1 large defect which measured approximately 12 cm in its greatest length.  Multiple adhesions were lysed between the anterior abdominal wall and the small bowel.  A small superficial serosal tear was noted in the proximal colon and this was closed using a 3-0 silk Lembert suture.  I limitedly was able to run the small bowel but given the dense adhesions, I could not completely get to the ligament of Treitz.  No critical strictures could be found on the exploration.  I then proceeded to repair the hernia with a 20 x 25 cm Bard Ventralight dual mesh.  It was attached to the abdominal wall circumferentially with 2-0  Prolene interrupted sutures.  The fascia was then released and closed longitudinally using a looped 0 PDS running suture.  Any remaining hernia sacs were excised to decrease the chance of a seroma formation.  A #10 flat Jackson-Pratt drain was placed in the subcutaneous tissue and brought out through a separate stab wound to the right of the incision.  It was secured at the skin level using a 3-0 nylon interrupted suture.  0.5% Sensorcaine was instilled into the surrounding wound.  The skin was closed using staples.  Triple antibiotic ointment and dry sterile dressing were applied.  An abdominal binder was also applied.  All tape and needle counts were correct at the end of the procedure.  The patient was extubated in the operating room and transferred to PACU in stable condition.  Complications: None  EBL: 75 cc  Specimen: None  Drains: Jackson-Pratt drain to subcutaneous tissue

## 2023-11-20 NOTE — H&P (Signed)
Miranda Aguilar is an 62 y.o. female.   Chief Complaint: Incisional hernia with partial small bowel obstruction HPI: Patient is a 62 year old white female who presents back to the emergency room with nausea and vomiting.  She was seen in the emergency room yesterday and noted to have a partial small bowel obstruction with an incisional hernia which was complex in nature and causing the partial small bowel obstruction.  We tried conservative measures but this has failed.  She now presents back with ongoing nausea and vomiting.  Attempts to transfer the patient to a tertiary care center was not successful.  She is status post multiple abdominal surgeries in the past including an umbilical hernia repair at another facility.  She denies any fever or chills.  Past Medical History:  Diagnosis Date   Arthritis    Basal cell carcinoma 2019   nose   Chicken pox    Complication of anesthesia    bradycardia (low 40's)during colonoscopy   Constipation    Occasionally   Gallstone ileus (HCC)    GERD (gastroesophageal reflux disease)    Heartburn   Hypertension    01/2015 - lost weight   Obesity     Past Surgical History:  Procedure Laterality Date   COLONOSCOPY N/A 05/22/2015   Procedure: COLONOSCOPY;  Surgeon: West Bali, MD;  Location: [facility];  Service: Endoscopy;  Laterality: N/A;  8:30 AM   HERNIA REPAIR     x 2   HYSTEROSCOPY WITH D & C N/A 05/03/2022   Procedure: FRACTIONAL DILATATION AND CURETTAGE Miranda Aguilar;  Surgeon: Schermerhorn, Ihor Austin, MD;  Location: [facility];  Service: Gynecology;  Laterality: N/A;   INSERTION OF MESH  06/20/2017   Procedure: INSERTION OF MESH;  Surgeon: Kieth Brightly, MD;  Location: [facility];  Service: General;;  umbilical    LAPAROTOMY N/A 02/01/2015   Procedure: EXPLORATORY LAPAROTOMY, MECKEL'S DIVERTICULECTOMY;  Surgeon: Dalia Heading, MD;  Location: [facility];  Service: General;  Laterality: N/A;   TONSILLECTOMY     UMBILICAL  HERNIA REPAIR N/A 06/20/2017   Procedure: HERNIA REPAIR UMBILICAL ADULT;  Surgeon: Kieth Brightly, MD;  Location: [facility];  Service: General;  Laterality: N/A;   WISDOM TOOTH EXTRACTION      Family History  Problem Relation Age of Onset   Diabetes Mother    Hyperlipidemia Mother    Heart disease Mother        CHF   Cervical cancer Mother    Stroke Father    Diabetes Sister    Osteoporosis Sister    Diabetes Brother    Breast cancer Paternal Aunt 62   Social History:  reports that she has never smoked. She has never used smokeless tobacco. She reports current alcohol use of about 5.0 standard drinks of alcohol per week. She reports that she does not use drugs.  Allergies: No Known Allergies  (Not in a hospital admission)   Results for orders placed or performed during the hospital encounter of 11/20/23 (from the past 48 hours)  Comprehensive metabolic panel     Status: Abnormal   Collection Time: 11/20/23  8:23 AM  Result Value Ref Range   Sodium 137 135 - 145 mmol/L   Potassium 3.4 (L) 3.5 - 5.1 mmol/L   Chloride 98 98 - 111 mmol/L   CO2 25 22 - 32 mmol/L   Glucose, Bld 169 (H) 70 - 99 mg/dL    Comment: Glucose reference range applies only to  samples taken after fasting for at least 8 hours.   BUN 27 (H) 8 - 23 mg/dL   Creatinine, Ser 0.98 0.44 - 1.00 mg/dL   Calcium 9.7 8.9 - 11.9 mg/dL   Total Protein 8.1 6.5 - 8.1 g/dL   Albumin 4.4 3.5 - 5.0 g/dL   AST 23 15 - 41 U/L   ALT 20 0 - 44 U/L   Alkaline Phosphatase 54 38 - 126 U/L   Total Bilirubin 1.4 (H) <1.2 mg/dL   GFR, Estimated >14 >78 mL/min    Comment: (NOTE) Calculated using the CKD-EPI Creatinine Equation (2021)    Anion gap 14 5 - 15    Comment: Performed at [facility], [address] [place], [place] [address]  Lipase, blood     Status: None   Collection Time: 11/20/23  8:23 AM  Result Value Ref Range   Lipase 27 11 - 51 U/L    Comment: Performed at [facility], [address],  [place], [place] [address]  CBC with Differential     Status: Abnormal   Collection Time: 11/20/23  8:23 AM  Result Value Ref Range   WBC 2.5 (L) 4.0 - 10.5 K/uL   RBC 5.13 (H) 3.87 - 5.11 MIL/uL   Hemoglobin 16.2 (H) 12.0 - 15.0 g/dL   HCT 57.8 (H) 46.9 - 62.9 %   MCV 92.0 80.0 - 100.0 fL   MCH 31.6 26.0 - 34.0 pg   MCHC 34.3 30.0 - 36.0 g/dL   RDW 52.8 41.3 - 24.4 %   Platelets 216 150 - 400 K/uL   nRBC 0.0 0.0 - 0.2 %   Neutrophils Relative % 62 %   Neutro Abs 1.6 (L) 1.7 - 7.7 K/uL   Lymphocytes Relative 18 %   Lymphs Abs 0.5 (L) 0.7 - 4.0 K/uL   Monocytes Relative 18 %   Monocytes Absolute 0.5 0.1 - 1.0 K/uL   Eosinophils Relative 1 %   Eosinophils Absolute 0.0 0.0 - 0.5 K/uL   Basophils Relative 1 %   Basophils Absolute 0.0 0.0 - 0.1 K/uL   Immature Granulocytes 0 %   Abs Immature Granulocytes 0.00 0.00 - 0.07 K/uL    Comment: Performed at [facility], [address], [place], [place] [address]  Lactic acid, plasma     Status: None   Collection Time: 11/20/23  8:23 AM  Result Value Ref Range   Lactic Acid, Venous 1.4 0.5 - 1.9 mmol/L    Comment: Performed at [facility], [address] [place], [place] [address]   DG Chest Port 1 View Result Date: 11/20/2023 CLINICAL DATA:  hypoxia EXAM: PORTABLE CHEST 1 VIEW COMPARISON:  CXR 01/30/2015 FINDINGS: Low lung volumes. Cardiomegaly. No focal airspace opacity. No radiographically apparent displaced rib fracture. Visualized upper abdomen is unremarkable. No pleural effusion. No pneumothorax. IMPRESSION: 1. Cardiomegaly. If there is clinical concern for a pericardial effusion, further evaluation with echocardiography is recommended. 2.  No focal airspace opacity. Electronically Signed   By: Lorenza Cambridge M.D.   On: 11/20/2023 09:08   CT ABDOMEN PELVIS W CONTRAST Result Date: 11/19/2023 CLINICAL DATA:  Intermittent right-sided abdominal pain since Monday. EXAM: CT ABDOMEN AND PELVIS WITH CONTRAST TECHNIQUE: Multidetector CT imaging of  the abdomen and pelvis was performed using the standard protocol following bolus administration of intravenous contrast. RADIATION DOSE REDUCTION: This exam was performed according to the departmental dose-optimization program which includes automated exposure control, adjustment of the mA and/or kV according to patient size  and/or use of iterative reconstruction technique. CONTRAST:  OMNIPAQUE IOHEXOL 300 MG/ML  SOLN COMPARISON:  Right upper quadrant ultrasound from same day. CT abdomen pelvis dated January 06, 2019. FINDINGS: Lower chest: No acute abnormality. Hepatobiliary: No focal liver abnormality. Chronically contracted gallbladder, unchanged in appearance since 2020. No radiopaque gallstones or wall thickening. New mild central intrahepatic and proximal common bile duct biliary dilatation with pneumobilia. Pancreas: Unremarkable. No pancreatic ductal dilatation or surrounding inflammatory changes. Spleen: Normal in size without focal abnormality. Adrenals/Urinary Tract: Adrenal glands are unremarkable. Horseshoe kidney again noted. No renal mass, calculi, or hydronephrosis. Small right posterolateral bladder diverticulum again noted. The bladder is otherwise unremarkable. Stomach/Bowel: Distended stomach. Multiple dilated loops of small bowel, with multiple transition points involving a complex umbilical hernia. There is a small amount of ascites surrounding the herniated small bowel loops without wall thickening or pneumatosis. The distal small bowel is decompressed. Majority of the colon is decompressed. Diminutive or absent appendix. Left-sided colonic diverticulosis. Vascular/Lymphatic: Aortic atherosclerosis. No enlarged abdominal or pelvic lymph nodes. Reproductive: Uterus and bilateral adnexa are unremarkable. Other: Additional wide mouth supraumbilical hernia containing nondilated transverse colon. No pneumoperitoneum. Musculoskeletal: No acute or significant osseous findings. IMPRESSION: 1.  Small-bowel obstruction secondary to complex umbilical hernia. Small amount of ascites surrounding the herniated small bowel loops without wall thickening or pneumatosis. 2. Additional wide mouth supraumbilical hernia containing non-dilated transverse colon. 3. Chronically contracted gallbladder, similar in appearance to 2020. No evidence of cholelithiasis. 4. New mild central intrahepatic and proximal common bile duct biliary dilatation with pneumobilia. Correlate with LFTs and history of prior sphincterotomy. 5. Horseshoe kidney. 6.  Aortic Atherosclerosis (ICD10-I70.0). Electronically Signed   By: Obie Dredge M.D.   On: 11/19/2023 12:06   US Abdomen Limited Result Date: 11/19/2023 CLINICAL DATA:  Right upper quadrant abdominal pain for 3 days EXAM: ULTRASOUND ABDOMEN LIMITED RIGHT UPPER QUADRANT COMPARISON:  08/07/2022 FINDINGS: Gallbladder: Gallbladder not well visualized, likely due to collapse configuration. Common bile duct: Diameter: 3 mm Liver: Parenchymal echogenicity: Diffusely increased in echogenicity. Contours: Normal Lesions: None Portal vein: Patent.  Hepatopetal flow Other: None. IMPRESSION: 1. Diffuse increased echogenicity of the hepatic parenchyma is a nonspecific indicator of hepatocellular dysfunction, most commonly steatosis. 2. Unable to evaluate gallbladder, likely due to collapse configuration. It is possible that the gallbladder is filled with stones and sludge. There is continued concern for gallbladder pathology, further evaluation with contrast enhanced CT should be performed. Electronically Signed   By: Acquanetta Belling M.D.   On: 11/19/2023 10:23    Review of Systems  Constitutional:  Positive for fatigue.  HENT: Negative.    Eyes: Negative.   Respiratory: Negative.    Cardiovascular: Negative.   Gastrointestinal:  Positive for abdominal pain, nausea and vomiting.  Endocrine: Negative.   Genitourinary: Negative.   Musculoskeletal: Negative.   Skin: Negative.    Allergic/Immunologic: Negative.   Neurological: Negative.   Hematological: Negative.   Psychiatric/Behavioral: Negative.      Blood pressure 128/81, pulse 93, temperature 98 F (36.7 C), resp. rate 12, last menstrual period 12/16/2017, SpO2 90%. Physical Exam Vitals reviewed.  Constitutional:      Appearance: She is obese. She is not ill-appearing.  HENT:     Head: Normocephalic and atraumatic.  Cardiovascular:     Rate and Rhythm: Normal rate and regular rhythm.     Heart sounds: Normal heart sounds. No murmur heard.    No friction rub. No gallop.  Pulmonary:     Effort: Pulmonary  effort is normal. No respiratory distress.     Breath sounds: Normal breath sounds. No stridor. No wheezing, rhonchi or rales.  Abdominal:     General: Bowel sounds are normal. There is no distension.     Palpations: Abdomen is soft. There is no mass.     Tenderness: There is no abdominal tenderness. There is no guarding or rebound.     Hernia: A hernia is present.     Comments: Large partially reducible incisional hernia along midline incision.  Skin:    General: Skin is warm and dry.  Neurological:     Mental Status: She is alert and oriented to person, place, and time.     CT scan images personally reviewed Assessment/Plan Impression: Partial small bowel obstruction secondary to incisional hernia with probable incarceration.  No evidence of strangulation. Plan: Patient will be brought to the operating room for an incisional herniorrhaphy with mesh.  The risks and benefits of the procedure including bleeding, infection, mesh use, and the possibility of recurrence of the hernia were fully explained to the patient, who gave informed consent.  Franky Macho, MD 11/20/2023, 11:08 AM

## 2023-11-20 NOTE — Anesthesia Procedure Notes (Signed)
Procedure Name: Intubation Date/Time: 11/20/2023 12:08 PM  Performed by: Julian Reil, CRNAPre-anesthesia Checklist: Patient identified, Emergency Drugs available, Suction available and Patient being monitored Patient Re-evaluated:Patient Re-evaluated prior to induction Oxygen Delivery Method: Circle system utilized Preoxygenation: Pre-oxygenation with 100% oxygen Induction Type: IV induction, Rapid sequence and Cricoid Pressure applied Laryngoscope Size: Glidescope and 3 Grade View: Grade I Tube type: Oral Tube size: 7.0 mm Number of attempts: 1 Airway Equipment and Method: Stylet and Video-laryngoscopy Placement Confirmation: ETT inserted through vocal cords under direct vision, positive ETCO2 and breath sounds checked- equal and bilateral Secured at: 22 cm Tube secured with: Tape Dental Injury: Teeth and Oropharynx as per pre-operative assessment  Comments: Glidescope used due to RSI planned and C/O nausea in Pre-op.

## 2023-11-20 NOTE — ED Notes (Signed)
Pt placed on 2 L oxygen via [place] due to SpO2 dropping to the low 80s, on 2 L SpO2 was in the mid 80s, then placed pt on 3 L [place] and informed pt to continue deep breathing and SpO2 96%--MD made aware

## 2023-11-20 NOTE — ED Triage Notes (Signed)
Pt c/o abd pain as well as N/V, was seen here yesterday for the same--states she was discharged home with pain and nausea medications, but states "I have been vomiting every hour and the nausea medications haven't seem to be helping" abd pain mid to R side, states its "a hernia" rates pain 4/10

## 2023-11-20 NOTE — ED Notes (Signed)
Pt ambulated to the BR without assistance. 

## 2023-11-20 NOTE — ED Notes (Signed)
Pt dressed in a gown, belongings (clothes) and purse, given to pts husband, Miranda Aguilar

## 2023-11-20 NOTE — Anesthesia Preprocedure Evaluation (Addendum)
Anesthesia Evaluation  Patient identified by MRN, date of birth, ID band Patient awake    Reviewed: Allergy & Precautions, H&P , NPO status , Patient's Chart, lab work & pertinent test results, reviewed documented beta blocker date and time   History of Anesthesia Complications (+) history of anesthetic complications  Airway Mallampati: II  TM Distance: >3 FB Neck ROM: full    Dental  (+) Caps, Dental Advisory Given   Pulmonary neg pulmonary ROS   Pulmonary exam normal breath sounds clear to auscultation       Cardiovascular Exercise Tolerance: Good hypertension,  Rhythm:regular Rate:Normal     Neuro/Psych   Anxiety     negative neurological ROS  negative psych ROS   GI/Hepatic negative GI ROS, Neg liver ROS,GERD  ,,  Endo/Other  negative endocrine ROS    Renal/GU Renal diseasenegative Renal ROS  negative genitourinary   Musculoskeletal   Abdominal   Peds  Hematology negative hematology ROS (+)   Anesthesia Other Findings   Reproductive/Obstetrics negative OB ROS                             Anesthesia Physical Anesthesia Plan  ASA: 3 and emergent  Anesthesia Plan: General and General ETT   Post-op Pain Management:    Induction:   PONV Risk Score and Plan: Ondansetron  Airway Management Planned:   Additional Equipment:   Intra-op Plan:   Post-operative Plan:   Informed Consent: I have reviewed the patients History and Physical, chart, labs and discussed the procedure including the risks, benefits and alternatives for the proposed anesthesia with the patient or authorized representative who has indicated his/her understanding and acceptance.     Dental Advisory Given  Plan Discussed with: CRNA  Anesthesia Plan Comments:        Anesthesia Quick Evaluation

## 2023-11-20 NOTE — Interval H&P Note (Signed)
History and Physical Interval Note:  11/20/2023 11:17 AM  Miranda Aguilar  has presented today for surgery, with the diagnosis of incisional hernia, small bowel obstruction.  The various methods of treatment have been discussed with the patient and family. After consideration of risks, benefits and other options for treatment, the patient has consented to  Procedure(s): HERNIA REPAIR INCISIONAL (N/A) as a surgical intervention.  The patient's history has been reviewed, patient examined, no change in status, stable for surgery.  I have reviewed the patient's chart and labs.  Questions were answered to the patient's satisfaction.     Franky Macho

## 2023-11-20 NOTE — ED Provider Notes (Signed)
[place] EMERGENCY DEPARTMENT AT [facility] Provider Note   CSN: [id number] Arrival date & time: 11/20/23  7371     History  Chief Complaint  Patient presents with   Emesis    Miranda Aguilar is a 62 y.o. female.  She is here with continued nausea and vomiting upper abdominal pain.  Symptoms started 3 days ago.  She was seen yesterday had an ultrasound and a CAT scan.  Found to have complex umbilical hernia with signs of small bowel obstruction.  Consultation with general surgery felt this could be managed as an outpatient.  She said she has had persistent vomiting every hour since then.  Bile.  No gas or bowel movements.  No fever.  No chest pain or shortness of breath.  Abdominal pain is 4 out of 10 at this moment.  The history is provided by the patient.  Emesis Severity:  Moderate Duration:  2 days Timing:  Intermittent Quality:  Bilious material Progression:  Unchanged Chronicity:  Recurrent Relieved by:  Nothing Ineffective treatments:  Antiemetics Associated symptoms: abdominal pain   Associated symptoms: no cough, no diarrhea and no fever   Risk factors: prior abdominal surgery        Home Medications Prior to Admission medications   Medication Sig Start Date End Date Taking? Authorizing Provider  acetaminophen (TYLENOL) 500 MG tablet Take 1,000 mg by mouth every 6 (six) hours as needed for moderate pain (pain score 4-6).    [provider]  Cholecalciferol (VITAMIN D) 2000 units CAPS Take 2,000 Units by mouth daily.    [provider]  HYDROcodone-acetaminophen (NORCO/VICODIN) 5-325 MG tablet Take 1 tablet by mouth every 6 (six) hours as needed. 11/19/23   Vanetta Mulders, MD  hydrOXYzine (ATARAX) 10 MG tablet Take 1 tablet (10 mg total) by mouth 2 (two) times daily as needed for anxiety. 01/01/22   Allegra Grana, FNP  ibuprofen (ADVIL,MOTRIN) 200 MG tablet Take 400 mg by mouth every 8 (eight) hours as needed for mild pain or  moderate pain.     [provider]  ondansetron (ZOFRAN-ODT) 4 MG disintegrating tablet Take 1 tablet (4 mg total) by mouth every 8 (eight) hours as needed for nausea or vomiting. 11/19/23   Vanetta Mulders, MD      Allergies    Patient has no known allergies.    Review of Systems   Review of Systems  Constitutional:  Negative for fever.  Respiratory:  Negative for cough.   Cardiovascular:  Negative for chest pain.  Gastrointestinal:  Positive for abdominal pain and vomiting. Negative for diarrhea.  Genitourinary:  Negative for dysuria.    Physical Exam Updated Vital Signs BP 131/89   Pulse (!) 106   Temp 98.2 F (36.8 C)   Resp 20   Ht 5\' 4"  (1.626 m)   Wt 113.4 kg   LMP 12/16/2017 (Approximate)   SpO2 97%   BMI 42.91 kg/m  Physical Exam Vitals and nursing note reviewed.  Constitutional:      General: She is not in acute distress.    Appearance: Normal appearance. She is well-developed. She is obese.  HENT:     Head: Normocephalic and atraumatic.  Eyes:     Conjunctiva/sclera: Conjunctivae normal.  Cardiovascular:     Rate and Rhythm: Regular rhythm. Tachycardia present.     Heart sounds: No murmur heard. Pulmonary:     Effort: Pulmonary effort is normal. No respiratory distress.     Breath sounds:  Normal breath sounds.  Abdominal:     Palpations: Abdomen is soft.     Tenderness: There is abdominal tenderness. There is no guarding or rebound.     Hernia: A hernia is present.     Comments: She has some tenderness around her umbilicus and has a reducible hernia there.  There is no overlying skin findings.  Musculoskeletal:        General: No deformity.     Cervical back: Neck supple.  Skin:    General: Skin is warm and dry.     Capillary Refill: Capillary refill takes less than 2 seconds.  Neurological:     General: No focal deficit present.     Mental Status: She is alert.     Sensory: No sensory deficit.     Motor: No weakness.     ED Results  / Procedures / Treatments   Labs (all labs ordered are listed, but only abnormal results are displayed) Labs Reviewed  COMPREHENSIVE METABOLIC PANEL - Abnormal; Notable for the following components:      Result Value   Potassium 3.4 (*)    Glucose, Bld 169 (*)    BUN 27 (*)    Total Bilirubin 1.4 (*)    All other components within normal limits  CBC WITH DIFFERENTIAL/PLATELET - Abnormal; Notable for the following components:   WBC 2.5 (*)    RBC 5.13 (*)    Hemoglobin 16.2 (*)    HCT 47.2 (*)    Neutro Abs 1.6 (*)    Lymphs Abs 0.5 (*)    All other components within normal limits  URINALYSIS, ROUTINE W REFLEX MICROSCOPIC - Abnormal; Notable for the following components:   Color, Urine AMBER (*)    APPearance HAZY (*)    Specific Gravity, Urine 1.031 (*)    Hgb urine dipstick SMALL (*)    Ketones, ur 20 (*)    Protein, ur 100 (*)    All other components within normal limits  LIPASE, BLOOD  LACTIC ACID, PLASMA  BASIC METABOLIC PANEL  MAGNESIUM  PHOSPHORUS  CBC    EKG EKG Interpretation Date/Time:  Thursday November 20 2023 08:09:51 EST Ventricular Rate:  99 PR Interval:  142 QRS Duration:  87 QT Interval:  354 QTC Calculation: 455 R Axis:   -25  Text Interpretation: Sinus rhythm Probable left atrial enlargement Borderline left axis deviation Abnormal R-wave progression, late transition rate increased from prior 5/23 Confirmed by Meridee Score [phone]) on 11/20/2023 8:22:47 AM  Radiology CT ABDOMEN PELVIS W CONTRAST Result Date: 11/19/2023 CLINICAL DATA:  Intermittent right-sided abdominal pain since Monday. EXAM: CT ABDOMEN AND PELVIS WITH CONTRAST TECHNIQUE: Multidetector CT imaging of the abdomen and pelvis was performed using the standard protocol following bolus administration of intravenous contrast. RADIATION DOSE REDUCTION: This exam was performed according to the departmental dose-optimization program which includes automated exposure control, adjustment of  the mA and/or kV according to patient size and/or use of iterative reconstruction technique. CONTRAST:  OMNIPAQUE IOHEXOL 300 MG/ML  SOLN COMPARISON:  Right upper quadrant ultrasound from same day. CT abdomen pelvis dated January 06, 2019. FINDINGS: Lower chest: No acute abnormality. Hepatobiliary: No focal liver abnormality. Chronically contracted gallbladder, unchanged in appearance since 2020. No radiopaque gallstones or wall thickening. New mild central intrahepatic and proximal common bile duct biliary dilatation with pneumobilia. Pancreas: Unremarkable. No pancreatic ductal dilatation or surrounding inflammatory changes. Spleen: Normal in size without focal abnormality. Adrenals/Urinary Tract: Adrenal glands are unremarkable. Horseshoe kidney again noted.  No renal mass, calculi, or hydronephrosis. Small right posterolateral bladder diverticulum again noted. The bladder is otherwise unremarkable. Stomach/Bowel: Distended stomach. Multiple dilated loops of small bowel, with multiple transition points involving a complex umbilical hernia. There is a small amount of ascites surrounding the herniated small bowel loops without wall thickening or pneumatosis. The distal small bowel is decompressed. Majority of the colon is decompressed. Diminutive or absent appendix. Left-sided colonic diverticulosis. Vascular/Lymphatic: Aortic atherosclerosis. No enlarged abdominal or pelvic lymph nodes. Reproductive: Uterus and bilateral adnexa are unremarkable. Other: Additional wide mouth supraumbilical hernia containing nondilated transverse colon. No pneumoperitoneum. Musculoskeletal: No acute or significant osseous findings. IMPRESSION: 1. Small-bowel obstruction secondary to complex umbilical hernia. Small amount of ascites surrounding the herniated small bowel loops without wall thickening or pneumatosis. 2. Additional wide mouth supraumbilical hernia containing non-dilated transverse colon. 3. Chronically contracted  gallbladder, similar in appearance to 2020. No evidence of cholelithiasis. 4. New mild central intrahepatic and proximal common bile duct biliary dilatation with pneumobilia. Correlate with LFTs and history of prior sphincterotomy. 5. Horseshoe kidney. 6.  Aortic Atherosclerosis (ICD10-I70.0). Electronically Signed   By: Obie Dredge M.D.   On: 11/19/2023 12:06   US Abdomen Limited Result Date: 11/19/2023 CLINICAL DATA:  Right upper quadrant abdominal pain for 3 days EXAM: ULTRASOUND ABDOMEN LIMITED RIGHT UPPER QUADRANT COMPARISON:  08/07/2022 FINDINGS: Gallbladder: Gallbladder not well visualized, likely due to collapse configuration. Common bile duct: Diameter: 3 mm Liver: Parenchymal echogenicity: Diffusely increased in echogenicity. Contours: Normal Lesions: None Portal vein: Patent.  Hepatopetal flow Other: None. IMPRESSION: 1. Diffuse increased echogenicity of the hepatic parenchyma is a nonspecific indicator of hepatocellular dysfunction, most commonly steatosis. 2. Unable to evaluate gallbladder, likely due to collapse configuration. It is possible that the gallbladder is filled with stones and sludge. There is continued concern for gallbladder pathology, further evaluation with contrast enhanced CT should be performed. Electronically Signed   By: Acquanetta Belling M.D.   On: 11/19/2023 10:23    Procedures Procedures    Medications Ordered in ED Medications  sodium chloride 0.9 % bolus 1,000 mL (has no administration in time range)  ondansetron (ZOFRAN) injection 4 mg (has no administration in time range)  HYDROmorphone (DILAUDID) injection 1 mg (has no administration in time range)    ED Course/ Medical Decision Making/ A&P Clinical Course as of 11/20/23 2030  Thu Nov 20, 2023  1610 Patient seen and examined by Dr. Lovell Sheehan.  He feels the patient may benefit from evaluation at [facility] where they have surgeons that can deal with her complex hernia.  He asked that I reach out to them to see  if they would accept her in transfer. [MB]  [facility] called back and they said they have no beds at this time.  Reviewed with Dr. Lovell Sheehan.  He asked if we can try [facility]. [MB]  1026 Discussed with Rolly Salter at [facility] transfer line.  She took the information and said she would get back to me with somebody to discuss the case further. [MB]  [facility] called back and said they have no capacity to accept patient.  They also offered to put patient on the wait list if we still wished [MB]  1109 Dr. Lovell Sheehan general surgery is admitting the patient to his service and is taking her down to the preop area now. [MB]    Clinical Course User Index [MB] Terrilee Files, MD  Medical Decision Making Amount and/or Complexity of Data Reviewed Labs: ordered. Radiology: ordered.  Risk Prescription drug management. Decision regarding hospitalization.   This patient complains of generalized abdominal pain nausea vomiting; this involves an extensive number of treatment Options and is a complaint that carries with it a high risk of complications and morbidity. The differential includes obstruction, incarcerated hernia, colitis, dehydration, metabolic derangement  I ordered, reviewed and interpreted labs, which included CBC with low white count stable hemoglobin, chemistries with mildly low potassium elevated glucose, urinalysis without signs of infection, lactate normal I ordered medication IV fluids nausea medication pain medication and reviewed PMP when indicated. I independently    visualized and interpreted imaging which showed bowel obstruction with 2 levels of hernia Previous records obtained and reviewed in epic including recent ED visits I consulted Dr. Lovell Sheehan general surgery and discussed lab and imaging findings and discussed disposition.  Cardiac monitoring reviewed, sinus rhythm Social determinants considered, increase stressors Critical Interventions: None  After  the interventions stated above, I reevaluated the patient and found patient to be hemodynamically stable but still symptomatic Admission and further testing considered, multiple attempts to transfer patient to higher level of care.  They do not have any beds available and unable to accept patient.  Reviewed with Dr. Lovell Sheehan who is going to admit to his service and take to the operating room.         Final Clinical Impression(s) / ED Diagnoses Final diagnoses:  Ventral hernia with obstruction but no gangrene    Rx / DC Orders ED Discharge Orders     None         Terrilee Files, MD 11/20/23 2033

## 2023-11-20 NOTE — Transfer of Care (Signed)
Immediate Anesthesia Transfer of Care Note  Patient: Miranda Aguilar  Procedure(s) Performed: HERNIA REPAIR INCISIONAL (Abdomen)  Patient Location: PACU  Anesthesia Type:General  Level of Consciousness: awake  Airway & Oxygen Therapy: Patient Spontanous Breathing and Patient connected to face mask oxygen  Post-op Assessment: Report given to RN and Post -op Vital signs reviewed and stable  Post vital signs: Reviewed and stable  Last Vitals:  Vitals Value Taken Time  BP 149/80   Temp 98.4   Pulse 91   Resp 20   SpO2 94%     Last Pain:  Vitals:   11/20/23 1139  TempSrc: Oral  PainSc: 3       Patients Stated Pain Goal: 2 (11/20/23 1139)  Complications: No notable events documented.

## 2023-11-20 NOTE — ED Notes (Signed)
Pt taken off of oxygen as pt is maintaining titration of SpO2 down to 2 L well. SpO2 RA 97%

## 2023-11-20 NOTE — ED Notes (Signed)
Asked pt if she had to urinate as she has a urine specimen ordered, pt states she does not feel as if she has to at this time

## 2023-11-21 LAB — CBC
HCT: 39.1 % (ref 36.0–46.0)
Hemoglobin: 13.1 g/dL (ref 12.0–15.0)
MCH: 31.3 pg (ref 26.0–34.0)
MCHC: 33.5 g/dL (ref 30.0–36.0)
MCV: 93.3 fL (ref 80.0–100.0)
Platelets: 187 10*3/uL (ref 150–400)
RBC: 4.19 MIL/uL (ref 3.87–5.11)
RDW: 12.8 % (ref 11.5–15.5)
WBC: 3.6 10*3/uL — ABNORMAL LOW (ref 4.0–10.5)
nRBC: 0 % (ref 0.0–0.2)

## 2023-11-21 LAB — BASIC METABOLIC PANEL
Anion gap: 10 (ref 5–15)
BUN: 25 mg/dL — ABNORMAL HIGH (ref 8–23)
CO2: 27 mmol/L (ref 22–32)
Calcium: 8.2 mg/dL — ABNORMAL LOW (ref 8.9–10.3)
Chloride: 98 mmol/L (ref 98–111)
Creatinine, Ser: 0.66 mg/dL (ref 0.44–1.00)
GFR, Estimated: >60 mL/min (ref >60)
Glucose, Bld: 152 mg/dL — ABNORMAL HIGH (ref 70–99)
Potassium: 3.4 mmol/L — ABNORMAL LOW (ref 3.5–5.1)
Sodium: 135 mmol/L (ref 135–145)

## 2023-11-21 LAB — MAGNESIUM: Magnesium: 1.8 mg/dL (ref 1.7–2.4)

## 2023-11-21 LAB — PHOSPHORUS: Phosphorus: 2.7 mg/dL (ref 2.5–4.6)

## 2023-11-21 MED ORDER — KCL IN DEXTROSE-NACL 40-5-0.45 MEQ/L-%-% IV SOLN: INTRAVENOUS | Status: DC

## 2023-11-21 MED ORDER — KCL IN DEXTROSE-NACL 40-5-0.9 MEQ/L-%-% IV SOLN: INTRAVENOUS | Status: AC

## 2023-11-21 NOTE — Plan of Care (Signed)

## 2023-11-21 NOTE — Progress Notes (Signed)
1 Day Post-Op  Subjective: Patient not complaining of too much incisional pain.  States she is not voiding much.  Objective: Vital signs in last 24 hours: Temp:  [97.9 F (36.6 C)-99.4 F (37.4 C)] 99.1 F (37.3 C) (12/13 0506) Pulse Rate:  [78-106] 87 (12/13 0506) Resp:  [12-28] 20 (12/13 0506) BP: (116-168)/(69-105) 116/71 (12/13 0506) SpO2:  [90 %-100 %] 91 % (12/13 0506) Weight:  [113.4 kg] 113.4 kg (12/12 1139) Last BM Date : 11/18/23  Intake/Output from previous day: 12/12 0701 - 12/13 0700 In: 3688.9 [P.O.:1200; I.V.:2388.9; IV Piggyback:100] Out: 824 [Urine:200; Drains:49; Blood:75] Intake/Output this shift: No intake/output data recorded.  General appearance: alert, cooperative, and no distress Resp: clear to auscultation bilaterally Cardio: regular rate and rhythm, S1, S2 normal, no murmur, click, rub or gallop GI: Soft, occasional bowel sounds appreciated.  Incision healing well.  Lab Results:  Recent Labs    11/20/23 0823 11/21/23 0412  WBC 2.5* 3.6*  HGB 16.2* 13.1  HCT 47.2* 39.1  PLT 216 187   BMET Recent Labs    11/20/23 0823 11/21/23 0412  NA 137 135  K 3.4* 3.4*  CL 98 98  CO2 25 27  GLUCOSE 169* 152*  BUN 27* 25*  CREATININE 0.84 0.66  CALCIUM 9.7 8.2*   PT/INR No results for input(s): "LABPROT", "INR" in the last 72 hours.  Studies/Results: DG Chest Port 1 View Result Date: 11/20/2023 CLINICAL DATA:  hypoxia EXAM: PORTABLE CHEST 1 VIEW COMPARISON:  CXR 01/30/2015 FINDINGS: Low lung volumes. Cardiomegaly. No focal airspace opacity. No radiographically apparent displaced rib fracture. Visualized upper abdomen is unremarkable. No pleural effusion. No pneumothorax. IMPRESSION: 1. Cardiomegaly. If there is clinical concern for a pericardial effusion, further evaluation with echocardiography is recommended. 2.  No focal airspace opacity. Electronically Signed   By: Lorenza Cambridge M.D.   On: 11/20/2023 09:08   CT ABDOMEN PELVIS W  CONTRAST Result Date: 11/19/2023 CLINICAL DATA:  Intermittent right-sided abdominal pain since Monday. EXAM: CT ABDOMEN AND PELVIS WITH CONTRAST TECHNIQUE: Multidetector CT imaging of the abdomen and pelvis was performed using the standard protocol following bolus administration of intravenous contrast. RADIATION DOSE REDUCTION: This exam was performed according to the departmental dose-optimization program which includes automated exposure control, adjustment of the mA and/or kV according to patient size and/or use of iterative reconstruction technique. CONTRAST:  OMNIPAQUE IOHEXOL 300 MG/ML  SOLN COMPARISON:  Right upper quadrant ultrasound from same day. CT abdomen pelvis dated January 06, 2019. FINDINGS: Lower chest: No acute abnormality. Hepatobiliary: No focal liver abnormality. Chronically contracted gallbladder, unchanged in appearance since 2020. No radiopaque gallstones or wall thickening. New mild central intrahepatic and proximal common bile duct biliary dilatation with pneumobilia. Pancreas: Unremarkable. No pancreatic ductal dilatation or surrounding inflammatory changes. Spleen: Normal in size without focal abnormality. Adrenals/Urinary Tract: Adrenal glands are unremarkable. Horseshoe kidney again noted. No renal mass, calculi, or hydronephrosis. Small right posterolateral bladder diverticulum again noted. The bladder is otherwise unremarkable. Stomach/Bowel: Distended stomach. Multiple dilated loops of small bowel, with multiple transition points involving a complex umbilical hernia. There is a small amount of ascites surrounding the herniated small bowel loops without wall thickening or pneumatosis. The distal small bowel is decompressed. Majority of the colon is decompressed. Diminutive or absent appendix. Left-sided colonic diverticulosis. Vascular/Lymphatic: Aortic atherosclerosis. No enlarged abdominal or pelvic lymph nodes. Reproductive: Uterus and bilateral adnexa are unremarkable.  Other: Additional wide mouth supraumbilical hernia containing nondilated transverse colon. No pneumoperitoneum. Musculoskeletal: No acute or  significant osseous findings. IMPRESSION: 1. Small-bowel obstruction secondary to complex umbilical hernia. Small amount of ascites surrounding the herniated small bowel loops without wall thickening or pneumatosis. 2. Additional wide mouth supraumbilical hernia containing non-dilated transverse colon. 3. Chronically contracted gallbladder, similar in appearance to 2020. No evidence of cholelithiasis. 4. New mild central intrahepatic and proximal common bile duct biliary dilatation with pneumobilia. Correlate with LFTs and history of prior sphincterotomy. 5. Horseshoe kidney. 6.  Aortic Atherosclerosis (ICD10-I70.0). Electronically Signed   By: Obie Dredge M.D.   On: 11/19/2023 12:06   US Abdomen Limited Result Date: 11/19/2023 CLINICAL DATA:  Right upper quadrant abdominal pain for 3 days EXAM: ULTRASOUND ABDOMEN LIMITED RIGHT UPPER QUADRANT COMPARISON:  08/07/2022 FINDINGS: Gallbladder: Gallbladder not well visualized, likely due to collapse configuration. Common bile duct: Diameter: 3 mm Liver: Parenchymal echogenicity: Diffusely increased in echogenicity. Contours: Normal Lesions: None Portal vein: Patent.  Hepatopetal flow Other: None. IMPRESSION: 1. Diffuse increased echogenicity of the hepatic parenchyma is a nonspecific indicator of hepatocellular dysfunction, most commonly steatosis. 2. Unable to evaluate gallbladder, likely due to collapse configuration. It is possible that the gallbladder is filled with stones and sludge. There is continued concern for gallbladder pathology, further evaluation with contrast enhanced CT should be performed. Electronically Signed   By: Acquanetta Belling M.D.   On: 11/19/2023 10:23    Anti-infectives: Anti-infectives (From admission, onward)    Start     Dose/Rate Route Frequency Ordered Stop   11/20/23 1145  ceFAZolin (ANCEF)  IVPB 3g/100 mL premix        3 g 200 mL/hr over 30 Minutes Intravenous On call to O.R. 11/20/23 1132 11/20/23 1225   11/20/23 1133  ceFAZolin (ANCEF) 3-0.9 GM/100ML-% IVPB       Note to Pharmacy: Waynard Edwards S: cabinet override      11/20/23 1133 11/20/23 1228       Assessment/Plan: s/p Procedure(s): HERNIA REPAIR INCISIONAL Impression: Stable on postoperative day 1.  Awaiting full return of bowel function, though will advance to full liquid diet.  Will continue IV fluids for another 24 hours given volume depletion from her bowel obstruction.  LOS: 1 day    Franky Macho 11/21/2023

## 2023-11-21 NOTE — Progress Notes (Signed)
   11/21/23 0932  TOC Brief Assessment  Insurance and Status Reviewed  Patient has primary care physician Yes  Home environment has been reviewed Single Family Home with spouse  Prior level of function: Independent  Prior/Current Home Services No current home services  Social Drivers of Health Review SDOH reviewed no interventions necessary  Readmission risk has been reviewed Yes  Transition of care needs no transition of care needs at this time    Transition of Care Department [facility]) has reviewed patient and no TOC needs have been identified at this time. We will continue to monitor patient advancement through interdisciplinary progression rounds. If new patient transition needs arise, please place a TOC consult.

## 2023-11-22 ENCOUNTER — Inpatient Hospital Stay (HOSPITAL_COMMUNITY): Payer: BC Managed Care – PPO

## 2023-11-22 LAB — BASIC METABOLIC PANEL
Anion gap: 9 (ref 5–15)
BUN: 20 mg/dL (ref 8–23)
CO2: 29 mmol/L (ref 22–32)
Calcium: 8.3 mg/dL — ABNORMAL LOW (ref 8.9–10.3)
Chloride: 101 mmol/L (ref 98–111)
Creatinine, Ser: 0.67 mg/dL (ref 0.44–1.00)
GFR, Estimated: >60 mL/min (ref >60)
Glucose, Bld: 197 mg/dL — ABNORMAL HIGH (ref 70–99)
Potassium: 4.3 mmol/L (ref 3.5–5.1)
Sodium: 139 mmol/L (ref 135–145)

## 2023-11-22 LAB — GLUCOSE, CAPILLARY: Glucose-Capillary: 157 mg/dL — ABNORMAL HIGH (ref 70–99)

## 2023-11-22 LAB — CBC
HCT: 38.7 % (ref 36.0–46.0)
Hemoglobin: 12.4 g/dL (ref 12.0–15.0)
MCH: 30.8 pg (ref 26.0–34.0)
MCHC: 32 g/dL (ref 30.0–36.0)
MCV: 96.3 fL (ref 80.0–100.0)
Platelets: 160 10*3/uL (ref 150–400)
RBC: 4.02 MIL/uL (ref 3.87–5.11)
RDW: 12.8 % (ref 11.5–15.5)
WBC: 2.3 10*3/uL — ABNORMAL LOW (ref 4.0–10.5)
nRBC: 0 % (ref 0.0–0.2)

## 2023-11-22 LAB — PHOSPHORUS: Phosphorus: 1.2 mg/dL — ABNORMAL LOW (ref 2.5–4.6)

## 2023-11-22 LAB — MAGNESIUM: Magnesium: 2.2 mg/dL (ref 1.7–2.4)

## 2023-11-22 MED ORDER — METHOCARBAMOL 500 MG PO TABS
500.0000 mg | ORAL_TABLET | Freq: Three times a day (TID) | ORAL | Status: DC
  Administered 2023-11-22 – 2023-11-26 (×12): 500 mg via ORAL
  Filled 2023-11-22 (×12): qty 1

## 2023-11-22 MED ORDER — LACTATED RINGERS IV SOLN: INTRAVENOUS | Status: AC

## 2023-11-22 MED ORDER — BISACODYL 10 MG RE SUPP
10.0000 mg | Freq: Every day | RECTAL | Status: DC
  Administered 2023-11-22 – 2023-11-24 (×3): 10 mg via RECTAL
  Filled 2023-11-22 (×5): qty 1

## 2023-11-22 NOTE — Progress Notes (Signed)
NG tube placed, patient tolerated well. Bedside abdominal xray completed.

## 2023-11-22 NOTE — Progress Notes (Signed)
Mobility Specialist Progress Note:    11/22/23 1052  Mobility  Activity Ambulated with assistance in hallway;Ambulated with assistance to bathroom;Transferred from bed to chair  Level of Assistance Minimal assist, patient does 75% or more  Assistive Device None (Handrails)  Distance Ambulated (ft) 60 ft  Range of Motion/Exercises Active;All extremities  Activity Response Tolerated well  Mobility Referral Yes  Mobility visit 1 Mobility  Mobility Specialist Start Time (ACUTE ONLY) 1015  Mobility Specialist Stop Time (ACUTE ONLY) 1035  Mobility Specialist Time Calculation (min) (ACUTE ONLY) 20 min   Pt received in bed requesting assistance to ambulate. Required MinA to stand and ambulate with no AD. Tolerated well, pt has LOB and unsteadiness but refused RW. MinA and use of handrails to help with LOB. Returned to room, left pt in chair. All needs met.   Lawerance Bach Mobility Specialist Please contact via Special educational needs teacher or  Rehab office at [phone]

## 2023-11-22 NOTE — Progress Notes (Signed)
[facility] Progress Note  2 Days Post-Op  Subjective: Patient seen and examined.  She is resting comfortably in the chair.  Overnight, she had multiple episodes of nausea with vomiting.  For that reason, NG tube was placed overnight.  She had 2 L of bilious output, and states that her nausea and vomiting has completely resolved.  She states that her abdominal pain is stable but controlled with the Dilaudid.  She denies any flatus or bowel movements.  Objective: Vital signs in last 24 hours: Temp:  [98 F (36.7 C)-98.7 F (37.1 C)] 98.4 F (36.9 C) (12/14 0624) Pulse Rate:  [99-109] 99 (12/14 0624) Resp:  [18-24] 18 (12/14 0624) BP: (134-159)/(77-97) 159/77 (12/14 0624) SpO2:  [90 %-95 %] 91 % (12/14 0624) Last BM Date : 11/18/23  Intake/Output from previous day: 12/13 0701 - 12/14 0700 In: 2470.2 [P.O.:720; I.V.:1750.2] Out: 2205 [Emesis/NG output:2200; Drains:5] Intake/Output this shift: No intake/output data recorded.  General appearance: alert, cooperative, and no distress Nose: NG tube in place with bilious output in canister GI: Abdomen soft, nondistended, no percussion tenderness, minimal incisional tenderness to palpation; no rigidity, guarding, rebound tenderness; midline incision C/D/I with honeycomb dressing in place, JP drain in place with minimal bloody output  Lab Results:  Recent Labs    11/21/23 0412 11/22/23 0428  WBC 3.6* 2.3*  HGB 13.1 12.4  HCT 39.1 38.7  PLT 187 160   BMET Recent Labs    11/21/23 0412 11/22/23 0428  NA 135 139  K 3.4* 4.3  CL 98 101  CO2 27 29  GLUCOSE 152* 197*  BUN 25* 20  CREATININE 0.66 0.67  CALCIUM 8.2* 8.3*   PT/INR No results for input(s): "LABPROT", "INR" in the last 72 hours.  Studies/Results: DG Abd Portable 1V Result Date: 11/22/2023 CLINICAL DATA:  Nausea and vomiting EXAM: PORTABLE ABDOMEN - 1 VIEW COMPARISON:  Abdominal x-ray 02/01/2015. CT abdomen and pelvis 11/19/2023. FINDINGS:  Enteric tube tip is in the gastric fundus. There are dilated small bowel loops in the upper abdomen measuring up to 4.4 cm. IMPRESSION: 1. Enteric tube tip is in the gastric fundus. 2. Dilated small bowel loops in the upper abdomen measuring up to 4.4 cm. Electronically Signed   By: Darliss Cheney M.D.   On: 11/22/2023 04:09    Anti-infectives: Anti-infectives (From admission, onward)    Start     Dose/Rate Route Frequency Ordered Stop   11/20/23 1145  ceFAZolin (ANCEF) IVPB 3g/100 mL premix        3 g 200 mL/hr over 30 Minutes Intravenous On call to O.R. 11/20/23 1132 11/20/23 1225   11/20/23 1133  ceFAZolin (ANCEF) 3-0.9 GM/100ML-% IVPB       Note to Pharmacy: Waynard Edwards S: cabinet override      11/20/23 1133 11/20/23 1228       Assessment/Plan:  Patient is a 62 year old female who was admitted status post exploratory laparotomy and incisional hernia repair on 12/12.  -Overnight she had worsening nausea and vomiting, which prompted NG tube placement -KUB demonstrated appropriate placement of NG tube with dilated loops of small bowel -NG to LIS -Will maintain NG tube until patient has return of bowel function -NPO, okay for sips of water and ice chips -Restarted IV fluids at 100 cc/h given NPO status -Continue PRN pain medications and antiemetics -Dulcolax suppository ordered -SCDs and Lovenox   LOS: 2 days    Herberth Deharo A Miley Lindon 11/22/2023

## 2023-11-22 NOTE — Progress Notes (Signed)
   11/22/23 0105  Assess: MEWS Score  Temp 98 F (36.7 C)  BP (!) 153/97  MAP (mmHg) 109  Pulse Rate (!) 109  Resp (!) 24  SpO2 91 %  O2 Device Room Air  Assess: MEWS Score  MEWS Temp 0  MEWS Systolic 0  MEWS Pulse 1  MEWS RR 1  MEWS LOC 0  MEWS Score 2  MEWS Score Color Yellow  Assess: if the MEWS score is Yellow or Red  Were vital signs accurate and taken at a resting state? Yes  Does the patient meet 2 or more of the SIRS criteria? No  MEWS guidelines implemented  Yes, yellow  Treat  MEWS Interventions Considered administering scheduled or prn medications/treatments as ordered  Take Vital Signs  Increase Vital Sign Frequency  Yellow: Q2hr x1, continue Q4hrs until patient remains green for 12hrs  Escalate  MEWS: Escalate Yellow: Discuss with charge nurse and consider notifying provider and/or RRT  Notify: Charge Nurse/RN  Name of Charge Nurse/RN Notified Rosalyn Charters  Provider Notification  Provider Name/Title Dr, Robyne Peers  Date Provider Notified 11/22/23  Method of Notification Page  Notification Reason Change in status;Other (Comment) (yellow mews)  Provider response See new orders  Assess: SIRS CRITERIA  SIRS Temperature  0  SIRS Respirations  1  SIRS Pulse 1  SIRS WBC 0  SIRS Score Sum  2   Patient presenting with dark green vomiting, unrelieved by antemetic, Dr. Robyne Peers notified, see new orders.

## 2023-11-23 MED ORDER — LACTATED RINGERS IV SOLN: INTRAVENOUS | Status: DC

## 2023-11-23 NOTE — Anesthesia Postprocedure Evaluation (Signed)
Anesthesia Post Note  Patient: Miranda Aguilar  Procedure(s) Performed: HERNIA REPAIR INCISIONAL (Abdomen)  Patient location during evaluation: Phase II Anesthesia Type: General Level of consciousness: awake Pain management: pain level controlled Vital Signs Assessment: post-procedure vital signs reviewed and stable Respiratory status: spontaneous breathing and respiratory function stable Cardiovascular status: blood pressure returned to baseline and stable Postop Assessment: no headache and no apparent nausea or vomiting Anesthetic complications: no Comments: Late entry   No notable events documented.   Last Vitals:  Vitals:   11/22/23 1956 11/23/23 0402  BP: (!) 141/76 138/79  Pulse: 94 80  Resp: 18 16  Temp: 36.9 C 36.9 C  SpO2: 91% 93%    Last Pain:  Vitals:   11/23/23 0900  TempSrc:   PainSc: 4                  Windell Norfolk

## 2023-11-23 NOTE — Plan of Care (Signed)
Pt is alert and oriented x 4. NG in and to Intermittent suction. Green drainage bile noted. Pt has received Dilaudid x 2 doses, 1 dose of oxy and 1 dose of toradol. Pt reported she feel asleep but then woke up in severe pain and nausea Dilaudid and zofran was given.  Problem: Nutrition: Goal: Adequate nutrition will be maintained Outcome: Not Progressing   Problem: Education: Goal: Knowledge of General Education information will improve Description: Including pain rating scale, medication(s)/side effects and non-pharmacologic comfort measures Outcome: Progressing   Problem: Health Behavior/Discharge Planning: Goal: Ability to manage health-related needs will improve Outcome: Progressing   Problem: Clinical Measurements: Goal: Ability to maintain clinical measurements within normal limits will improve Outcome: Progressing Goal: Will remain free from infection Outcome: Progressing Goal: Diagnostic test results will improve Outcome: Progressing Goal: Respiratory complications will improve Outcome: Progressing Goal: Cardiovascular complication will be avoided Outcome: Progressing   Problem: Activity: Goal: Risk for activity intolerance will decrease Outcome: Progressing   Problem: Coping: Goal: Level of anxiety will decrease Outcome: Progressing   Problem: Elimination: Goal: Will not experience complications related to bowel motility Outcome: Progressing Goal: Will not experience complications related to urinary retention Outcome: Progressing   Problem: Pain Management: Goal: General experience of comfort will improve Outcome: Progressing   Problem: Safety: Goal: Ability to remain free from injury will improve Outcome: Progressing   Problem: Skin Integrity: Goal: Risk for impaired skin integrity will decrease Outcome: Progressing

## 2023-11-23 NOTE — Progress Notes (Signed)
[facility] Progress Note  3 Days Post-Op  Subjective: Patient seen and examined.  She is resting comfortably in the chair.  She did have some increased pain this morning, as she delayed receiving a dose of Dilaudid.  She denies nausea and vomiting.  She had a small bowel movement after suppository yesterday, but states that she has had no flatus.  Her NG tube remains in place with 2 L of bilious output out.  JP drain remains in place with 8 cc of sanguinous looking output.  Objective: Vital signs in last 24 hours: Temp:  [98.4 F (36.9 C)] 98.4 F (36.9 C) (12/15 0402) Pulse Rate:  [80-94] 80 (12/15 0402) Resp:  [16-18] 16 (12/15 0402) BP: (138-141)/(76-79) 138/79 (12/15 0402) SpO2:  [91 %-93 %] 93 % (12/15 0402) Last BM Date : 11/18/23  Intake/Output from previous day: 12/14 0701 - 12/15 0700 In: 307.9 [I.V.:307.9] Out: 2058 [Emesis/NG output:2050; Drains:8] Intake/Output this shift: Total I/O In: 80 [P.O.:80] Out: -   General appearance: alert, cooperative, and no distress Nose: NG tube in place with bilious output in canister GI: Abdomen soft, nondistended, no percussion tenderness, nontender palpation; no rigidity, guarding, rebound tenderness; midline incision C/D/I with honeycomb dressing in place, JP drain with minimal sanguinous output  Lab Results:  Recent Labs    11/21/23 0412 11/22/23 0428  WBC 3.6* 2.3*  HGB 13.1 12.4  HCT 39.1 38.7  PLT 187 160   BMET Recent Labs    11/21/23 0412 11/22/23 0428  NA 135 139  K 3.4* 4.3  CL 98 101  CO2 27 29  GLUCOSE 152* 197*  BUN 25* 20  CREATININE 0.66 0.67  CALCIUM 8.2* 8.3*   PT/INR No results for input(s): "LABPROT", "INR" in the last 72 hours.  Studies/Results: DG Abd Portable 1V Result Date: 11/22/2023 CLINICAL DATA:  Nausea and vomiting EXAM: PORTABLE ABDOMEN - 1 VIEW COMPARISON:  Abdominal x-ray 02/01/2015. CT abdomen and pelvis 11/19/2023. FINDINGS: Enteric tube tip is in the gastric  fundus. There are dilated small bowel loops in the upper abdomen measuring up to 4.4 cm. IMPRESSION: 1. Enteric tube tip is in the gastric fundus. 2. Dilated small bowel loops in the upper abdomen measuring up to 4.4 cm. Electronically Signed   By: Darliss Cheney M.D.   On: 11/22/2023 04:09    Anti-infectives: Anti-infectives (From admission, onward)    Start     Dose/Rate Route Frequency Ordered Stop   11/20/23 1145  ceFAZolin (ANCEF) IVPB 3g/100 mL premix        3 g 200 mL/hr over 30 Minutes Intravenous On call to O.R. 11/20/23 1132 11/20/23 1225   11/20/23 1133  ceFAZolin (ANCEF) 3-0.9 GM/100ML-% IVPB       Note to Pharmacy: Waynard Edwards S: cabinet override      11/20/23 1133 11/20/23 1228       Assessment/Plan:  Patient is a 62 year old female who was admitted status post exploratory laparotomy and incisional hernia repair on 12/12.   -Patient with no flatus and only a small amount of stool output -Maintain NG to LIS -Will maintain NG tube until patient has return of bowel function -NPO, okay for sips of water and ice chips -Continue IV fluids while NPO -Continue PRN pain medications and antiemetics -Dulcolax suppository  -SCDs and Lovenox    LOS: 3 days    Jaymond Waage A Kirsty Monjaraz 11/23/2023

## 2023-11-24 LAB — BASIC METABOLIC PANEL
Anion gap: 14 (ref 5–15)
BUN: 29 mg/dL — ABNORMAL HIGH (ref 8–23)
CO2: 31 mmol/L (ref 22–32)
Calcium: 8.7 mg/dL — ABNORMAL LOW (ref 8.9–10.3)
Chloride: 97 mmol/L — ABNORMAL LOW (ref 98–111)
Creatinine, Ser: 0.77 mg/dL (ref 0.44–1.00)
GFR, Estimated: >60 mL/min (ref >60)
Glucose, Bld: 108 mg/dL — ABNORMAL HIGH (ref 70–99)
Potassium: 3 mmol/L — ABNORMAL LOW (ref 3.5–5.1)
Sodium: 142 mmol/L (ref 135–145)

## 2023-11-24 LAB — PHOSPHORUS: Phosphorus: 2.9 mg/dL (ref 2.5–4.6)

## 2023-11-24 LAB — MAGNESIUM: Magnesium: 2.1 mg/dL (ref 1.7–2.4)

## 2023-11-24 MED ORDER — PROMETHAZINE (PHENERGAN) 6.25MG IN NS 50ML IVPB
6.2500 mg | Freq: Four times a day (QID) | INTRAVENOUS | Status: DC | PRN
  Filled 2023-11-24: qty 50

## 2023-11-24 MED ORDER — LACTATED RINGERS IV SOLN: INTRAVENOUS | Status: DC

## 2023-11-24 MED ORDER — POTASSIUM CHLORIDE 10 MEQ/100ML IV SOLN
10.0000 meq | INTRAVENOUS | Status: AC
  Administered 2023-11-24 (×6): 10 meq via INTRAVENOUS
  Filled 2023-11-24 (×2): qty 100

## 2023-11-24 MED ORDER — PROMETHAZINE (PHENERGAN) 6.25MG IN NS 50ML IVPB
6.2500 mg | Freq: Four times a day (QID) | INTRAVENOUS | Status: DC | PRN
  Administered 2023-11-24: 6.25 mg via INTRAVENOUS
  Filled 2023-11-24: qty 50

## 2023-11-24 NOTE — Consult Note (Signed)
Pharmacy Consult Note - Electrolytes  Sodium (mmol/L)  Date Value  11/24/2023 142  01/30/2015 136 (A)   Potassium (mmol/L)  Date Value  11/24/2023 3.0 (L)   Magnesium (mg/dL)  Date Value  16/09/9603 2.1   Calcium (mg/dL)  Date Value  54/08/8118 8.7 (L)   Albumin (g/dL)  Date Value  14/78/2956 4.4   Phosphorus (mg/dL)  Date Value  21/30/8657 2.9    ASSESSMENT: 62 y.o. female with PMH including abdominal hernia, ileus who presents with recurrent incisional hernia. Pharmacy has been consulted to monitor and replace electrolytes. She had her hernia procedure on 12/12 and since has had no flatus. She is NPO until return of bowel function. Patient's renal function is currently stable.  Lab Results  Component Value Date   CREATININE 0.77 11/24/2023   CREATININE 0.67 11/22/2023   CREATININE 0.66 11/21/2023    mIVF: LR @ 100 mL/hr  Pertinent medications: N/A  Goal of Therapy:  [x]  Electrolytes WNL []  K >=4.0 and Mg >=2.0  PLAN: K 3.0 >> potassium chloride 10 mEq IV x 6 No other electrolyte replacement currently warranted Check electrolytes with next AM labs   Thank you for allowing pharmacy to be a part of this patient's care.  Will M. Dareen Piano, PharmD Clinical Pharmacist 11/24/2023 10:57 AM

## 2023-11-24 NOTE — Progress Notes (Signed)
NGT output has decreased.  Will remove NGT.

## 2023-11-24 NOTE — Plan of Care (Signed)

## 2023-11-24 NOTE — Progress Notes (Signed)
[facility] Progress Note  4 Days Post-Op  Subjective: Patient was sleeping in bed upon entering room. She was woken up easily, but confused throughout interview and says "I wish you were here. You're not really here right? I'm seeing you. I thought you went hiking." "You are gone for the month?" The patient has midline abdominal pain, but it is controlled with pain medications. The patient has not passed flatus and says she feels nauseated after sipping small amounts of water. Her NG tube remains in place with 200 cc's of bilious output out. JP drain remains in place with scant cc of sanguinous looking output. She says that her husband visits her during the day.  Objective: Vital signs in last 24 hours: Temp:  [98 F (36.7 C)-98.6 F (37 C)] 98.6 F (37 C) (12/16 0340) Pulse Rate:  [84-91] 84 (12/16 0340) Resp:  [16-20] 20 (12/16 0340) BP: (140-148)/(79-88) 142/82 (12/16 0340) SpO2:  [91 %-93 %] 91 % (12/16 0340) Last BM Date : 11/18/23  Intake/Output from previous day: 12/15 0701 - 12/16 0700 In: 1810 [P.O.:80; I.V.:1680; NG/GT:50] Out: 3005 [Emesis/NG output:3000; Drains:5] Intake/Output this shift: No intake/output data recorded.  General appearance: alert, cooperative, and no distress Nose: NG tube in place with bilious output in canister GI: Abdomen soft, nondistended, no percussion tenderness, nontender palpation; no rigidity, guarding, rebound tenderness; midline incision C/D/I with honeycomb dressing in place, JP drain with minimal sanguinous output Neuro: Patient is oriented to person, birthday, place, month/year, but her concentration waxes and wanes. Not tremulous.  Lab Results:  Recent Labs    11/22/23 0428  WBC 2.3*  HGB 12.4  HCT 38.7  PLT 160   BMET Recent Labs    11/22/23 0428  NA 139  K 4.3  CL 101  CO2 29  GLUCOSE 197*  BUN 20  CREATININE 0.67  CALCIUM 8.3*   PT/INR No results for input(s): "LABPROT", "INR" in the last 72  hours.  Studies/Results: No results found.   Anti-infectives: Anti-infectives (From admission, onward)    Start     Dose/Rate Route Frequency Ordered Stop   11/20/23 1145  ceFAZolin (ANCEF) IVPB 3g/100 mL premix        3 g 200 mL/hr over 30 Minutes Intravenous On call to O.R. 11/20/23 1132 11/20/23 1225   11/20/23 1133  ceFAZolin (ANCEF) 3-0.9 GM/100ML-% IVPB       Note to Pharmacy: Waynard Edwards S: cabinet override      11/20/23 1133 11/20/23 1228       Assessment/Plan:  Patient is a 62 year old female who was admitted status post exploratory laparotomy and incisional hernia repair on 12/12. She could be delirious given her intermittent confused comments, as I don't suspect this is from being woken up. She is alert and oriented fully, but it takes her a while to report answers. Unfortunately, her line/tubing burden can not be alleviated until she passes stool. I encouraged walking.   -Patient with no flatus or stool passage over past day -Maintain NG to LIS -Will maintain NG tube until patient has return of bowel function -NPO, okay for sips of water and ice chips -Continue IV fluids while NPO -Continue PRN pain medications and antiemetics -Daily Dulcolax suppository  -SCDs and Lovenox    LOS: 4 days    Kayleen Memos 11/24/2023

## 2023-11-24 NOTE — Progress Notes (Signed)
Patient NG tube removed. She is tolerating a clear liquid diet without difficulty , PRN zofran and oxycodone given for nausea and pain yielding helpful results. Patient up in chair at this time. JP drain had scant amount of drainage. Patient verbalizes feeling much better than earlier in shift.

## 2023-11-25 LAB — BASIC METABOLIC PANEL
Anion gap: 9 (ref 5–15)
BUN: 27 mg/dL — ABNORMAL HIGH (ref 8–23)
CO2: 31 mmol/L (ref 22–32)
Calcium: 8.2 mg/dL — ABNORMAL LOW (ref 8.9–10.3)
Chloride: 101 mmol/L (ref 98–111)
Creatinine, Ser: 0.69 mg/dL (ref 0.44–1.00)
GFR, Estimated: >60 mL/min (ref >60)
Glucose, Bld: 117 mg/dL — ABNORMAL HIGH (ref 70–99)
Potassium: 3.3 mmol/L — ABNORMAL LOW (ref 3.5–5.1)
Sodium: 141 mmol/L (ref 135–145)

## 2023-11-25 LAB — MAGNESIUM: Magnesium: 2 mg/dL (ref 1.7–2.4)

## 2023-11-25 LAB — CBC
HCT: 35.3 % — ABNORMAL LOW (ref 36.0–46.0)
Hemoglobin: 11.5 g/dL — ABNORMAL LOW (ref 12.0–15.0)
MCH: 31 pg (ref 26.0–34.0)
MCHC: 32.6 g/dL (ref 30.0–36.0)
MCV: 95.1 fL (ref 80.0–100.0)
Platelets: 224 10*3/uL (ref 150–400)
RBC: 3.71 MIL/uL — ABNORMAL LOW (ref 3.87–5.11)
RDW: 12.8 % (ref 11.5–15.5)
WBC: 6.5 10*3/uL (ref 4.0–10.5)
nRBC: 0 % (ref 0.0–0.2)

## 2023-11-25 LAB — PHOSPHORUS: Phosphorus: 2.5 mg/dL (ref 2.5–4.6)

## 2023-11-25 NOTE — Plan of Care (Signed)
Rested well during the night. Up to the recliner chair during the night. Pain controlled. Problem: Education: Goal: Knowledge of General Education information will improve Description: Including pain rating scale, medication(s)/side effects and non-pharmacologic comfort measures Outcome: Progressing   Problem: Health Behavior/Discharge Planning: Goal: Ability to manage health-related needs will improve Outcome: Progressing   Problem: Clinical Measurements: Goal: Ability to maintain clinical measurements within normal limits will improve Outcome: Progressing Goal: Will remain free from infection Outcome: Progressing Goal: Diagnostic test results will improve Outcome: Progressing Goal: Respiratory complications will improve Outcome: Progressing Goal: Cardiovascular complication will be avoided Outcome: Progressing   Problem: Activity: Goal: Risk for activity intolerance will decrease Outcome: Progressing   Problem: Nutrition: Goal: Adequate nutrition will be maintained Outcome: Progressing   Problem: Coping: Goal: Level of anxiety will decrease Outcome: Progressing   Problem: Elimination: Goal: Will not experience complications related to bowel motility Outcome: Progressing Goal: Will not experience complications related to urinary retention Outcome: Progressing   Problem: Pain Management: Goal: General experience of comfort will improve Outcome: Progressing   Problem: Safety: Goal: Ability to remain free from injury will improve Outcome: Progressing   Problem: Skin Integrity: Goal: Risk for impaired skin integrity will decrease Outcome: Progressing

## 2023-11-25 NOTE — Progress Notes (Signed)
5 Days Post-Op  Subjective: Patient has had multiple bowel movements overnight.  She is passing flatus.  No emesis noted.  Objective: Vital signs in last 24 hours: Temp:  [98.2 F (36.8 C)-99.2 F (37.3 C)] 99.2 F (37.3 C) (12/17 0508) Pulse Rate:  [73-83] 73 (12/17 0508) Resp:  [18] 18 (12/17 0508) BP: (147-151)/(79-84) 151/79 (12/17 0508) SpO2:  [95 %-97 %] 97 % (12/17 0508) Last BM Date : 11/25/23  Intake/Output from previous day: 12/16 0701 - 12/17 0700 In: 877.6 [P.O.:50; I.V.:761.4; IV Piggyback:66.2] Out: 510 [Emesis/NG output:500; Drains:10] Intake/Output this shift: Total I/O In: 240 [P.O.:240] Out: -   General appearance: alert, cooperative, and no distress Resp: clear to auscultation bilaterally Cardio: regular rate and rhythm, S1, S2 normal, no murmur, click, rub or gallop GI: Soft, active bowel sounds appreciated.  Midline incision healing well.  JP drainage minimal.  Lab Results:  Recent Labs    11/25/23 0410  WBC 6.5  HGB 11.5*  HCT 35.3*  PLT 224   BMET Recent Labs    11/24/23 0943 11/25/23 0410  NA 142 141  K 3.0* 3.3*  CL 97* 101  CO2 31 31  GLUCOSE 108* 117*  BUN 29* 27*  CREATININE 0.77 0.69  CALCIUM 8.7* 8.2*   PT/INR No results for input(s): "LABPROT", "INR" in the last 72 hours.  Studies/Results: No results found.  Anti-infectives: Anti-infectives (From admission, onward)    Start     Dose/Rate Route Frequency Ordered Stop   11/20/23 1145  ceFAZolin (ANCEF) IVPB 3g/100 mL premix        3 g 200 mL/hr over 30 Minutes Intravenous On call to O.R. 11/20/23 1132 11/20/23 1225   11/20/23 1133  ceFAZolin (ANCEF) 3-0.9 GM/100ML-% IVPB       Note to Pharmacy: Waynard Edwards S: cabinet override      11/20/23 1133 11/20/23 1228       Assessment/Plan: s/p Procedure(s): HERNIA REPAIR INCISIONAL Impression: Postoperative day 5.  Patient's bowel function has returned.  Will advance to regular diet.  Hypokalemia has been addressed.   Anticipate discharge in next 24 to 48 hours.  LOS: 5 days    Franky Macho 11/25/2023

## 2023-11-25 NOTE — Plan of Care (Signed)

## 2023-11-26 MED ORDER — OXYCODONE HCL 5 MG PO TABS: 5.0000 mg | ORAL_TABLET | Freq: Four times a day (QID) | ORAL | 0 refills | Status: DC | PRN

## 2023-11-26 NOTE — Plan of Care (Signed)
  Problem: Education: Goal: Knowledge of General Education information will improve Description: Including pain rating scale, medication(s)/side effects and non-pharmacologic comfort measures Outcome: Progressing   Problem: Clinical Measurements: Goal: Ability to maintain clinical measurements within normal limits will improve Outcome: Progressing   Problem: Activity: Goal: Risk for activity intolerance will decrease Outcome: Progressing   Problem: Nutrition: Goal: Adequate nutrition will be maintained Outcome: Progressing   Problem: Elimination: Goal: Will not experience complications related to bowel motility Outcome: Progressing Goal: Will not experience complications related to urinary retention Outcome: Progressing   Problem: Pain Management: Goal: General experience of comfort will improve Outcome: Progressing   Problem: Safety: Goal: Ability to remain free from injury will improve Outcome: Progressing   Problem: Skin Integrity: Goal: Risk for impaired skin integrity will decrease Outcome: Progressing

## 2023-11-26 NOTE — Discharge Summary (Signed)
Physician Discharge Summary  Patient ID: Miranda Aguilar MRN: [id number] DOB/AGE: August 27, 1961 62 y.o.  Admit date: 11/20/2023 Discharge date: 11/26/2023  Admission Diagnoses: Partial small bowel obstruction, recurrent incisional hernia  Discharge Diagnoses:  Principal Problem:   Recurrent incisional hernia Active Problems:   Recurrent incisional hernia with obstruction   Discharged Condition: good  Hospital Course: Patient is a 62 year old white female who presented to [facility] with recurrent nausea and vomiting secondary to a partial small bowel obstruction from a complex incisional hernia.  She was taken to the operating room on 11/20/2023 and underwent of recurrent incisional herniorrhaphy with mesh.  She tolerated the surgery well.  Her postoperative course was remarkable for a delay in return of bowel function.  Her diet was advanced once her bowel function returned.  Her drain in the subcutaneous tissue had low output with serosanguineous drainage and thus was removed on 11/26/2023.  The patient is being discharged home on 11/26/2023 in good and improving condition.  Treatments: surgery: Recurrent incisional herniorrhaphy with mesh on 11/20/2023  Discharge Exam: Blood pressure (!) 145/90, pulse 61, temperature 97.7 F (36.5 C), temperature source Oral, resp. rate 18, height 5\' 4"  (1.626 m), weight 113.4 kg, last menstrual period 12/16/2017, SpO2 97%. General appearance: alert, cooperative, and no distress Resp: clear to auscultation bilaterally Cardio: regular rate and rhythm, S1, S2 normal, no murmur, click, rub or gallop GI: Soft, incision healing well.  Staples intact.  Bowel sounds active.  Disposition: Discharge disposition: 01-Home or Self Care       Discharge Instructions     Diet - low sodium heart healthy   Complete by: As directed    Increase activity slowly   Complete by: As directed       Allergies as of 11/26/2023   No Known Allergies       Medication List     STOP taking these medications    HYDROcodone-acetaminophen 5-325 MG tablet Commonly known as: NORCO/VICODIN       TAKE these medications    acetaminophen 500 MG tablet Commonly known as: TYLENOL Take 1,000 mg by mouth every 6 (six) hours as needed for moderate pain (pain score 4-6).   hydrOXYzine 10 MG tablet Commonly known as: ATARAX Take 1 tablet (10 mg total) by mouth 2 (two) times daily as needed for anxiety.   ibuprofen 200 MG tablet Commonly known as: ADVIL Take 400 mg by mouth every 8 (eight) hours as needed for mild pain or moderate pain.   ondansetron 4 MG disintegrating tablet Commonly known as: ZOFRAN-ODT Take 1 tablet (4 mg total) by mouth every 8 (eight) hours as needed for nausea or vomiting.   oxyCODONE 5 MG immediate release tablet Commonly known as: Roxicodone Take 1 tablet (5 mg total) by mouth every 6 (six) hours as needed.   Vitamin D 50 MCG (2000 UT) Caps Take 2,000 Units by mouth daily.        Follow-up Information     Franky Macho, MD. Schedule an appointment as soon as possible for a visit on 12/02/2023.   Specialty: General Surgery Contact information: [address] [place] [address] [phone]                 Signed: Franky Macho 11/26/2023, 9:09 AM

## 2023-11-28 ENCOUNTER — Encounter (HOSPITAL_COMMUNITY): Payer: Self-pay | Admitting: General Surgery

## 2023-12-02 ENCOUNTER — Encounter: Payer: Self-pay | Admitting: General Surgery

## 2023-12-02 ENCOUNTER — Ambulatory Visit: Payer: BC Managed Care – PPO | Admitting: General Surgery

## 2023-12-02 VITALS — BP 155/95 | HR 72 | Temp 97.8°F | Resp 14 | Ht 64.0 in | Wt 254.0 lb

## 2023-12-02 DIAGNOSIS — Z8719 Personal history of other diseases of the digestive system: Secondary | ICD-10-CM | POA: Diagnosis not present

## 2023-12-02 DIAGNOSIS — Z9889 Other specified postprocedural states: Secondary | ICD-10-CM | POA: Diagnosis not present

## 2023-12-02 NOTE — Progress Notes (Signed)
Subjective:     Miranda Aguilar  Patient here for postoperative visit, status post open incisional herniorrhaphy with mesh.  She is doing very well.  Her energy level is improving.  She denies any fever or chills.  She is pleased with the results. Objective:    BP (!) 155/95   Pulse 72   Temp 97.8 F (36.6 C) (Oral)   Resp 14   Ht 5\' 4"  (1.626 m)   Wt 254 lb (115.2 kg)   LMP 12/16/2017 (Approximate)   SpO2 98%   BMI 43.60 kg/m   General:  alert, cooperative, and no distress  Abdomen soft, incision healing well.  Staples removed.     Assessment:    Doing well postoperatively.    Plan:   May gradually increase activity.  Follow-up here as needed.

## 2023-12-10 DIAGNOSIS — R7309 Other abnormal glucose: Secondary | ICD-10-CM | POA: Diagnosis not present

## 2024-01-10 DIAGNOSIS — R7309 Other abnormal glucose: Secondary | ICD-10-CM | POA: Diagnosis not present

## 2024-02-07 DIAGNOSIS — R7309 Other abnormal glucose: Secondary | ICD-10-CM | POA: Diagnosis not present

## 2024-02-11 ENCOUNTER — Encounter: Payer: Self-pay | Admitting: Family

## 2024-02-11 ENCOUNTER — Ambulatory Visit: Admitting: Family

## 2024-02-11 VITALS — BP 138/86 | HR 90 | Temp 99.1°F | Ht 64.0 in | Wt 262.4 lb

## 2024-02-11 DIAGNOSIS — M545 Low back pain, unspecified: Secondary | ICD-10-CM | POA: Diagnosis not present

## 2024-02-11 DIAGNOSIS — G8929 Other chronic pain: Secondary | ICD-10-CM | POA: Diagnosis not present

## 2024-02-11 MED ORDER — CYCLOBENZAPRINE HCL 5 MG PO TABS: 5.0000 mg | ORAL_TABLET | Freq: Every evening | ORAL | 1 refills | Status: AC | PRN

## 2024-02-11 MED ORDER — PREDNISONE 10 MG PO TABS: ORAL_TABLET | ORAL | 0 refills | Status: DC

## 2024-02-11 NOTE — Progress Notes (Unsigned)
   Assessment & Plan:  There are no diagnoses linked to this encounter.   Return precautions given.   Risks, benefits, and alternatives of the medications and treatment plan prescribed today were discussed, and patient expressed understanding.   Education regarding symptom management and diagnosis given to patient on AVS either electronically or printed.  No follow-ups on file.  Rennie Plowman, FNP  Subjective:    Patient ID: Miranda Aguilar, female    DOB: 06-21-1961, 63 y.o.   MRN: [id number]  CC: Miranda Aguilar is a 63 y.o. female who presents today for an acute visit.    HPI: Right low back worse the past 2 weeks Pain radiates to right buttucks She has exquite  pain which is sharp, burning.  She couldn't find a comfortable position while sleeping last night  No pain when standing, bending. Pain with walking. No leg pain, calf pain, saddle anesthesia, rash.   She is taking 400mg  ibuprofen q6hrs without significant relief.   She has h/o low back pain  She has been sitting more after surgery in decemeber and more work and sitting  Years ago she had low back pain and she did PT with improvement. She has  been treated with cortizone injeciton, prednisone with relief.     Ho acute renal failure H/o HTN, GERD, BCC Status post incisional herniorrhaphy with mesh  11/20/23 d/t ventral hernia  Allergies: Patient has no known allergies. Current Outpatient Medications on File Prior to Visit  Medication Sig Dispense Refill   Cholecalciferol (VITAMIN D) 2000 units CAPS Take 2,000 Units by mouth daily.     hydrOXYzine (ATARAX) 10 MG tablet Take 1 tablet (10 mg total) by mouth 2 (two) times daily as needed for anxiety. 90 tablet 1   ibuprofen (ADVIL,MOTRIN) 200 MG tablet Take 400 mg by mouth every 8 (eight) hours as needed for mild pain or moderate pain.      acetaminophen (TYLENOL) 500 MG tablet Take 1,000 mg by mouth every 6 (six) hours as needed for moderate pain (pain score  4-6). (Patient not taking: Reported on 02/11/2024)     No current facility-administered medications on file prior to visit.    Review of Systems    Objective:    BP 138/86   Pulse 90   Temp 99.1 F (37.3 C) (Oral)   Ht 5\' 4"  (1.626 m)   Wt 262 lb 6.4 oz (119 kg)   LMP 12/16/2017 (Approximate)   SpO2 97%   BMI 45.04 kg/m   BP Readings from Last 3 Encounters:  02/11/24 138/86  12/02/23 (!) 155/95  11/26/23 (!) 145/90   Wt Readings from Last 3 Encounters:  02/11/24 262 lb 6.4 oz (119 kg)  12/02/23 254 lb (115.2 kg)  11/20/23 250 lb (113.4 kg)    Physical Exam

## 2024-02-11 NOTE — Patient Instructions (Signed)
 Start prednisone tomorrow Flexeril, muscle relaxant, as needed  Do not drive or operate heavy machinery while on muscle relaxant. Please do not drink alcohol. Only take this medication as needed for acute muscle spasm at bedtime. This medication make you feel drowsy so be very careful.  Stop taking if become too drowsy or somnolent as this puts you at risk for falls. Please contact our office with any questions.    Low Back Sprain or Strain Rehab Ask your health care provider which exercises are safe for you. Do exercises exactly as told by your health care provider and adjust them as directed. It is normal to feel mild stretching, pulling, tightness, or discomfort as you do these exercises. Stop right away if you feel sudden pain or your pain gets worse. Do not begin these exercises until told by your health care provider. Stretching and range-of-motion exercises These exercises warm up your muscles and joints and improve the movement and flexibility of your back. These exercises also help to relieve pain, numbness, and tingling. Lumbar rotation  Lie on your back on a firm bed or the floor with your knees bent. Straighten your arms out to your sides so each arm forms a 90-degree angle (right angle) with a side of your body. Slowly move (rotate) both of your knees to one side of your body until you feel a stretch in your lower back (lumbar). Try not to let your shoulders lift off the floor. Hold this position for __________ seconds. Tense your abdominal muscles and slowly move your knees back to the starting position. Repeat this exercise on the other side of your body. Repeat __________ times. Complete this exercise __________ times a day. Single knee to chest  Lie on your back on a firm bed or the floor with both legs straight. Bend one of your knees. Use your hands to move your knee up toward your chest until you feel a gentle stretch in your lower back and buttock. Hold your leg in this  position by holding on to the front of your knee. Keep your other leg as straight as possible. Hold this position for __________ seconds. Slowly return to the starting position. Repeat with your other leg. Repeat __________ times. Complete this exercise __________ times a day. Prone extension on elbows  Lie on your abdomen on a firm bed or the floor (prone position). Prop yourself up on your elbows. Use your arms to help lift your chest up until you feel a gentle stretch in your abdomen and your lower back. This will place some of your body weight on your elbows. If this is uncomfortable, try stacking pillows under your chest. Your hips should stay down, against the surface that you are lying on. Keep your hip and back muscles relaxed. Hold this position for __________ seconds. Slowly relax your upper body and return to the starting position. Repeat __________ times. Complete this exercise __________ times a day. Strengthening exercises These exercises build strength and endurance in your back. Endurance is the ability to use your muscles for a long time, even after they get tired. Pelvic tilt This exercise strengthens the muscles that lie deep in the abdomen. Lie on your back on a firm bed or the floor with your legs extended. Bend your knees so they are pointing toward the ceiling and your feet are flat on the floor. Tighten your lower abdominal muscles to press your lower back against the floor. This motion will tilt your pelvis so your tailbone  points up toward the ceiling instead of pointing to your feet or the floor. To help with this exercise, you may place a small towel under your lower back and try to push your back into the towel. Hold this position for __________ seconds. Let your muscles relax completely before you repeat this exercise. Repeat __________ times. Complete this exercise __________ times a day. Alternating arm and leg raises  Get on your hands and knees on a firm  surface. If you are on a hard floor, you may want to use padding, such as an exercise mat, to cushion your knees. Line up your arms and legs. Your hands should be directly below your shoulders, and your knees should be directly below your hips. Lift your left leg behind you. At the same time, raise your right arm and straighten it in front of you. Do not lift your leg higher than your hip. Do not lift your arm higher than your shoulder. Keep your abdominal and back muscles tight. Keep your hips facing the ground. Do not arch your back. Keep your balance carefully, and do not hold your breath. Hold this position for __________ seconds. Slowly return to the starting position. Repeat with your right leg and your left arm. Repeat __________ times. Complete this exercise __________ times a day. Abdominal set with straight leg raise  Lie on your back on a firm bed or the floor. Bend one of your knees and keep your other leg straight. Tense your abdominal muscles and lift your straight leg up, 4-6 inches (10-15 cm) off the ground. Keep your abdominal muscles tight and hold this position for __________ seconds. Do not hold your breath. Do not arch your back. Keep it flat against the ground. Keep your abdominal muscles tense as you slowly lower your leg back to the starting position. Repeat with your other leg. Repeat __________ times. Complete this exercise __________ times a day. Single leg lower with bent knees Lie on your back on a firm bed or the floor. Tense your abdominal muscles and lift your feet off the floor, one foot at a time, so your knees and hips are bent in 90-degree angles (right angles). Your knees should be over your hips and your lower legs should be parallel to the floor. Keeping your abdominal muscles tense and your knee bent, slowly lower one of your legs so your toe touches the ground. Lift your leg back up to return to the starting position. Do not hold your breath. Do  not let your back arch. Keep your back flat against the ground. Repeat with your other leg. Repeat __________ times. Complete this exercise __________ times a day. Posture and body mechanics Good posture and healthy body mechanics can help to relieve stress in your body's tissues and joints. Body mechanics refers to the movements and positions of your body while you do your daily activities. Posture is part of body mechanics. Good posture means: Your spine is in its natural S-curve position (neutral). Your shoulders are pulled back slightly. Your head is not tipped forward (neutral). Follow these guidelines to improve your posture and body mechanics in your everyday activities. Standing  When standing, keep your spine neutral and your feet about hip-width apart. Keep a slight bend in your knees. Your ears, shoulders, and hips should line up. When you do a task in which you stand in one place for a long time, place one foot up on a stable object that is 2-4 inches (5-10 cm) high,  such as a footstool. This helps keep your spine neutral. Sitting  When sitting, keep your spine neutral and keep your feet flat on the floor. Use a footrest, if necessary, and keep your thighs parallel to the floor. Avoid rounding your shoulders, and avoid tilting your head forward. When working at a desk or a computer, keep your desk at a height where your hands are slightly lower than your elbows. Slide your chair under your desk so you are close enough to maintain good posture. When working at a computer, place your monitor at a height where you are looking straight ahead and you do not have to tilt your head forward or downward to look at the screen. Resting When lying down and resting, avoid positions that are most painful for you. If you have pain with activities such as sitting, bending, stooping, or squatting, lie in a position in which your body does not bend very much. For example, avoid curling up on your side  with your arms and knees near your chest (fetal position). If you have pain with activities such as standing for a long time or reaching with your arms, lie with your spine in a neutral position and bend your knees slightly. Try the following positions: Lying on your side with a pillow between your knees. Lying on your back with a pillow under your knees. Lifting  When lifting objects, keep your feet at least shoulder-width apart and tighten your abdominal muscles. Bend your knees and hips and keep your spine neutral. It is important to lift using the strength of your legs, not your back. Do not lock your knees straight out. Always ask for help to lift heavy or awkward objects. This information is not intended to replace advice given to you by your health care provider. Make sure you discuss any questions you have with your health care provider. Document Revised: 03/31/2023 Document Reviewed: 02/12/2021 Elsevier Patient Education  2024 [employer]  Sacroiliac Joint Dysfunction  Sacroiliac joint dysfunction is a condition that causes inflammation on one or both sides of the sacroiliac (SI) joint. The SI joint is the joint between two bones of the pelvis called the sacrum and the ilium. The sacrum is the bone at the base of the spine. The ilium is the large bone that forms the hip. This condition causes deep aching or burning pain in the low back. In some cases, the pain may also spread into one or both buttocks, hips, or thighs. What are the causes? This condition may be caused by: Pregnancy. During pregnancy, extra stress is put on the SI joints because the pelvis widens. Injury, such as: Injuries from car crashes. Sports-related injuries. Work-related injuries. Having one leg that is shorter than the other. Conditions that affect the joints, such as: Rheumatoid arthritis. Gout. Psoriatic arthritis. Joint infection (septic arthritis). Sometimes, the cause of SI joint dysfunction is  not known. What are the signs or symptoms? Symptoms of this condition include: Aching or burning pain in the lower back. The pain may also spread to other areas, such as: Buttocks. Groin. Thighs. Muscle spasms in or around the painful areas. Increased pain when standing, walking, running, stair climbing, bending, or lifting. How is this diagnosed? This condition is diagnosed with a physical exam and your medical history. During the exam, the health care provider may move one or both of your legs to different positions to check for pain. Various tests may be done to confirm the diagnosis, including: Imaging tests to  look for other causes of pain. These may include: MRI. CT scan. Bone scan. Diagnostic injection. A numbing medicine is injected into the SI joint using a needle. If your pain is temporarily improved or stopped after the injection, this can indicate that SI joint dysfunction is the problem. How is this treated? Treatment depends on the cause and severity of your condition. Treatment options can be noninvasive and may include: Ice or heat applied to the lower back area after an injury. This may help reduce pain and muscle spasms. Medicines to relieve pain or inflammation or to relax the muscles. Wearing a back brace (sacroiliac brace) to help support the joint while your back is healing. Physical therapy to increase muscle strength around the joint and flexibility at the joint. This may also involve learning proper body positions and ways of moving to relieve stress on the joint. Direct manipulation of the SI joint. Use of a device that provides electrical stimulation to help reduce pain at the joint. Other treatments may include: Injections of steroid medicine into the joint to reduce pain and swelling. Radiofrequency ablation. This treatment uses heat to burn away nerves that are carrying pain messages from the joint. Surgery to put in screws and plates that limit or prevent  joint motion. This is rare. Follow these instructions at home: Medicines Take over-the-counter and prescription medicines only as told by your health care provider. Ask your health care provider if the medicine prescribed to you: Requires you to avoid driving or using machinery. Can cause constipation. You may need to take these actions to prevent or treat constipation: Drink enough fluid to keep your urine pale yellow. Take over-the-counter or prescription medicines. Eat foods that are high in fiber, such as beans, whole grains, and fresh fruits and vegetables. Limit foods that are high in fat and processed sugars, such as fried or sweet foods. If you have a brace: Wear the brace as told by your health care provider. Remove it only as told by your health care provider. Keep the brace clean. If the brace is not waterproof: Do not let it get wet. Cover it with a watertight covering when you take a bath or a shower. Managing pain, stiffness, and swelling     Icing can help with pain and swelling. Heat may help with muscle tension or spasms. Ask your health care provider if you should use ice or heat. If directed, put ice on the affected area: If you have a removable brace, remove it as told by your health care provider. Put ice in a plastic bag. Place a towel between your skin and the bag. Leave the ice on for 20 minutes, 2-3 times a day. Remove the ice if your skin turns bright red. This is very important. If you cannot feel pain, heat, or cold, you have a greater risk of damage to the area. If directed, apply heat to the affected area as often as told by your health care provider. Use the heat source that your health care provider recommends, such as a moist heat pack or a heating pad. Place a towel between your skin and the heat source. Leave the heat on for 20-30 minutes. Remove the heat if your skin turns bright red. This is especially important if you are unable to feel pain, heat,  or cold. You may have a greater risk of getting burned. General instructions Rest as needed. Return to your normal activities as told by your health care provider. Ask your health  care provider what activities are safe for you. Do exercises as told by your health care provider or physical therapist. Keep all follow-up visits. This is important. Contact a health care provider if: Your pain is not controlled with medicine. You have a fever. Your pain is getting worse. Get help right away if: You have weakness, numbness, or tingling in your legs or feet. You lose control of your bladder or bowels. Summary Sacroiliac (SI) joint dysfunction is a condition that causes inflammation on one or both sides of the SI joint. This condition causes deep aching or burning pain in the low back. In some cases, the pain may also spread into one or both buttocks, hips, or thighs. Treatment depends on the cause and severity of your condition. It may include medicines to reduce pain and swelling or to relax muscles. This information is not intended to replace advice given to you by your health care provider. Make sure you discuss any questions you have with your health care provider. Document Revised: 04/06/2020 Document Reviewed: 04/06/2020 Elsevier Patient Education  2024 [employer].

## 2024-02-12 NOTE — Assessment & Plan Note (Signed)
 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  No alarm features.  No radiculopathy.  Start prednisone taper as provided relief in the past . provided muscle relaxant.  Patient will do physical therapy exercises at home.  Patient requests to do x-rays at follow-up visit.

## 2024-03-09 DIAGNOSIS — R7309 Other abnormal glucose: Secondary | ICD-10-CM | POA: Diagnosis not present

## 2024-04-08 ENCOUNTER — Other Ambulatory Visit: Payer: Self-pay | Admitting: Family

## 2024-04-08 DIAGNOSIS — Z1231 Encounter for screening mammogram for malignant neoplasm of breast: Secondary | ICD-10-CM

## 2024-04-08 DIAGNOSIS — R7309 Other abnormal glucose: Secondary | ICD-10-CM | POA: Diagnosis not present

## 2024-04-10 ENCOUNTER — Encounter: Payer: Self-pay | Admitting: Family

## 2024-04-10 DIAGNOSIS — F418 Other specified anxiety disorders: Secondary | ICD-10-CM

## 2024-04-14 ENCOUNTER — Encounter (HOSPITAL_COMMUNITY): Payer: Self-pay

## 2024-04-14 MED ORDER — HYDROXYZINE HCL 10 MG PO TABS: 10.0000 mg | ORAL_TABLET | Freq: Two times a day (BID) | ORAL | 1 refills | Status: AC | PRN

## 2024-04-21 ENCOUNTER — Ambulatory Visit
Admission: RE | Admit: 2024-04-21 | Discharge: 2024-04-21 | Disposition: A | Discharge status: Home / Self Care | Source: Ambulatory Visit | Attending: Family | Admitting: Family

## 2024-04-21 DIAGNOSIS — Z1231 Encounter for screening mammogram for malignant neoplasm of breast: Secondary | ICD-10-CM | POA: Diagnosis not present

## 2024-05-09 DIAGNOSIS — R7309 Other abnormal glucose: Secondary | ICD-10-CM | POA: Diagnosis not present

## 2024-05-17 ENCOUNTER — Ambulatory Visit: Admitting: Family

## 2024-06-08 DIAGNOSIS — R7309 Other abnormal glucose: Secondary | ICD-10-CM | POA: Diagnosis not present

## 2024-07-09 DIAGNOSIS — R7309 Other abnormal glucose: Secondary | ICD-10-CM | POA: Diagnosis not present

## 2024-08-09 DIAGNOSIS — R7309 Other abnormal glucose: Secondary | ICD-10-CM | POA: Diagnosis not present

## 2024-08-17 ENCOUNTER — Encounter: Payer: BC Managed Care – PPO | Admitting: Family

## 2024-08-23 ENCOUNTER — Ambulatory Visit (INDEPENDENT_AMBULATORY_CARE_PROVIDER_SITE_OTHER): Payer: Self-pay | Admitting: Medical

## 2024-08-23 DIAGNOSIS — U071 COVID-19: Secondary | ICD-10-CM

## 2024-08-23 NOTE — Patient Instructions (Addendum)
 It is recommended that you avoid close contact with others until your fever has resolved for at least 24 hours (without taking fever-reducing medicine) and your other symptoms are improving. It is a good idea to wear a mask when around others until your symptoms resolve.  -Rest and stay well hydrated (by drinking water and other liquids).  -Take over-the-counter medicines (i.e. Dayquil, Nyquil, Ibuprofen) to help relieve your symptoms. -For a sore throat/cough, use cough drops/throat lozenges, gargle warm salt water and/or drink warm liquids (like tea with honey). -Send MyChart message to provider, call or schedule in person visit as needed for new/worsening symptoms (i.e. shortness of breath, chest pain, severe/persistent ear or sinus pain) or if symptoms do not improve as discussed with recommended treatment over the next 7 days.

## 2024-08-23 NOTE — Progress Notes (Signed)
 [facility] [address] [place], [place] [address]   Office Visit Note  Patient Name: Miranda Aguilar Date of Birth [id number]  Medical Record number [id number]  Date of Service: 08/23/2024  Chief Complaint  Patient presents with   Covid Positive   Visit conducted by phone.  HPI 63 y.o. female presents with respiratory illness, positive at-home COVID-19 antigen test.  Tested positive for COVID-19 with at home COVID test today. Sx began 08/21/24. Husband is also sick with COVID.  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  Has not had COVID before she knows of.  Has had fatigue, HA, nasal congestion and cough. No myalgias, temp low 99s. Denies SOB or chest pain.  Last COVID booster about a year ago. Has been recovering at home, feels she has been managing her symptoms okay but has questions regarding when she can return to work.   Current Medication:  Outpatient Encounter Medications as of 08/23/2024  Medication Sig   acetaminophen  (TYLENOL ) 500 MG tablet Take 1,000 mg by mouth every 6 (six) hours as needed for moderate pain (pain score 4-6). (Patient not taking: Reported on 02/11/2024)   Cholecalciferol (VITAMIN D ) 2000 units CAPS Take 2,000 Units by mouth daily.   cyclobenzaprine  (FLEXERIL ) 5 MG tablet Take 1-2 tablets (5-10 mg total) by mouth at bedtime as needed for muscle spasms.   hydrOXYzine  (ATARAX ) 10 MG tablet Take 1 tablet (10 mg total) by mouth 2 (two) times daily as needed for anxiety.   ibuprofen (ADVIL,MOTRIN) 200 MG tablet Take 400 mg by mouth every 8 (eight) hours as needed for mild pain or moderate pain.    [DISCONTINUED] predniSONE  (DELTASONE ) 10 MG tablet Take 40 mg by mouth on day 1, then taper 10 mg daily until gone   No facility-administered encounter medications on file as of 08/23/2024.      Medical History: Past Medical History:  Diagnosis Date   Arthritis    Basal cell carcinoma 2019   nose    Chicken pox    Complication of anesthesia    bradycardia (low 40's)during colonoscopy   Constipation    Occasionally   Gallstone ileus (HCC)    GERD (gastroesophageal reflux disease)    Heartburn   Hypertension    01/2015 - lost weight   Obesity      Vital Signs: No vital signs taken. Visit conducted by phone.   Review of Systems  Constitutional:  Positive for fatigue. Negative for chills and fever.  HENT:  Positive for congestion.   Respiratory:  Positive for cough. Negative for shortness of breath.   Cardiovascular:  Negative for chest pain.  Musculoskeletal:  Negative for myalgias.  Neurological:  Positive for headaches.    Physical Exam Visit conducted by phone.  Patient sounds alert and in no apparent distress. Converses normally.   Assessment/Plan: 1. COVID-19 (Primary) Discussed supportive measures and OTC symptomatic treatment. Gave general guidance to continue avoiding close contacts with others until without a fever for at least 24 hours (without taking fever-reducing medicine) and until other symptoms are improving. Encouraged to continue wearing a mask when in close contact with others until symptoms resolve. Advised to call, send message or schedule in person visit as needed for new/worsening sx (i.e. shortness of breath, chest pain, severe/persistent ear or sinus pain) or if symptoms not improving over next 7 days.   General Counseling: giulliana mcroberts understanding of the findings of todays visit and plan of treatment. she  has been encouraged to call the office with any questions or concerns that should arise related to todays visit.    Time spent:20 Minutes    Joen Arts PA-C Physician Assistant

## 2024-09-08 DIAGNOSIS — R7309 Other abnormal glucose: Secondary | ICD-10-CM | POA: Diagnosis not present

## 2024-09-14 ENCOUNTER — Encounter: Payer: Self-pay | Admitting: Family

## 2024-09-14 ENCOUNTER — Ambulatory Visit (INDEPENDENT_AMBULATORY_CARE_PROVIDER_SITE_OTHER): Admitting: Family

## 2024-09-14 ENCOUNTER — Ambulatory Visit

## 2024-09-14 ENCOUNTER — Other Ambulatory Visit (HOSPITAL_COMMUNITY)
Admission: RE | Admit: 2024-09-14 | Discharge: 2024-09-14 | Disposition: A | Discharge status: Home / Self Care | Source: Ambulatory Visit | Attending: Family | Admitting: Family

## 2024-09-14 VITALS — BP 126/82 | HR 66 | Temp 97.9°F | Ht 64.0 in | Wt 268.2 lb

## 2024-09-14 DIAGNOSIS — M19011 Primary osteoarthritis, right shoulder: Secondary | ICD-10-CM | POA: Diagnosis not present

## 2024-09-14 DIAGNOSIS — M25511 Pain in right shoulder: Secondary | ICD-10-CM | POA: Diagnosis not present

## 2024-09-14 DIAGNOSIS — Z1151 Encounter for screening for human papillomavirus (HPV): Secondary | ICD-10-CM | POA: Insufficient documentation

## 2024-09-14 DIAGNOSIS — Z124 Encounter for screening for malignant neoplasm of cervix: Secondary | ICD-10-CM | POA: Diagnosis not present

## 2024-09-14 DIAGNOSIS — M25551 Pain in right hip: Secondary | ICD-10-CM

## 2024-09-14 DIAGNOSIS — Z113 Encounter for screening for infections with a predominantly sexual mode of transmission: Secondary | ICD-10-CM | POA: Diagnosis not present

## 2024-09-14 DIAGNOSIS — Z0000 Encounter for general adult medical examination without abnormal findings: Secondary | ICD-10-CM

## 2024-09-14 DIAGNOSIS — G8929 Other chronic pain: Secondary | ICD-10-CM | POA: Diagnosis not present

## 2024-09-14 LAB — COMPREHENSIVE METABOLIC PANEL WITH GFR
ALT: 27 U/L (ref 0–35)
AST: 23 U/L (ref 0–37)
Albumin: 4.3 g/dL (ref 3.5–5.2)
Alkaline Phosphatase: 61 U/L (ref 39–117)
BUN: 14 mg/dL (ref 6–23)
CO2: 25 meq/L (ref 19–32)
Calcium: 9.5 mg/dL (ref 8.4–10.5)
Chloride: 106 meq/L (ref 96–112)
Creatinine, Ser: 0.5 mg/dL (ref 0.40–1.20)
GFR: 99.84 mL/min (ref >60.00)
Glucose, Bld: 109 mg/dL — ABNORMAL HIGH (ref 70–99)
Potassium: 3.9 meq/L (ref 3.5–5.1)
Sodium: 140 meq/L (ref 135–145)
Total Bilirubin: 0.6 mg/dL (ref 0.2–1.2)
Total Protein: 7.2 g/dL (ref 6.0–8.3)

## 2024-09-14 LAB — CBC WITH DIFFERENTIAL/PLATELET
Basophils Absolute: 0 K/uL (ref 0.0–0.1)
Basophils Relative: 0.9 % (ref 0.0–3.0)
Eosinophils Absolute: 0.1 K/uL (ref 0.0–0.7)
Eosinophils Relative: 2.9 % (ref 0.0–5.0)
HCT: 40.3 % (ref 36.0–46.0)
Hemoglobin: 13.8 g/dL (ref 12.0–15.0)
Lymphocytes Relative: 50.9 % — ABNORMAL HIGH (ref 12.0–46.0)
Lymphs Abs: 1.4 K/uL (ref 0.7–4.0)
MCHC: 34.1 g/dL (ref 30.0–36.0)
MCV: 88.4 fl (ref 78.0–100.0)
Monocytes Absolute: 0.3 K/uL (ref 0.1–1.0)
Monocytes Relative: 9.5 % (ref 3.0–12.0)
Neutro Abs: 1 K/uL — ABNORMAL LOW (ref 1.4–7.7)
Neutrophils Relative %: 35.8 % — ABNORMAL LOW (ref 43.0–77.0)
Platelets: 181 K/uL (ref 150.0–400.0)
RBC: 4.56 Mil/uL (ref 3.87–5.11)
RDW: 12.9 % (ref 11.5–15.5)
WBC: 2.8 K/uL — ABNORMAL LOW (ref 4.0–10.5)

## 2024-09-14 LAB — LIPID PANEL
Cholesterol: 172 mg/dL (ref 0–200)
HDL: 43.7 mg/dL (ref >39.00)
LDL Cholesterol: 109 mg/dL — ABNORMAL HIGH (ref 0–99)
NonHDL: 128.1
Total CHOL/HDL Ratio: 4
Triglycerides: 95 mg/dL (ref 0.0–149.0)
VLDL: 19 mg/dL (ref 0.0–40.0)

## 2024-09-14 LAB — TSH: TSH: 1.08 u[IU]/mL (ref 0.35–5.50)

## 2024-09-14 LAB — HEMOGLOBIN A1C: Hgb A1c MFr Bld: 5.8 % (ref 4.6–6.5)

## 2024-09-14 LAB — VITAMIN D 25 HYDROXY (VIT D DEFICIENCY, FRACTURES): VITD: 36.18 ng/mL (ref 30.00–100.00)

## 2024-09-14 MED ORDER — MELOXICAM 7.5 MG PO TABS: 7.5000 mg | ORAL_TABLET | Freq: Every day | ORAL | 1 refills | Status: DC | PRN

## 2024-09-14 MED ORDER — OMEPRAZOLE 20 MG PO CPDR: 20.0000 mg | DELAYED_RELEASE_CAPSULE | Freq: Every day | ORAL | 3 refills | Status: AC

## 2024-09-14 NOTE — Assessment & Plan Note (Signed)
 D/D AC joint dysfunction, RCT pathology. Pending xr, PT referral and short course of mobic with omeprazole . Counseled on risks of NSAIDs including OTC ibuprofen and mobic. Close follow up.

## 2024-09-14 NOTE — Assessment & Plan Note (Signed)
 Presentation c/w trochanteric bursitis. Referral to PT . Exercises on AVS provided.

## 2024-09-14 NOTE — Progress Notes (Signed)
 Assessment & Plan:  Routine physical examination Assessment & Plan: CBE and pap smear performed. Encouraged exercise.   Orders: -     VITAMIN D  25 Hydroxy (Vit-D Deficiency, Fractures) -     Hemoglobin A1c -     CBC with Differential/Platelet -     Comprehensive metabolic panel with GFR -     Lipid panel -     TSH -     Cytology - PAP  Chronic right shoulder pain Assessment & Plan: D/D AC joint dysfunction, RCT pathology. Pending xr, PT referral and short course of mobic with omeprazole . Counseled on risks of NSAIDs including OTC ibuprofen and mobic. Close follow up.   Orders: -     Omeprazole ; Take 1 capsule (20 mg total) by mouth daily.  Dispense: 30 capsule; Refill: 3 -     Meloxicam; Take 1 tablet (7.5 mg total) by mouth daily as needed for pain.  Dispense: 30 tablet; Refill: 1 -     DG Shoulder Right; Future -     Ambulatory referral to Physical Therapy  Right hip pain Assessment & Plan: Presentation c/w trochanteric bursitis. Referral to PT . Exercises on AVS provided.       Return precautions given.   Risks, benefits, and alternatives of the medications and treatment plan prescribed today were discussed, and patient expressed understanding.   Education regarding symptom management and diagnosis given to patient on AVS either electronically or printed.  No follow-ups on file.  Rollene Northern, FNP  Subjective:    Patient ID: Miranda Aguilar, female    DOB: 1961/03/05, 63 y.o.   MRN: [id number]  CC: Miranda Aguilar is a 63 y.o. female who presents today for physical exam.    HPI: HPI Discussed the use of AI scribe software for clinical note transcription with the patient, who gave verbal consent to proceed.  History of Present Illness   Miranda Aguilar is a 63 year old female who presents with right shoulder and right hip pain.  She experiences right shoulder pain for 4-5 mos.  Being right-hand dominant, the pain affects her ability to perform daily  activities, such as reaching above her head and fastening her bra. The pain is located at the front and back of the shoulder and radiates down to her wrist. It is described as aching, particularly at night, making it difficult to find a comfortable sleeping position. The pain has been present for several years, initially noticed while playing with her dogs, and has worsened since the summer, especially with increased activity such as woodworking. She takes 800 mg of ibuprofen daily, but it is unclear if it alleviates her symptoms. Denies neck pain or numbness.  She complains of episodic right hip pain. The pain is located at the site where the bone protrudes when lying down and is bothersome during walking. She denies any numbness or groin pain and has a history of lower back and sciatica issues, but these are not currently problematic.     H/o BCC  Colorectal Cancer Screening: UTD , Dr Delmar, repeat in 10 years Breast Cancer Screening: Mammogram UTD Cervical Cancer Screening: 08/15/23 NILM, atrophy , benign reparative changes, Neg HPV  Bone Health screening/DEXA for 65+: No increased fracture risk. Defer screening at this time.        Tetanus - UTD        Pneumococcal - Candidate for; declines  Exercise: Gets regular exercise.   Alcohol use:  occassional Smoking/tobacco use: Nonsmoker.  Health Maintenance  Topic Date Due   Pneumococcal Vaccine for age over 7 (1 of 1 - PCV) Never done   Flu Shot  07/09/2024   COVID-19 Vaccine (7 - Moderna risk 2024-25 season) 08/09/2024   Pap Smear  08/14/2024   DTaP/Tdap/Td vaccine (2 - Td or Tdap) 03/19/2025   Colon Cancer Screening  05/21/2025   Breast Cancer Screening  04/21/2026   Hepatitis C Screening  Completed   HIV Screening  Completed   Zoster (Shingles) Vaccine  Completed   Hepatitis B Vaccine  Aged Out   HPV Vaccine  Aged Out   Meningitis B Vaccine  Aged Out    ALLERGIES: Patient has no known allergies.  Current Outpatient  Medications on File Prior to Visit  Medication Sig Dispense Refill   Cholecalciferol (VITAMIN D ) 2000 units CAPS Take 2,000 Units by mouth daily.     cyclobenzaprine  (FLEXERIL ) 5 MG tablet Take 1-2 tablets (5-10 mg total) by mouth at bedtime as needed for muscle spasms. 30 tablet 1   hydrOXYzine  (ATARAX ) 10 MG tablet Take 1 tablet (10 mg total) by mouth 2 (two) times daily as needed for anxiety. 90 tablet 1   Melatonin 5 MG CAPS Take 5 mg by mouth as needed.     acetaminophen  (TYLENOL ) 500 MG tablet Take 1,000 mg by mouth every 6 (six) hours as needed for moderate pain (pain score 4-6). (Patient not taking: Reported on 09/14/2024)     No current facility-administered medications on file prior to visit.    Review of Systems  Constitutional:  Negative for chills, fever and unexpected weight change.  HENT:  Negative for congestion.   Respiratory:  Negative for cough.   Cardiovascular:  Negative for chest pain, palpitations and leg swelling.  Gastrointestinal:  Negative for nausea and vomiting.  Musculoskeletal:  Positive for arthralgias. Negative for back pain and myalgias.  Skin:  Negative for rash.  Neurological:  Negative for numbness and headaches.  Hematological:  Negative for adenopathy.  Psychiatric/Behavioral:  Negative for confusion.       Objective:    BP 126/82   Pulse 66   Temp 97.9 F (36.6 C)   Ht 5' 4 (1.626 m)   Wt 268 lb 3.2 oz (121.7 kg)   LMP 12/16/2017 (Approximate)   SpO2 96%   BMI 46.04 kg/m   BP Readings from Last 3 Encounters:  09/14/24 126/82  02/11/24 138/86  12/02/23 (!) 155/95   Wt Readings from Last 3 Encounters:  09/14/24 268 lb 3.2 oz (121.7 kg)  02/11/24 262 lb 6.4 oz (119 kg)  12/02/23 254 lb (115.2 kg)    Physical Exam Vitals reviewed.  Constitutional:      Appearance: Normal appearance. She is well-developed.  Eyes:     Conjunctiva/sclera: Conjunctivae normal.  Neck:     Thyroid : No thyroid  mass or thyromegaly.  Cardiovascular:      Rate and Rhythm: Normal rate and regular rhythm.     Pulses: Normal pulses.     Heart sounds: Normal heart sounds.  Pulmonary:     Effort: Pulmonary effort is normal.     Breath sounds: Normal breath sounds. No wheezing, rhonchi or rales.  Chest:  Breasts:    Breasts are symmetrical.     Right: No inverted nipple, mass, nipple discharge, skin change or tenderness.     Left: No inverted nipple, mass, nipple discharge, skin change or tenderness.  Abdominal:     General: Bowel sounds are normal. There is no  distension.     Palpations: Abdomen is soft. Abdomen is not rigid. There is no fluid wave or mass.     Tenderness: There is no abdominal tenderness. There is no guarding or rebound.  Genitourinary:    Cervix: No cervical motion tenderness, discharge or friability.     Uterus: Not enlarged, not fixed and not tender.      Adnexa:        Right: No mass, tenderness or fullness.         Left: No mass, tenderness or fullness.       Comments: Pap performed. No CMT. Unable to appreciated ovaries. Musculoskeletal:     Right shoulder: Tenderness present. No swelling or bony tenderness. Normal strength.     Lumbar back: No swelling, edema, spasms, tenderness or bony tenderness. Normal range of motion. Negative right straight leg raise test and negative left straight leg raise test.     Comments: Full range of motion with flexion, tension, lateral side bends. No bony tenderness. No pain, numbness, tingling elicited with single leg raise bilaterally.  Right Shoulder:   No asymmetry of shoulders when comparing right and left. Pain over AC joint. No pain with palpation over glenohumeral joint lines, [place] joint,  or bicipital groove. pain with internal and external rotation. pain with resisted lateral extension .   Negative active painful arc sign. Negative passive arc ( Neer's). Negative drop arm.  Strength and sensation normal BUE's.  Right Hip: No limp or waddling gait. Full ROM with flexion  and hip rotation in flexion.    No pain of lateral hip with  (flexion-abduction-external rotation) test.   Pain with deep palpation of greater trochanter.     Lymphadenopathy:     Head:     Right side of head: No submental, submandibular, tonsillar, preauricular, posterior auricular or occipital adenopathy.     Left side of head: No submental, submandibular, tonsillar, preauricular, posterior auricular or occipital adenopathy.     Cervical: No cervical adenopathy.     Right cervical: No superficial, deep or posterior cervical adenopathy.    Left cervical: No superficial, deep or posterior cervical adenopathy.  Skin:    General: Skin is warm and dry.  Neurological:     Mental Status: She is alert.     Sensory: No sensory deficit.     Deep Tendon Reflexes:     Reflex Scores:      Patellar reflexes are 2+ on the right side and 2+ on the left side.    Comments: Sensation and strength intact bilateral lower extremities.  Psychiatric:        Speech: Speech normal.        Behavior: Behavior normal.        Thought Content: Thought content normal.

## 2024-09-14 NOTE — Assessment & Plan Note (Signed)
CBE and pap smear performed. Encouraged exercise.

## 2024-09-14 NOTE — Patient Instructions (Addendum)
 Referral to physical therapy  STOP Ibuprofen  Trial of mobic Start prilosec to protect your stomach  A couple of points in regards to meloxicam ( Mobic) -  This medication is not intended for daily , long term use. It is a potent anti inflammatory ( NSAID), and my intention is for you take as needed for moderate to severe pain. If you find yourself using daily, please let me know.   Please takes Mobic ( meloxicam) with FOOD since it is an anti-inflammatory as it can cause a GI bleed or ulcer. If you have a history of GI bleed or ulcer, please do NOT take.  Do no take over the counter aleve, motrin, advil, goody's powder for pain as they are also NSAIDs, and they are  in the same class as Mobic  Lastly, we will need to monitor kidney function while on Mobic, and if we were to see any decline in kidney function in the future, we would have to discontinue this medication.     Hip Bursitis Rehab Ask your health care provider which exercises are safe for you. Do exercises exactly as told by your health care provider and adjust them as directed. It is normal to feel mild stretching, pulling, tightness, or discomfort as you do these exercises. Stop right away if you feel sudden pain or your pain gets worse. Do not begin these exercises until told by your health care provider. Stretching exercise This exercise warms up your muscles and joints and improves the movement and flexibility of your hip. This exercise also helps to relieve pain and stiffness. Iliotibial band stretch An iliotibial band is a strong band of muscle tissue that runs from the outer side of your hip to the outer side of your thigh and knee. Lie on your side with your left / right leg in the top position. Bend your left / right knee and grab your ankle. Stretch out your bottom arm to help you balance. Slowly bring your knee back so your thigh is slightly behind your body. Slowly lower your knee toward the floor until you feel a  gentle stretch on the outside of your left / right thigh. If you do not feel a stretch and your knee will not lower more toward the floor, place the heel of your other foot on top of your knee and pull your knee down toward the floor with your foot. Hold this position for __________ seconds. Slowly return to the starting position. Repeat __________ times. Complete this exercise __________ times a day. Strengthening exercises These exercises build strength and endurance in your hip and pelvis. Endurance is the ability to use your muscles for a long time, even after they get tired. Bridge This exercise strengthens the muscles that move your thigh backward (hip extensors). Lie on your back on a firm surface with your knees bent and your feet flat on the floor. Tighten your buttocks muscles and lift your buttocks off the floor until your trunk is level with your thighs. Do not arch your back. You should feel the muscles working in your buttocks and the back of your thighs. If you do not feel these muscles, slide your feet 1-2 inches (2.5-5 cm) farther away from your buttocks. If this exercise is too easy, try doing it with your arms crossed over your chest. Hold this position for __________ seconds. Slowly lower your hips to the starting position. Let your muscles relax completely after each repetition. Repeat __________ times. Complete this exercise  __________ times a day. Squats This exercise strengthens the muscles in front of your thigh and knee (quadriceps). Stand in front of a table, with your feet and knees pointing straight ahead. You may rest your hands on the table for balance but not for support. Slowly bend your knees and lower your hips like you are going to sit in a chair. Keep your weight over your heels, not over your toes. Keep your lower legs upright so they are parallel with the table legs. Do not let your hips go lower than your knees. Do not bend lower than told by your  health care provider. If your hip pain increases, do not bend as low. Hold the squat position for __________ seconds. Slowly push with your legs to return to standing. Do not use your hands to pull yourself to standing. Repeat __________ times. Complete this exercise __________ times a day. Hip hike  Stand sideways on a bottom step. Stand on your left / right leg with your other foot unsupported next to the step. You can hold on to the railing or wall for balance if needed. Keep your knees straight and your torso square. Then lift your left / right hip up toward the ceiling. Hold this position for __________ seconds. Slowly let your left / right hip lower toward the floor, past the starting position. Your foot should get closer to the floor. Do not lean or bend your knees. Repeat __________ times. Complete this exercise __________ times a day. Single leg stand This exercise increases your balance. Without shoes, stand near a railing or in a doorway. You may hold on to the railing or door frame as needed for balance. Squeeze your left / right buttock muscles, then lift up your other foot. Do not let your left / right hip push out to the side. It is helpful to stand in front of a mirror for this exercise so you can watch your hip. Hold this position for __________ seconds. Repeat __________ times. Complete this exercise __________ times a day. This information is not intended to replace advice given to you by your health care provider. Make sure you discuss any questions you have with your health care provider. Document Revised: 11/07/2021 Document Reviewed: 11/07/2021 Elsevier Patient Education  2024 [employer]  Health Maintenance for Postmenopausal Women Menopause is a normal process in which your ability to get pregnant comes to an end. This process happens slowly over many months or years, usually between the ages of 27 and 63. Menopause is complete when you have missed your menstrual  period for 12 months. It is important to talk with your health care provider about some of the most common conditions that affect women after menopause (postmenopausal women). These include heart disease, cancer, and bone loss (osteoporosis). Adopting a healthy lifestyle and getting preventive care can help to promote your health and wellness. The actions you take can also lower your chances of developing some of these common conditions. What are the signs and symptoms of menopause? During menopause, you may have the following symptoms: Hot flashes. These can be moderate or severe. Night sweats. Decrease in sex drive. Mood swings. Headaches. Tiredness (fatigue). Irritability. Memory problems. Problems falling asleep or staying asleep. Talk with your health care provider about treatment options for your symptoms. Do I need hormone replacement therapy? Hormone replacement therapy is effective in treating symptoms that are caused by menopause, such as hot flashes and night sweats. Hormone replacement carries certain risks, especially as you  become older. If you are thinking about using estrogen or estrogen with progestin, discuss the benefits and risks with your health care provider. How can I reduce my risk for heart disease and stroke? The risk of heart disease, heart attack, and stroke increases as you age. One of the causes may be a change in the body's hormones during menopause. This can affect how your body uses dietary fats, triglycerides, and cholesterol. Heart attack and stroke are medical emergencies. There are many things that you can do to help prevent heart disease and stroke. Watch your blood pressure High blood pressure causes heart disease and increases the risk of stroke. This is more likely to develop in people who have high blood pressure readings or are overweight. Have your blood pressure checked: Every 3-5 years if you are 32-68 years of age. Every year if you are 61 years  old or older. Eat a healthy diet  Eat a diet that includes plenty of vegetables, fruits, low-fat dairy products, and lean protein. Do not eat a lot of foods that are high in solid fats, added sugars, or sodium. Get regular exercise Get regular exercise. This is one of the most important things you can do for your health. Most adults should: Try to exercise for at least 150 minutes each week. The exercise should increase your heart rate and make you sweat (moderate-intensity exercise). Try to do strengthening exercises at least twice each week. Do these in addition to the moderate-intensity exercise. Spend less time sitting. Even light physical activity can be beneficial. Other tips Work with your health care provider to achieve or maintain a healthy weight. Do not use any products that contain nicotine or tobacco. These products include cigarettes, chewing tobacco, and vaping devices, such as e-cigarettes. If you need help quitting, ask your health care provider. Know your numbers. Ask your health care provider to check your cholesterol and your blood sugar (glucose). Continue to have your blood tested as directed by your health care provider. Do I need screening for cancer? Depending on your health history and family history, you may need to have cancer screenings at different stages of your life. This may include screening for: Breast cancer. Cervical cancer. Lung cancer. Colorectal cancer. What is my risk for osteoporosis? After menopause, you may be at increased risk for osteoporosis. Osteoporosis is a condition in which bone destruction happens more quickly than new bone creation. To help prevent osteoporosis or the bone fractures that can happen because of osteoporosis, you may take the following actions: If you are 26-32 years old, get at least 1,000 mg of calcium and at least 600 international units (IU) of vitamin D  per day. If you are older than age 35 but younger than age 35, get at  least 1,200 mg of calcium and at least 600 international units (IU) of vitamin D  per day. If you are older than age 30, get at least 1,200 mg of calcium and at least 800 international units (IU) of vitamin D  per day. Smoking and drinking excessive alcohol increase the risk of osteoporosis. Eat foods that are rich in calcium and vitamin D , and do weight-bearing exercises several times each week as directed by your health care provider. How does menopause affect my mental health? Depression may occur at any age, but it is more common as you become older. Common symptoms of depression include: Feeling depressed. Changes in sleep patterns. Changes in appetite or eating patterns. Feeling an overall lack of motivation or enjoyment of  activities that you previously enjoyed. Frequent crying spells. Talk with your health care provider if you think that you are experiencing any of these symptoms. General instructions See your health care provider for regular wellness exams and vaccines. This may include: Scheduling regular health, dental, and eye exams. Getting and maintaining your vaccines. These include: Influenza vaccine. Get this vaccine each year before the flu season begins. Pneumonia vaccine. Shingles vaccine. Tetanus, diphtheria, and pertussis (Tdap) booster vaccine. Your health care provider may also recommend other immunizations. Tell your health care provider if you have ever been abused or do not feel safe at home. Summary Menopause is a normal process in which your ability to get pregnant comes to an end. This condition causes hot flashes, night sweats, decreased interest in sex, mood swings, headaches, or lack of sleep. Treatment for this condition may include hormone replacement therapy. Take actions to keep yourself healthy, including exercising regularly, eating a healthy diet, watching your weight, and checking your blood pressure and blood sugar levels. Get screened for cancer and  depression. Make sure that you are up to date with all your vaccines. This information is not intended to replace advice given to you by your health care provider. Make sure you discuss any questions you have with your health care provider. Document Revised: 04/16/2021 Document Reviewed: 04/16/2021 Elsevier Patient Education  2024 [employer].

## 2024-09-15 LAB — CYTOLOGY - PAP
Chlamydia: NEGATIVE
Comment: NEGATIVE
Comment: NEGATIVE
Comment: NEGATIVE
Comment: NORMAL
Diagnosis: NEGATIVE
High risk HPV: NEGATIVE
Neisseria Gonorrhea: NEGATIVE
Trichomonas: NEGATIVE

## 2024-09-21 ENCOUNTER — Ambulatory Visit: Payer: Self-pay | Admitting: Family

## 2024-09-21 DIAGNOSIS — Z0000 Encounter for general adult medical examination without abnormal findings: Secondary | ICD-10-CM

## 2024-10-05 DIAGNOSIS — M25551 Pain in right hip: Secondary | ICD-10-CM | POA: Diagnosis not present

## 2024-10-05 DIAGNOSIS — R293 Abnormal posture: Secondary | ICD-10-CM | POA: Diagnosis not present

## 2024-10-05 DIAGNOSIS — M25511 Pain in right shoulder: Secondary | ICD-10-CM | POA: Diagnosis not present

## 2024-10-09 DIAGNOSIS — R7309 Other abnormal glucose: Secondary | ICD-10-CM | POA: Diagnosis not present

## 2024-10-11 DIAGNOSIS — M25511 Pain in right shoulder: Secondary | ICD-10-CM | POA: Diagnosis not present

## 2024-10-11 DIAGNOSIS — M25551 Pain in right hip: Secondary | ICD-10-CM | POA: Diagnosis not present

## 2024-10-11 DIAGNOSIS — R293 Abnormal posture: Secondary | ICD-10-CM | POA: Diagnosis not present

## 2024-10-14 DIAGNOSIS — R293 Abnormal posture: Secondary | ICD-10-CM | POA: Diagnosis not present

## 2024-10-14 DIAGNOSIS — M25551 Pain in right hip: Secondary | ICD-10-CM | POA: Diagnosis not present

## 2024-10-14 DIAGNOSIS — M25511 Pain in right shoulder: Secondary | ICD-10-CM | POA: Diagnosis not present

## 2024-10-18 DIAGNOSIS — M25551 Pain in right hip: Secondary | ICD-10-CM | POA: Diagnosis not present

## 2024-10-18 DIAGNOSIS — R293 Abnormal posture: Secondary | ICD-10-CM | POA: Diagnosis not present

## 2024-10-18 DIAGNOSIS — M25511 Pain in right shoulder: Secondary | ICD-10-CM | POA: Diagnosis not present

## 2024-10-21 DIAGNOSIS — M25511 Pain in right shoulder: Secondary | ICD-10-CM | POA: Diagnosis not present

## 2024-10-21 DIAGNOSIS — M25551 Pain in right hip: Secondary | ICD-10-CM | POA: Diagnosis not present

## 2024-10-21 DIAGNOSIS — R293 Abnormal posture: Secondary | ICD-10-CM | POA: Diagnosis not present

## 2024-10-25 DIAGNOSIS — M25551 Pain in right hip: Secondary | ICD-10-CM | POA: Diagnosis not present

## 2024-10-25 DIAGNOSIS — M25511 Pain in right shoulder: Secondary | ICD-10-CM | POA: Diagnosis not present

## 2024-10-25 DIAGNOSIS — R293 Abnormal posture: Secondary | ICD-10-CM | POA: Diagnosis not present

## 2024-10-26 ENCOUNTER — Telehealth: Payer: Self-pay | Admitting: Family

## 2024-10-26 NOTE — Telephone Encounter (Signed)
 Patient need lab orders.

## 2024-10-26 NOTE — Telephone Encounter (Signed)
 Labs are already ordered

## 2024-10-28 DIAGNOSIS — M25511 Pain in right shoulder: Secondary | ICD-10-CM | POA: Diagnosis not present

## 2024-10-28 DIAGNOSIS — R293 Abnormal posture: Secondary | ICD-10-CM | POA: Diagnosis not present

## 2024-10-28 DIAGNOSIS — M25551 Pain in right hip: Secondary | ICD-10-CM | POA: Diagnosis not present

## 2024-11-09 ENCOUNTER — Telehealth: Payer: Self-pay

## 2024-11-09 ENCOUNTER — Other Ambulatory Visit

## 2024-11-09 NOTE — Telephone Encounter (Signed)
 LVM to call back to office to inform pt labs are in she just needs to schedule lab appt

## 2024-11-09 NOTE — Telephone Encounter (Signed)
 Copied from CRM #[id number]. Topic: Clinical - Request for Lab/Test Order >> Nov 09, 2024  9:13 AM Mesmerise C wrote: Reason for CRM: Patient had to cancel her appt for her labs for today needs lab orders to be put in and would like to be rescheduled for 12/8 please call back at [phone] to reschedule

## 2024-11-12 ENCOUNTER — Other Ambulatory Visit: Payer: Self-pay | Admitting: Family

## 2024-11-12 DIAGNOSIS — G8929 Other chronic pain: Secondary | ICD-10-CM

## 2024-11-12 NOTE — Telephone Encounter (Signed)
 Pt has been scheduled for 11/15/24 for labs

## 2024-11-15 ENCOUNTER — Other Ambulatory Visit

## 2025-09-20 ENCOUNTER — Encounter: Admitting: Family
